# Patient Record
Sex: Female | Born: 1965 | Race: White | Hispanic: No | Marital: Single | Smoking: Never smoker
Health system: Southern US, Community
[De-identification: ages and names within clinical notes are randomized; demographics above are authoritative.]

## PROBLEM LIST (undated history)

## (undated) DIAGNOSIS — G809 Cerebral palsy, unspecified: Secondary | ICD-10-CM

## (undated) DIAGNOSIS — G4730 Sleep apnea, unspecified: Secondary | ICD-10-CM

## (undated) DIAGNOSIS — F419 Anxiety disorder, unspecified: Secondary | ICD-10-CM

## (undated) DIAGNOSIS — F79 Unspecified intellectual disabilities: Secondary | ICD-10-CM

## (undated) DIAGNOSIS — N189 Chronic kidney disease, unspecified: Secondary | ICD-10-CM

## (undated) DIAGNOSIS — R32 Unspecified urinary incontinence: Secondary | ICD-10-CM

## (undated) DIAGNOSIS — K635 Polyp of colon: Secondary | ICD-10-CM

## (undated) DIAGNOSIS — K219 Gastro-esophageal reflux disease without esophagitis: Secondary | ICD-10-CM

## (undated) HISTORY — DX: Unspecified urinary incontinence: R32

## (undated) HISTORY — DX: Anxiety disorder, unspecified: F41.9

## (undated) HISTORY — DX: Chronic kidney disease, unspecified: N18.9

## (undated) HISTORY — DX: Gastro-esophageal reflux disease without esophagitis: K21.9

## (undated) HISTORY — PX: SIGMOIDOSCOPY: SUR1295

## (undated) HISTORY — DX: Polyp of colon: K63.5

---

## 1990-10-28 HISTORY — PX: REPAIR QUADRICEPS / HAMSTRING MUSCLE: SUR1204

## 1991-03-29 HISTORY — PX: BACK SURGERY: SHX140

## 2002-10-28 HISTORY — PX: HIP ADDUCTOR TENOTOMY: SHX1747

## 2003-10-15 ENCOUNTER — Other Ambulatory Visit: Payer: Self-pay

## 2003-10-16 ENCOUNTER — Other Ambulatory Visit: Payer: Self-pay

## 2004-10-28 HISTORY — PX: ABDOMINAL HYSTERECTOMY: SHX81

## 2005-03-15 ENCOUNTER — Emergency Department: Payer: Self-pay | Admitting: General Practice

## 2005-05-17 ENCOUNTER — Emergency Department: Payer: Self-pay | Admitting: Emergency Medicine

## 2005-05-24 ENCOUNTER — Emergency Department: Payer: Self-pay | Admitting: Emergency Medicine

## 2005-06-04 ENCOUNTER — Emergency Department: Payer: Self-pay | Admitting: Emergency Medicine

## 2005-06-12 ENCOUNTER — Ambulatory Visit: Payer: Self-pay

## 2005-07-17 ENCOUNTER — Inpatient Hospital Stay: Payer: Self-pay | Admitting: Unknown Physician Specialty

## 2005-09-08 ENCOUNTER — Emergency Department: Payer: Self-pay | Admitting: Emergency Medicine

## 2005-09-26 ENCOUNTER — Emergency Department: Payer: Self-pay | Admitting: Unknown Physician Specialty

## 2005-09-26 IMAGING — CT CT ABD-PELV W/O CM
1 of 2 series · 15 of 32 positions shown, 19 images · non-contrast
Comparison: none

REASON FOR EXAM: (1) PAIN; (2) PAIN
COMMENTS:

[Series 2: stone · axial · 0.55mm/px · z∈[-589,-262]mm · 15 of 123 slices shown, 19 images]
[im 9/123  soft-tissue]
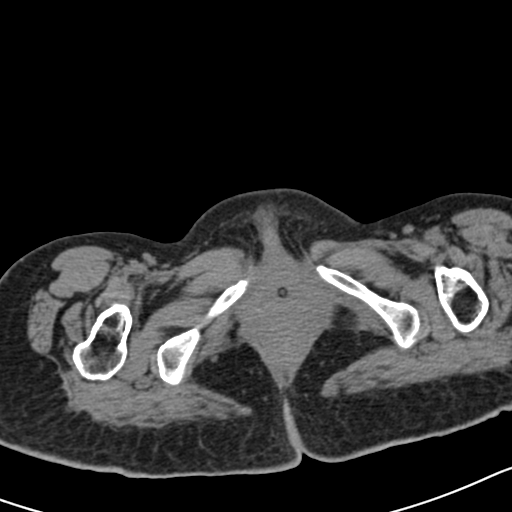
[im 9/123  bone]
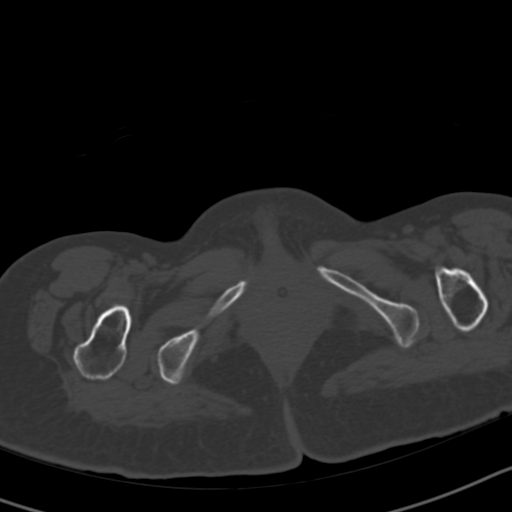
[im 17/123  soft-tissue]
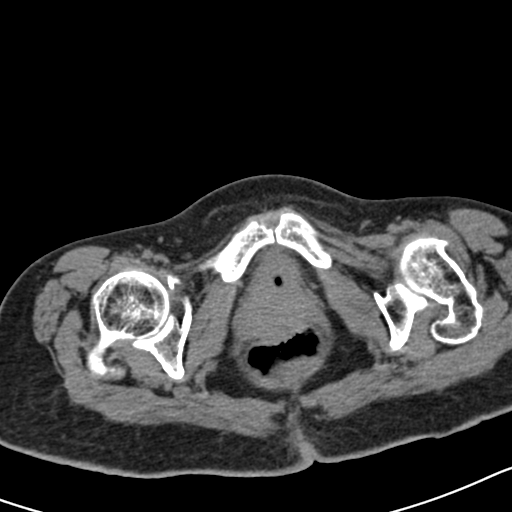
[im 26/123  soft-tissue]
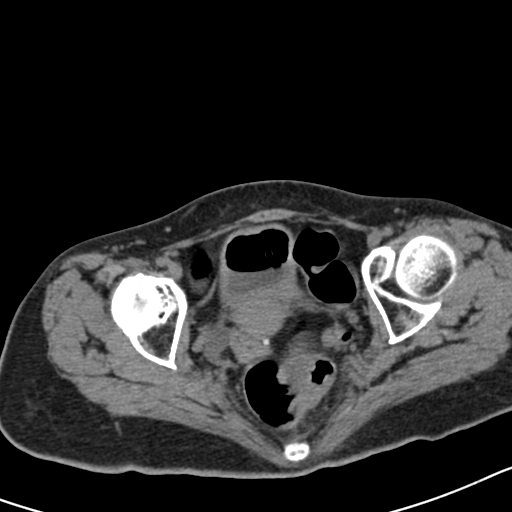
[im 34/123  soft-tissue]
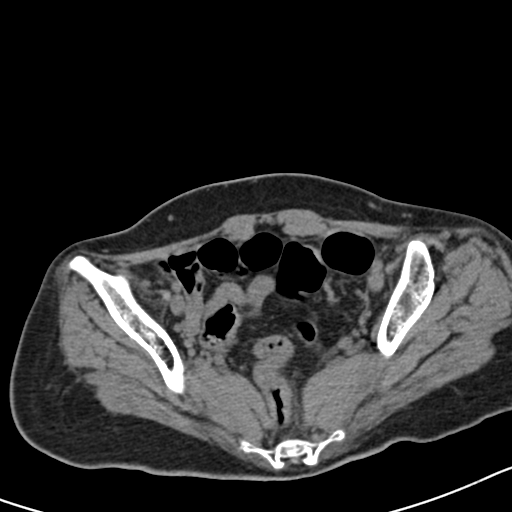
[im 43/123  soft-tissue]
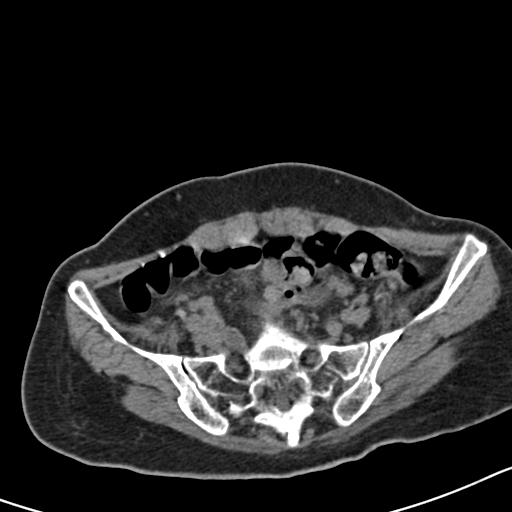
[im 51/123  soft-tissue]
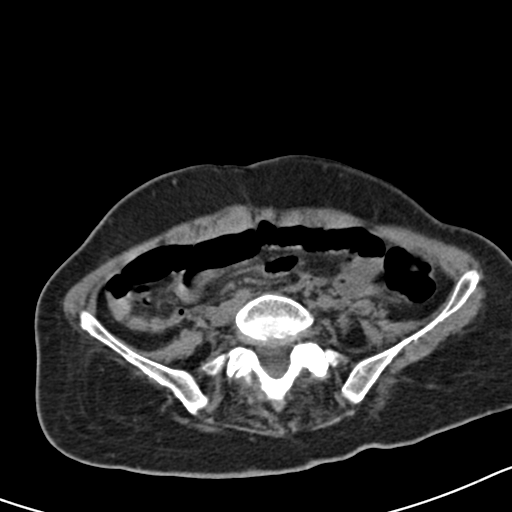
[im 64/123  soft-tissue]
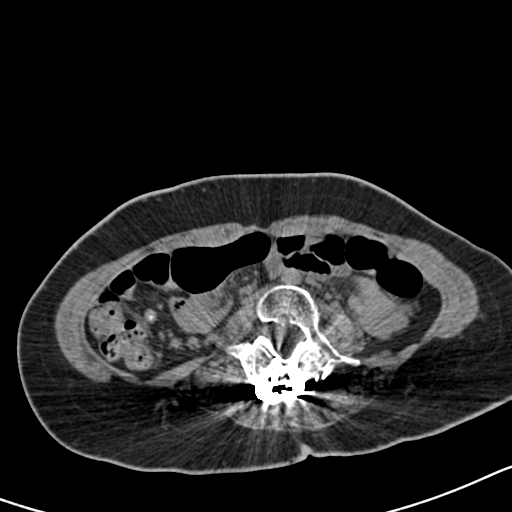
[im 72/123  soft-tissue]
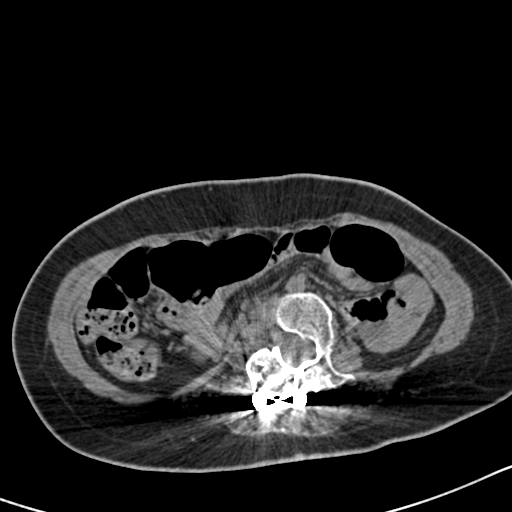
[im 80/123  soft-tissue]
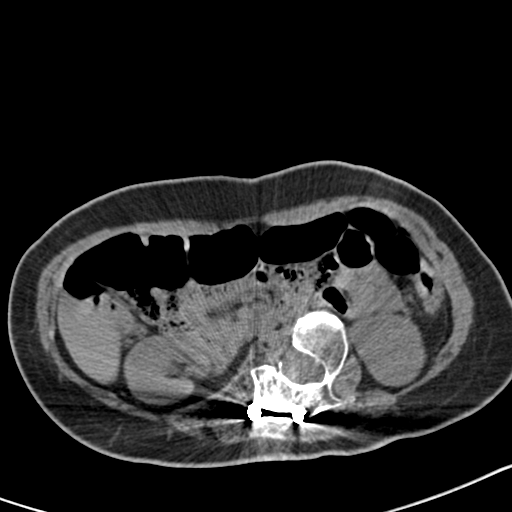
[im 80/123  bone]
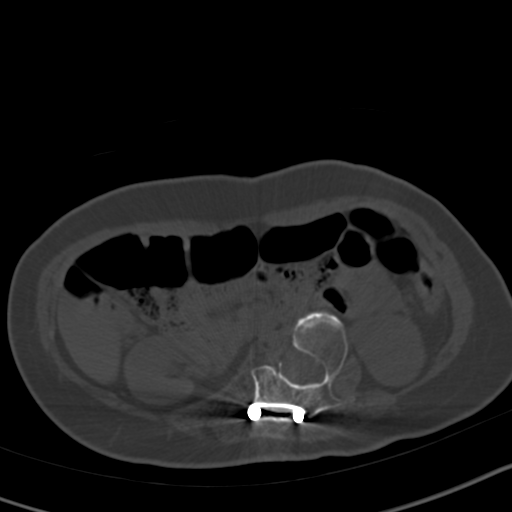
[im 89/123  soft-tissue]
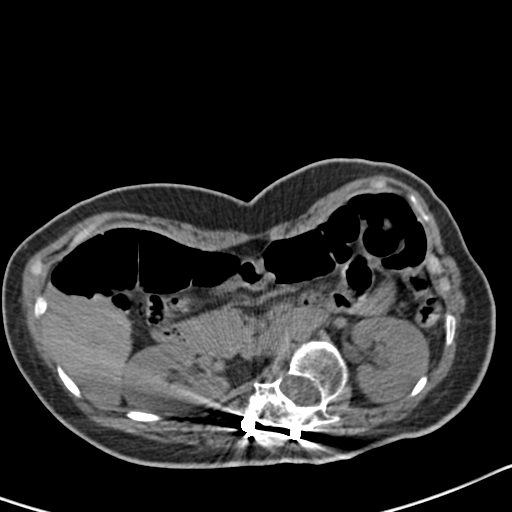
[im 97/123  soft-tissue]
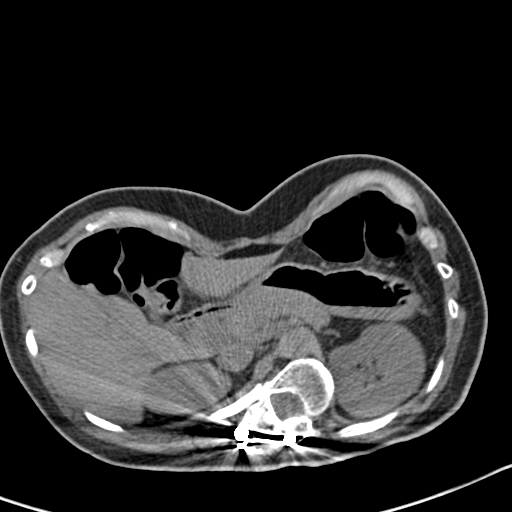
[im 106/123  soft-tissue]
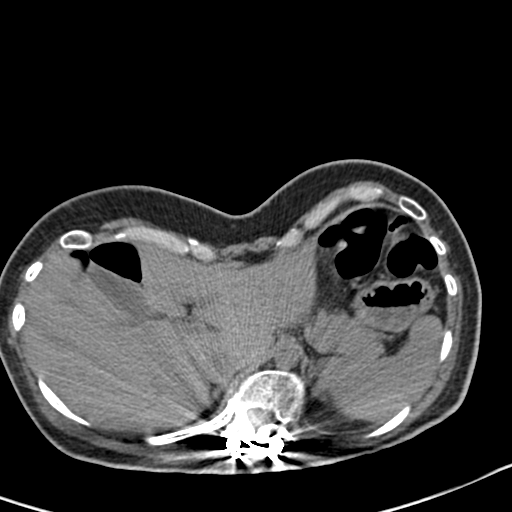
[im 106/123  lung]
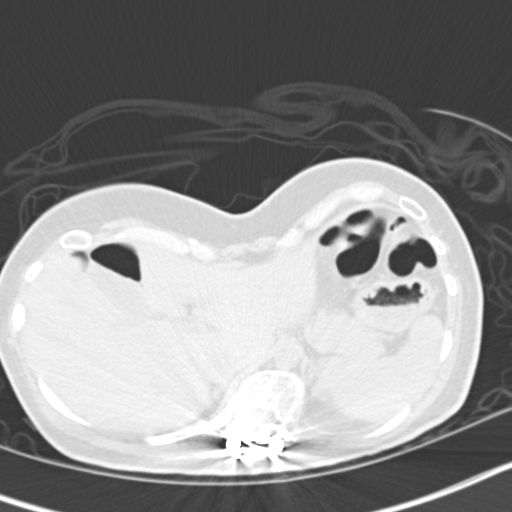
[im 110/123  lung]
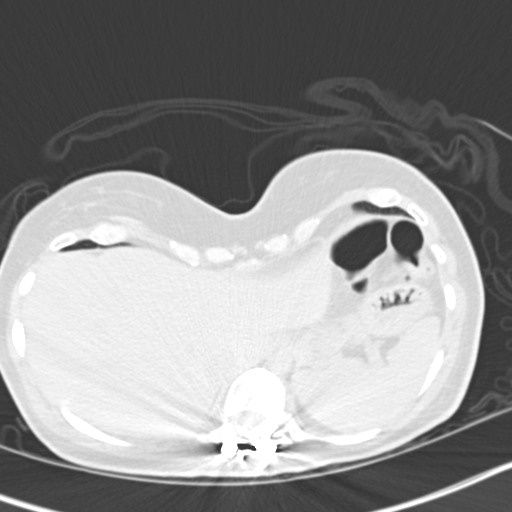
[im 114/123  soft-tissue]
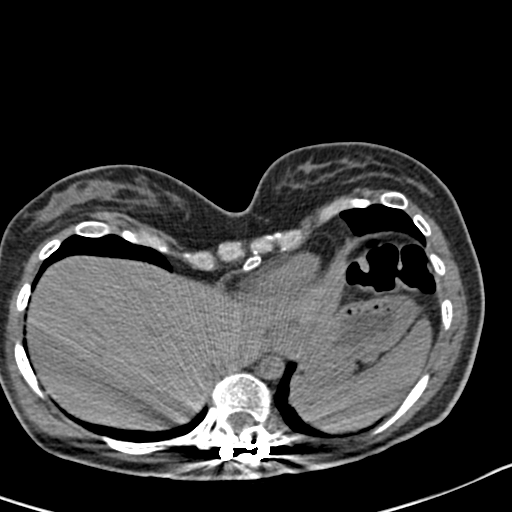
[im 114/123  lung]
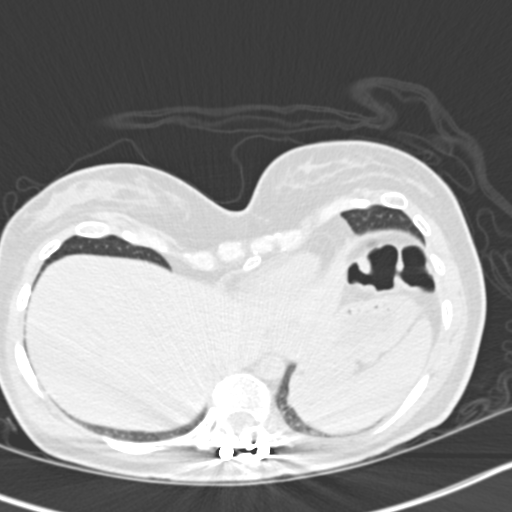
[im 118/123  lung]
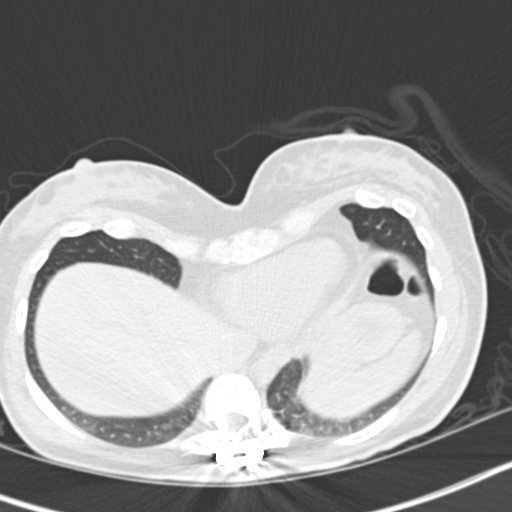

[15 of 32 positions shown; findings below may reference images not displayed]

PROCEDURE:     CT  - CT ABDOMEN AND PELVIS W[DATE]  [DATE]

RESULT:        Standard CT of the abdomen and pelvis was obtained to
evaluate for abdominal pain.  Pectus excavatum deformity is noted of the
sternum.  The liver and spleen are normal.  The pancreas is normal.
Adrenals and kidneys are normal.   Appendicolith is noted in what appears to
be a nonswollen appendix.  No periappendiceal fluid collections are noted.
The abdominal aorta is unremarkable.  Air is noted in the bladder.  This may
be from instrumentation.  The patient has had lumbar spine fusion.
IMPRESSION: 1.     Appendicoliths noted in the appendix.  The appendix is nonswollen.
No periappendiceal fluid collections are noted.
2.     The patient has had lumbar fusion.  No evidence of ureteral stones.
No other focal abnormalities identified.  There is no evidence of bowel
obstruction.

This report was phoned to the Emergency Room physician at the time of the
study.

## 2005-10-06 ENCOUNTER — Emergency Department: Payer: Self-pay | Admitting: Emergency Medicine

## 2006-07-11 ENCOUNTER — Ambulatory Visit: Payer: Self-pay | Admitting: Unknown Physician Specialty

## 2006-08-06 ENCOUNTER — Emergency Department: Payer: Self-pay | Admitting: Emergency Medicine

## 2006-08-06 ENCOUNTER — Other Ambulatory Visit: Payer: Self-pay

## 2006-08-10 ENCOUNTER — Emergency Department: Payer: Self-pay | Admitting: Emergency Medicine

## 2006-08-15 ENCOUNTER — Inpatient Hospital Stay: Payer: Self-pay | Admitting: Internal Medicine

## 2006-08-15 ENCOUNTER — Other Ambulatory Visit: Payer: Self-pay

## 2006-08-23 ENCOUNTER — Inpatient Hospital Stay: Payer: Self-pay | Admitting: Internal Medicine

## 2006-09-21 ENCOUNTER — Emergency Department: Payer: Self-pay | Admitting: Internal Medicine

## 2006-10-10 ENCOUNTER — Ambulatory Visit: Payer: Self-pay | Admitting: Specialist

## 2006-10-22 ENCOUNTER — Emergency Department: Payer: Self-pay | Admitting: General Practice

## 2006-12-28 ENCOUNTER — Emergency Department (HOSPITAL_COMMUNITY): Admission: EM | Admit: 2006-12-28 | Discharge: 2006-12-28 | Payer: Self-pay | Admitting: Emergency Medicine

## 2007-06-30 ENCOUNTER — Emergency Department: Payer: Self-pay | Admitting: Internal Medicine

## 2008-05-20 ENCOUNTER — Other Ambulatory Visit: Payer: Self-pay

## 2008-05-20 ENCOUNTER — Inpatient Hospital Stay: Payer: Self-pay | Admitting: Internal Medicine

## 2008-06-09 ENCOUNTER — Emergency Department: Payer: Self-pay | Admitting: Emergency Medicine

## 2008-07-06 ENCOUNTER — Other Ambulatory Visit: Payer: Self-pay

## 2008-07-06 ENCOUNTER — Emergency Department: Payer: Self-pay | Admitting: Emergency Medicine

## 2008-12-18 ENCOUNTER — Emergency Department (HOSPITAL_COMMUNITY): Admission: EM | Admit: 2008-12-18 | Discharge: 2008-12-18 | Payer: Self-pay | Admitting: Emergency Medicine

## 2009-08-22 ENCOUNTER — Emergency Department: Payer: Self-pay | Admitting: Emergency Medicine

## 2010-01-04 ENCOUNTER — Emergency Department: Payer: Self-pay | Admitting: Unknown Physician Specialty

## 2010-01-11 ENCOUNTER — Emergency Department: Payer: Self-pay | Admitting: Emergency Medicine

## 2010-01-19 ENCOUNTER — Emergency Department: Payer: Self-pay | Admitting: Emergency Medicine

## 2010-02-10 ENCOUNTER — Emergency Department: Payer: Self-pay | Admitting: Emergency Medicine

## 2010-03-09 ENCOUNTER — Emergency Department: Payer: Self-pay | Admitting: Internal Medicine

## 2010-04-15 ENCOUNTER — Emergency Department: Payer: Self-pay

## 2011-01-07 ENCOUNTER — Emergency Department: Payer: Self-pay | Admitting: Emergency Medicine

## 2011-02-12 LAB — URINALYSIS, ROUTINE W REFLEX MICROSCOPIC
Bilirubin Urine: NEGATIVE
Glucose, UA: NEGATIVE mg/dL
Hgb urine dipstick: NEGATIVE
Protein, ur: NEGATIVE mg/dL
Specific Gravity, Urine: 1.015 (ref 1.005–1.030)
Urobilinogen, UA: 0.2 mg/dL (ref 0.0–1.0)

## 2011-02-12 LAB — DIFFERENTIAL
Basophils Absolute: 0.1 10*3/uL (ref 0.0–0.1)
Basophils Relative: 1 % (ref 0–1)
Eosinophils Absolute: 0.1 10*3/uL (ref 0.0–0.7)
Lymphs Abs: 3.5 10*3/uL (ref 0.7–4.0)
Neutro Abs: 2.1 10*3/uL (ref 1.7–7.7)

## 2011-02-12 LAB — CBC
Platelets: 223 10*3/uL (ref 150–400)
WBC: 6.1 10*3/uL (ref 4.0–10.5)

## 2011-02-12 LAB — COMPREHENSIVE METABOLIC PANEL
AST: 48 U/L — ABNORMAL HIGH (ref 0–37)
Albumin: 4.3 g/dL (ref 3.5–5.2)
Alkaline Phosphatase: 71 U/L (ref 39–117)
Chloride: 101 mEq/L (ref 96–112)
GFR calc Af Amer: >60 mL/min (ref >60)
Potassium: 3.9 mEq/L (ref 3.5–5.1)
Sodium: 138 mEq/L (ref 135–145)
Total Bilirubin: 0.9 mg/dL (ref 0.3–1.2)

## 2011-03-01 ENCOUNTER — Ambulatory Visit: Payer: Self-pay | Admitting: Urology

## 2011-03-22 ENCOUNTER — Emergency Department: Payer: Self-pay | Admitting: Emergency Medicine

## 2011-07-17 ENCOUNTER — Ambulatory Visit: Payer: Self-pay | Admitting: Internal Medicine

## 2011-07-17 ENCOUNTER — Other Ambulatory Visit: Payer: Self-pay | Admitting: Internal Medicine

## 2011-09-12 ENCOUNTER — Ambulatory Visit: Payer: Self-pay | Admitting: Gastroenterology

## 2011-12-06 ENCOUNTER — Ambulatory Visit: Payer: Self-pay | Admitting: Family Medicine

## 2012-02-26 ENCOUNTER — Ambulatory Visit: Payer: Self-pay | Admitting: Urology

## 2012-07-24 ENCOUNTER — Emergency Department: Payer: Self-pay | Admitting: Emergency Medicine

## 2012-07-24 LAB — URINALYSIS, COMPLETE
Bilirubin,UR: NEGATIVE
Ketone: NEGATIVE
Nitrite: NEGATIVE
RBC,UR: NONE SEEN /HPF (ref 0–5)
Specific Gravity: 1.012 (ref 1.003–1.030)
Squamous Epithelial: NONE SEEN

## 2012-07-24 LAB — CBC WITH DIFFERENTIAL/PLATELET
Basophil #: 0 10*3/uL (ref 0.0–0.1)
Eosinophil #: 0.1 10*3/uL (ref 0.0–0.7)
HGB: 12.7 g/dL (ref 12.0–16.0)
Lymphocyte %: 58.9 %
MCHC: 33.3 g/dL (ref 32.0–36.0)
Neutrophil %: 32.8 %
RDW: 17 % — ABNORMAL HIGH (ref 11.5–14.5)
WBC: 6.1 10*3/uL (ref 3.6–11.0)

## 2013-02-24 ENCOUNTER — Ambulatory Visit: Payer: Self-pay | Admitting: Urology

## 2013-06-29 ENCOUNTER — Ambulatory Visit: Payer: Self-pay | Admitting: Urology

## 2013-07-15 LAB — HM COLONOSCOPY

## 2014-02-27 ENCOUNTER — Inpatient Hospital Stay: Payer: Self-pay | Admitting: Internal Medicine

## 2014-02-27 LAB — CBC WITH DIFFERENTIAL/PLATELET
Basophil #: 0 10*3/uL (ref 0.0–0.1)
Basophil %: 0.5 %
EOS ABS: 0.1 10*3/uL (ref 0.0–0.7)
EOS PCT: 1.5 %
HCT: 36 % (ref 35.0–47.0)
HGB: 11.9 g/dL — AB (ref 12.0–16.0)
LYMPHS PCT: 47.6 %
Lymphocyte #: 2.9 10*3/uL (ref 1.0–3.6)
MCH: 31.1 pg (ref 26.0–34.0)
MCHC: 33.1 g/dL (ref 32.0–36.0)
MCV: 94 fL (ref 80–100)
MONOS PCT: 7.3 %
Monocyte #: 0.4 x10 3/mm (ref 0.2–0.9)
NEUTROS ABS: 2.6 10*3/uL (ref 1.4–6.5)
Neutrophil %: 43.1 %
Platelet: 217 10*3/uL (ref 150–440)
RBC: 3.82 10*6/uL (ref 3.80–5.20)
RDW: 13.7 % (ref 11.5–14.5)
WBC: 6.1 10*3/uL (ref 3.6–11.0)

## 2014-02-27 LAB — COMPREHENSIVE METABOLIC PANEL
ALK PHOS: 64 U/L
ALT: 17 U/L (ref 12–78)
Albumin: 3.7 g/dL (ref 3.4–5.0)
Anion Gap: 7 (ref 7–16)
BUN: 11 mg/dL (ref 7–18)
Bilirubin,Total: 0.3 mg/dL (ref 0.2–1.0)
CALCIUM: 8.7 mg/dL (ref 8.5–10.1)
CREATININE: 0.56 mg/dL — AB (ref 0.60–1.30)
Chloride: 109 mmol/L — ABNORMAL HIGH (ref 98–107)
Co2: 26 mmol/L (ref 21–32)
EGFR (African American): >60
Glucose: 89 mg/dL (ref 65–99)
OSMOLALITY: 282 (ref 275–301)
POTASSIUM: 4.1 mmol/L (ref 3.5–5.1)
SGOT(AST): 27 U/L (ref 15–37)
Sodium: 142 mmol/L (ref 136–145)
Total Protein: 6.7 g/dL (ref 6.4–8.2)

## 2014-02-27 LAB — URINALYSIS, COMPLETE
BILIRUBIN, UR: NEGATIVE
BLOOD: NEGATIVE
Bacteria: NONE SEEN
Glucose,UR: NEGATIVE mg/dL (ref 0–75)
KETONE: NEGATIVE
Leukocyte Esterase: NEGATIVE
Nitrite: NEGATIVE
PH: 5 (ref 4.5–8.0)
Protein: NEGATIVE
RBC,UR: <1 /HPF (ref 0–5)
Specific Gravity: 1.026 (ref 1.003–1.030)
Squamous Epithelial: NONE SEEN
WBC UR: <1 /HPF (ref 0–5)

## 2014-02-27 LAB — LIPASE, BLOOD: Lipase: 1276 U/L — ABNORMAL HIGH (ref 73–393)

## 2014-02-28 LAB — CBC WITH DIFFERENTIAL/PLATELET
Basophil #: 0 10*3/uL (ref 0.0–0.1)
Basophil %: 0.4 %
EOS PCT: 1.3 %
Eosinophil #: 0.1 10*3/uL (ref 0.0–0.7)
HCT: 33.6 % — ABNORMAL LOW (ref 35.0–47.0)
HGB: 11.1 g/dL — ABNORMAL LOW (ref 12.0–16.0)
LYMPHS ABS: 3.2 10*3/uL (ref 1.0–3.6)
Lymphocyte %: 49.8 %
MCH: 31.4 pg (ref 26.0–34.0)
MCHC: 32.9 g/dL (ref 32.0–36.0)
MCV: 95 fL (ref 80–100)
MONOS PCT: 5.8 %
Monocyte #: 0.4 x10 3/mm (ref 0.2–0.9)
NEUTROS PCT: 42.7 %
Neutrophil #: 2.7 10*3/uL (ref 1.4–6.5)
Platelet: 193 10*3/uL (ref 150–440)
RBC: 3.52 10*6/uL — ABNORMAL LOW (ref 3.80–5.20)
RDW: 13.7 % (ref 11.5–14.5)
WBC: 6.3 10*3/uL (ref 3.6–11.0)

## 2014-02-28 LAB — COMPREHENSIVE METABOLIC PANEL
ALK PHOS: 57 U/L
ALT: 18 U/L (ref 12–78)
ANION GAP: 7 (ref 7–16)
Albumin: 3.3 g/dL — ABNORMAL LOW (ref 3.4–5.0)
BILIRUBIN TOTAL: 0.4 mg/dL (ref 0.2–1.0)
BUN: 9 mg/dL (ref 7–18)
CHLORIDE: 107 mmol/L (ref 98–107)
CO2: 23 mmol/L (ref 21–32)
Calcium, Total: 8.8 mg/dL (ref 8.5–10.1)
Creatinine: 0.33 mg/dL — ABNORMAL LOW (ref 0.60–1.30)
EGFR (African American): >60
Glucose: 65 mg/dL (ref 65–99)
OSMOLALITY: 271 (ref 275–301)
POTASSIUM: 4 mmol/L (ref 3.5–5.1)
SGOT(AST): 23 U/L (ref 15–37)
Sodium: 137 mmol/L (ref 136–145)
TOTAL PROTEIN: 6 g/dL — AB (ref 6.4–8.2)

## 2014-02-28 LAB — HEMOGLOBIN A1C: Hemoglobin A1C: 4.9 % (ref 4.2–6.3)

## 2014-02-28 LAB — LIPID PANEL
Cholesterol: 102 mg/dL (ref 0–200)
HDL Cholesterol: 41 mg/dL (ref 40–60)
LDL CHOLESTEROL, CALC: 48 mg/dL (ref 0–100)
Triglycerides: 64 mg/dL (ref 0–200)
VLDL Cholesterol, Calc: 13 mg/dL (ref 5–40)

## 2014-02-28 LAB — TRIGLYCERIDES: TRIGLYCERIDES: 64 mg/dL (ref 0–200)

## 2014-02-28 LAB — LIPASE, BLOOD: LIPASE: 295 U/L (ref 73–393)

## 2014-08-03 ENCOUNTER — Emergency Department: Payer: Self-pay | Admitting: Emergency Medicine

## 2014-08-03 LAB — CBC
HCT: 35.6 % (ref 35.0–47.0)
HGB: 11.1 g/dL — ABNORMAL LOW (ref 12.0–16.0)
MCH: 29 pg (ref 26.0–34.0)
MCHC: 31.3 g/dL — ABNORMAL LOW (ref 32.0–36.0)
MCV: 93 fL (ref 80–100)
PLATELETS: 264 10*3/uL (ref 150–440)
RBC: 3.83 10*6/uL (ref 3.80–5.20)
RDW: 14.7 % — AB (ref 11.5–14.5)
WBC: 6.4 10*3/uL (ref 3.6–11.0)

## 2014-08-03 LAB — COMPREHENSIVE METABOLIC PANEL
ALK PHOS: 64 U/L
ANION GAP: 6 — AB (ref 7–16)
AST: 19 U/L (ref 15–37)
Albumin: 3.1 g/dL — ABNORMAL LOW (ref 3.4–5.0)
BUN: 12 mg/dL (ref 7–18)
Bilirubin,Total: <0.1 mg/dL — ABNORMAL LOW (ref 0.2–1.0)
Calcium, Total: 8.7 mg/dL (ref 8.5–10.1)
Chloride: 105 mmol/L (ref 98–107)
Co2: 27 mmol/L (ref 21–32)
Creatinine: 0.46 mg/dL — ABNORMAL LOW (ref 0.60–1.30)
EGFR (African American): >60
EGFR (Non-African Amer.): >60
GLUCOSE: 85 mg/dL (ref 65–99)
Osmolality: 275 (ref 275–301)
POTASSIUM: 4 mmol/L (ref 3.5–5.1)
SGPT (ALT): 19 U/L
Sodium: 138 mmol/L (ref 136–145)
TOTAL PROTEIN: 6.6 g/dL (ref 6.4–8.2)

## 2014-08-03 LAB — LIPASE, BLOOD: Lipase: 130 U/L (ref 73–393)

## 2014-08-03 LAB — URINALYSIS, COMPLETE
BLOOD: NEGATIVE
Bilirubin,UR: NEGATIVE
Glucose,UR: NEGATIVE mg/dL (ref 0–75)
LEUKOCYTE ESTERASE: NEGATIVE
NITRITE: NEGATIVE
Ph: 7 (ref 4.5–8.0)
Protein: NEGATIVE
SPECIFIC GRAVITY: 1.028 (ref 1.003–1.030)
Squamous Epithelial: 6

## 2014-08-03 LAB — TROPONIN I

## 2014-11-23 ENCOUNTER — Emergency Department: Payer: Self-pay | Admitting: Emergency Medicine

## 2014-11-23 LAB — CBC WITH DIFFERENTIAL/PLATELET
BASOS PCT: 0.4 %
Basophil #: 0 10*3/uL (ref 0.0–0.1)
EOS PCT: 0 %
Eosinophil #: 0 10*3/uL (ref 0.0–0.7)
HCT: 38.2 % (ref 35.0–47.0)
HGB: 12.1 g/dL (ref 12.0–16.0)
LYMPHS ABS: 1.8 10*3/uL (ref 1.0–3.6)
Lymphocyte %: 26.5 %
MCH: 28.7 pg (ref 26.0–34.0)
MCHC: 31.6 g/dL — AB (ref 32.0–36.0)
MCV: 91 fL (ref 80–100)
MONO ABS: 0.7 x10 3/mm (ref 0.2–0.9)
Monocyte %: 11 %
Neutrophil #: 4.2 10*3/uL (ref 1.4–6.5)
Neutrophil %: 62.1 %
Platelet: 264 10*3/uL (ref 150–440)
RBC: 4.21 10*6/uL (ref 3.80–5.20)
RDW: 14.6 % — ABNORMAL HIGH (ref 11.5–14.5)
WBC: 6.8 10*3/uL (ref 3.6–11.0)

## 2014-11-23 LAB — BASIC METABOLIC PANEL
Anion Gap: 10 (ref 7–16)
BUN: 39 mg/dL — ABNORMAL HIGH (ref 7–18)
CREATININE: 0.54 mg/dL — AB (ref 0.60–1.30)
Calcium, Total: 9.2 mg/dL (ref 8.5–10.1)
Chloride: 105 mmol/L (ref 98–107)
Co2: 25 mmol/L (ref 21–32)
GLUCOSE: 58 mg/dL — AB (ref 65–99)
Osmolality: 287 (ref 275–301)
Potassium: 4.3 mmol/L (ref 3.5–5.1)
SODIUM: 140 mmol/L (ref 136–145)

## 2014-11-23 LAB — URINALYSIS, COMPLETE
Bilirubin,UR: NEGATIVE
Blood: NEGATIVE
Glucose,UR: NEGATIVE mg/dL (ref 0–75)
Hyaline Cast: 3
LEUKOCYTE ESTERASE: NEGATIVE
Nitrite: NEGATIVE
Ph: 5 (ref 4.5–8.0)
Specific Gravity: 1.028 (ref 1.003–1.030)
Squamous Epithelial: <1

## 2014-11-25 LAB — URINE CULTURE

## 2014-12-09 ENCOUNTER — Emergency Department: Payer: Self-pay | Admitting: Emergency Medicine

## 2015-02-18 NOTE — Discharge Summary (Signed)
PATIENT NAME:  Loretta Savage, Loretta Savage MR#:  [id number] DATE OF BIRTH:  Apr 25, 1966  DATE OF ADMISSION:  02/27/2014 DATE OF DISCHARGE:  05/04/20105  ADMISSION DIAGNOSIS: Abdominal pain with acute pancreatitis.  DISCHARGE DIAGNOSES:  1. Acute pancreatitis.  2. Cerebral palsy.  3. Urinary retention.   CONSULTATIONS: GI.   DISCHARGE LABORATORY DATA: Lipase 295. White blood cells 6.3, hemoglobin 11, hematocrit 34, platelets are 193. Sodium 137, potassium 4.0, chloride 107, bicarbonate 23, BUN 9,  creatinine 0.03, glucose 65.   LFTs were within normal limits.   Triglyceride level was within normal limits at 64.  A1c is  4.9.   Abdominal ultrasound showed no evidence of cholecystitis or gallbladder disease.  HOSPITAL COURSE:  A 49 year old female was nonverbal with cerebral palsy presented with abdominal pain. For further details, please refer to H and P.  1. Abdominal pain with acute pancreatitis. The patient was admitted for acute pancreatitis. Her lipase level was 1200 on admission. She was thought to have pancreatitis. She was treated with supportive care for pancreatitis. Her lipase is normal. GI was consulted. Her abdominal ultrasound was normal. Her triglyceride level is normal. Unclear etiology; however, the patient's abdominal pain has improved. She is nonverbal; however, when she grimaces that means she is in pain and she is having no grimacing at this time. She will continue her outpatient medications and her outpatient diet.  2. Urinary retention. The patient will continue outpatient medications.  3. Cerebral palsy. The patient will continue her outpatient medications.   DISCHARGE MEDICATIONS: 1. Xanax 0.5 mg q.8 hours p.r.n.   2. Sucralfate 1 gram before meals and at bedtime.  3. Docusate sodium 50 mg b.i.d.  4. Montelukast 10 mg at bedtime.  5. Tamsulosin 0.4 mg daily.  6. Elmiron 100 mg t.i.d.  7. Macrobid 100 mg b.i.d.  8. Levocetirizine 5 mg at bedtime.  9. Meloxicam 7.5 mg  daily.  10. Terazosin 2 mg at bedtime.  11. Cetirizine 10 mg daily.  12. Estradiol 0.5 mg daily.  13.   0.1 ophthalmic solution t.i.d.  14. Acetaminophen oxycodone 325/5  q. 6 hours p.r.n. pain #25.   DISCHARGE DIET: Regular mechanical soft diet.   DISCHARGE ACTIVITY: As tolerated.   DISCHARGE FOLLOW-UP: The patient followup with Dr. Brynda Greathouse in one week.   The patient is medically stable for discharge.  TIME SPENT:  35 minutes.   ____________________________ Donell Beers. Benjie Karvonen, MD spm:sg D: 02/28/2014 13:34:44 ET T: 02/28/2014 14:18:55 ET JOB#: [id number]  cc: Dorthie Santini P. Benjie Karvonen, MD, <Dictator> Donell Beers Imre Vecchione MD ELECTRONICALLY SIGNED 02/28/2014 14:45

## 2015-02-18 NOTE — H&P (Signed)
PATIENT NAME:  Loretta Savage, Loretta Savage MR#:  [id number] DATE OF BIRTH:  [birth year]  DATE OF ADMISSION:  02/27/2014  REFERRING PHYSICIAN: Dr. Corky Downs  FAMILY PHYSICIAN: Dr. Brynda Greathouse  REASON FOR ADMISSION: Abdominal pain.   HISTORY OF PRESENT ILLNESS: The patient is a 49 year old female with a history of cerebral palsy with cognitive impairment. Also has history of allergies and asthma. Presents to the Emergency Room with acute abdominal pain. In the Emergency Room, the patient was noted to be in severe distress. She was given morphine, with improvement of her pain. Labs revealed an elevated lipase consistent with pancreatitis. She is now admitted for further evaluation.   PAST MEDICAL HISTORY: 1.  Cerebral palsy.  2.  Scoliosis.  3.  Nephrolithiasis.  4.  Status post hysterectomy.  5.  Status post back surgery.  6.  Environmental allergies.  7.  Asthma/reactive airways disease.   MEDICATIONS: 1.  Carafate 1 gram p.o. before meals and at bedtime.  2.  Singulair 10 mg p.o. daily.  3.  Sertraline 50 mg p.o. daily.  4.  Zofran 8 mg p.o. q. 8 hours p.r.n.  5.  Nasonex 2 puffs in each nostril daily.  6.  Xyzal 5 mg p.o. daily.  7.  Estrace 0.5 mg p.o. daily.  8.  Canasa suppositories 1 gram per rectum at bedtime. 9.  Xanax 0.5 mg p.o. q. 8 hours p.r.n.  10.  Percocet 1 p.o. q. 6 hours p.r.n. pain.   ALLERGIES: PENICILLIN, NEOSPORIN, CORTISPORIN.   SOCIAL HISTORY: Negative for alcohol or tobacco abuse.   FAMILY HISTORY: Positive for diabetes and hypertension.   REVIEW OF SYSTEMS:  Unable to obtain from patient.   PHYSICAL EXAMINATION: GENERAL: The patient currently in no acute distress.  VITAL SIGNS: Remarkable for a blood pressure of 145/63, heart rate 106, respiratory rate 20, temperature 98.3, sat 94% on room air.  HEENT: Normocephalic, atraumatic. Pupils equally round and reactive to light and accommodation. Extraocular movements are intact. Sclerae not icteric. Conjunctivae are clear.   Oropharynx is dry, but clear.  NECK: Supple, without JVD. No adenopathy or thyromegaly is noted.  LUNGS: Reveal scattered rhonchi, without wheezes or rales. No dullness. Respiratory effort is normal.  CARDIAC EXAM: Reveals a rapid rate, with a regular rhythm. Normal S1, S2. No significant rubs or gallops. PMI is nondisplaced. Chest wall is nontender.  ABDOMEN: Soft, but tender diffusely. There is guarding, but no rebound. Normoactive bowel sounds. No organomegaly or masses were appreciated. No hernias or bruits were noted.  EXTREMITIES: Revealed stasis changes, with trace edema. Pulses were 2+ bilaterally.  SKIN: Warm and dry, without rash or lesions.  NEUROLOGIC EXAM: Revealed cranial nerves II through XII grossly intact. Deep tendon reflexes were symmetric.  PSYCHIATRIC: Exam was unable to be performed.   LABORATORY DATA: KUB revealed constipation. Urinalysis was unremarkable. Her white count was 6.1, with a hemoglobin of 11.9. Lipase was 1276, with a glucose of 89, BUN of 11, creatinine of 0.56, with a sodium of 142 and a potassium of 4.1.   ASSESSMENT: 1.  Acute pancreatitis.  2.  Abdominal pain.  3.  Tachycardia.  4.  Anemia of chronic disease.  5.  Constipation.   PLAN: The patient will be admitted to the floor and kept n.p.o. except for ice chips and medications. Begin IV fluids and empiric IV antibiotics. Will obtain an abdominal ultrasound and consult GI. Will check her lipid status, as well as an A1c. Follow up routine labs in the morning. Will use  Colace for constipation. Further treatment and evaluation will depend upon the patient's progress.   Total time spent on this patient was 45 minutes.    ____________________________ Leonie Douglas Doy Hutching, MD jds:mr D: 02/27/2014 19:30:28 ET T: 02/27/2014 20:20:29 ET JOB#: [id number]  cc: Leonie Douglas. Doy Hutching, MD, <Dictator> Mikeal Hawthorne. Brynda Greathouse, MD  Ruari Duggan Lennice Sites MD ELECTRONICALLY SIGNED 02/28/2014 12:42

## 2015-02-18 NOTE — Consult Note (Signed)
Brief Consult Note: Diagnosis: Acute pancreatitis.   Patient was seen by consultant.   Comments: Ms. Burck is a 49 y/o caucasian female with cerebral palsy & mental challenges who presents with 1st episode mild acute pancreatitis.  She has multiple new medications which could be culprit.  No hx ETOH or hypertriglyceridemia.  Lipase now normal.    Plan: 1) Agree with supportive measures 2) Gradually advance diet to liquids 3) triglycerides 4) pt should follow up with Dr Gustavo Lah if another episode Thanks for allowing [place] to participate in her care.  Please see full dictated note. #[id number].  Electronic Signatures: Andria Meuse (NP)  (Signed [phone] 20:31)  Authored: Brief Consult Note   Last Updated: 04-May-15 20:31 by Andria Meuse (NP)

## 2015-02-18 NOTE — Consult Note (Signed)
PATIENT NAME:  Loretta Savage, Loretta Savage MR#:  [id number] DATE OF BIRTH:  June 16, 1966  DATE OF CONSULTATION:  02/28/2014  REFERRING PHYSICIAN:  Dr. Benjie Karvonen CONSULTING PHYSICIAN:  Andria Meuse, NP and Lucilla Lame, MD   PRIMARY CARE PHYSICIAN:  Dr. Brynda Greathouse.  PRIMARY GASTROENTEROLOGIST:  Dr. Gustavo Lah at [facility].   REASON FOR CONSULTATION:  Acute pancreatitis.   HISTORY OF PRESENT ILLNESS:  [name] is a pleasant 49 year old Caucasian female with a history of being mentally challenged and cerebral palsy. She has a history of chronic GERD with last EGD July 2009 by Dr. Gustavo Lah that showed LA grade C esophagitis and gastropathy, as well as a tortuous esophagus. She presented to the Emergency Room with acute abdominal pain. Her mother, who is her caregiver, states that she has a history of interstitial cystitis and had been at [facility] April 27th and had Botox injections that she gets in her arms, and she had had several doses of Tylenol, as well as some hydrocodone for pain. She had had also multiple other medication changes, although her mom cannot describe exactly what medications were new. Her admission lipase was 1276, down to 295 today. Her hemoglobin was 11.9, down to 11.1 today. She has not had any nausea or vomiting today. Denies any diarrhea, fever or chills. Her LFTs are normal. Her ultrasound shows the common bile duct was not visualized, so the exam was limited by the patient's ability to cooperate with positioning and breath holding, but the gallbladder appeared normal. She had a colonoscopy at [facility] in September 2014 by Dr. Henrene Pastor and had polypectomy. Her next colonoscopy is due in 5 years. There is no history of hypertriglyceridemia and she does not drink alcohol. Her mother believes her weight has remained stable.   PAST MEDICAL AND SURGICAL HISTORY: Erosive esophagitis as described above, asthma, allergies, cerebral palsy, mentally challenged, iron deficiency anemia, IBS, renal lithiasis,  interstitial cystitis, urinary tract infections, colonic adenomatous polyps, complete hysterectomy, spinal rod, foot surgery, hip surgery, eye surgery, nose surgery.   MEDICATIONS PRIOR TO ADMISSION: Acetaminophen/oxycodone 325/5 mg q.6 hours p.r.n., alprazolam 0.5 mg q.8 hours p.r.n., cetirizine 10 mg daily, docusate sodium 50 mg b.i.d., Elmiron 100 mg t.i.d., estradiol 0.5 mg daily, levocetirizine 5 mg at bedtime, Macrobid 100 mg b.i.d., meloxicam 7.5 mg daily, montelukast 10 mg at bedtime, olopatadine 0.1 ophthalmic solution t.i.d., sucralfate 1 gram at bedtime, tamsulosin 0.4 mg daily, terazosin 2 mg at bedtime.   ALLERGIES: CORTISPORIN, ITCHING AND RASH. LEVAQUIN, SWELLING AND RASH. NEO/POLY B/HC, ITCHING AND RASH. NEOSPORIN, [employer]. PENICILLIN CAUSES RASH.   FAMILY HISTORY:  Positive for coronary artery disease. No known family history of colorectal carcinoma, liver or chronic GI problems or pancreatic problems.   SOCIAL HISTORY:  She resides with her mom. She does not have any siblings. She goes to a day program from 7:30 to 4:00 and has a home health aide pretty much throughout the day and evening hours.   REVIEW OF SYSTEMS:  Unable to obtain from the patient.   PHYSICAL EXAMINATION: VITAL SIGNS:  Temp 97.6, pulse 92, respirations 18, blood pressure 118/88, O2 sat 95% on room air.  GENERAL:  She is a thin, mentally challenged Caucasian female, who is alert, cooperative, but unable to answer my questions.  HEENT:  Sclerae clear, anicteric. Conjunctivae pink. Oropharynx pink and moist.  NECK:  Supple without any mass or thyromegaly.  CHEST:  Heart regular rate and rhythm. Normal S1, S2 without murmurs, clicks, rubs or gallops.  LUNGS:  Clear to auscultation bilaterally.  ABDOMEN:  Positive bowel sounds x 4. No bruits auscultated. Abdomen is soft, nontender, nondistended, without palpable mass or hepatosplenomegaly. No rebound tenderness or guarding.  EXTREMITIES:  She has  braces on both upper extremities. Lower extremities with feet with external rotation.   LABORATORY STUDIES: Creatinine 0.33, otherwise normal basic metabolic panel. Triglycerides 64. Hemoglobin A1c 4.9. Albumin 3.3, total protein 6, otherwise normal LFTs. White blood cell count 6.3, platelets 193.   IMPRESSION:  Ms. Hieronymus is a 49 year old Caucasian female with cerebral palsy and mental challenges who presents with first episode of mild acute pancreatitis. She has multiple new medications, which could be a culprit. No history of alcohol use or hypertriglyceridemia. Lipase is now normal.  PLAN: 1.  Agree with supportive measures.  2.  Gradually advance diet to liquids.  3.  Check triglycerides.  4.  The patient should follow up with Dr. Gustavo Lah if another episode occurs and consider medications.   Thank you for allowing [place] to participate in the care.    ____________________________ Andria Meuse, NP klj:dmm D: 02/28/2014 20:30:57 ET T: 02/28/2014 21:09:19 ET JOB#: [id number]  cc: Andria Meuse, NP, <Dictator> Lollie Sails, MD Andria Meuse FNP ELECTRONICALLY SIGNED 03/08/2014 11:08

## 2016-12-03 DIAGNOSIS — Z23 Encounter for immunization: Secondary | ICD-10-CM | POA: Diagnosis not present

## 2016-12-13 DIAGNOSIS — R6889 Other general symptoms and signs: Secondary | ICD-10-CM | POA: Diagnosis not present

## 2016-12-26 DIAGNOSIS — G804 Ataxic cerebral palsy: Secondary | ICD-10-CM | POA: Diagnosis not present

## 2017-03-31 DIAGNOSIS — J209 Acute bronchitis, unspecified: Secondary | ICD-10-CM | POA: Diagnosis not present

## 2017-04-05 DIAGNOSIS — J019 Acute sinusitis, unspecified: Secondary | ICD-10-CM | POA: Diagnosis not present

## 2017-04-05 DIAGNOSIS — J209 Acute bronchitis, unspecified: Secondary | ICD-10-CM | POA: Diagnosis not present

## 2017-04-05 DIAGNOSIS — B9689 Other specified bacterial agents as the cause of diseases classified elsewhere: Secondary | ICD-10-CM | POA: Diagnosis not present

## 2017-05-05 DIAGNOSIS — R300 Dysuria: Secondary | ICD-10-CM | POA: Diagnosis not present

## 2017-05-05 DIAGNOSIS — G809 Cerebral palsy, unspecified: Secondary | ICD-10-CM | POA: Diagnosis not present

## 2017-05-05 DIAGNOSIS — G8250 Quadriplegia, unspecified: Secondary | ICD-10-CM | POA: Diagnosis not present

## 2017-08-12 DIAGNOSIS — G804 Ataxic cerebral palsy: Secondary | ICD-10-CM | POA: Diagnosis not present

## 2017-08-25 DIAGNOSIS — G8250 Quadriplegia, unspecified: Secondary | ICD-10-CM | POA: Diagnosis not present

## 2017-08-25 DIAGNOSIS — Z6828 Body mass index (BMI) 28.0-28.9, adult: Secondary | ICD-10-CM | POA: Diagnosis not present

## 2017-08-25 DIAGNOSIS — G809 Cerebral palsy, unspecified: Secondary | ICD-10-CM | POA: Diagnosis not present

## 2017-08-25 DIAGNOSIS — M1990 Unspecified osteoarthritis, unspecified site: Secondary | ICD-10-CM | POA: Diagnosis not present

## 2017-11-24 DIAGNOSIS — G808 Other cerebral palsy: Secondary | ICD-10-CM | POA: Diagnosis not present

## 2017-11-24 DIAGNOSIS — G8250 Quadriplegia, unspecified: Secondary | ICD-10-CM | POA: Diagnosis not present

## 2018-02-04 ENCOUNTER — Ambulatory Visit (INDEPENDENT_AMBULATORY_CARE_PROVIDER_SITE_OTHER): Payer: Medicare Other | Admitting: Family Medicine

## 2018-02-04 ENCOUNTER — Encounter: Payer: Self-pay | Admitting: Family Medicine

## 2018-02-04 ENCOUNTER — Other Ambulatory Visit: Payer: Self-pay | Admitting: Family Medicine

## 2018-02-04 VITALS — BP 118/70 | HR 70 | Wt 114.0 lb

## 2018-02-04 DIAGNOSIS — G800 Spastic quadriplegic cerebral palsy: Secondary | ICD-10-CM

## 2018-02-04 DIAGNOSIS — F99 Mental disorder, not otherwise specified: Secondary | ICD-10-CM | POA: Diagnosis not present

## 2018-02-04 DIAGNOSIS — F419 Anxiety disorder, unspecified: Secondary | ICD-10-CM

## 2018-02-04 DIAGNOSIS — K219 Gastro-esophageal reflux disease without esophagitis: Secondary | ICD-10-CM

## 2018-02-04 DIAGNOSIS — N3010 Interstitial cystitis (chronic) without hematuria: Secondary | ICD-10-CM | POA: Insufficient documentation

## 2018-02-04 DIAGNOSIS — M150 Primary generalized (osteo)arthritis: Secondary | ICD-10-CM

## 2018-02-04 DIAGNOSIS — F5105 Insomnia due to other mental disorder: Secondary | ICD-10-CM

## 2018-02-04 DIAGNOSIS — F79 Unspecified intellectual disabilities: Secondary | ICD-10-CM

## 2018-02-04 DIAGNOSIS — Z79899 Other long term (current) drug therapy: Secondary | ICD-10-CM

## 2018-02-04 DIAGNOSIS — M159 Polyosteoarthritis, unspecified: Secondary | ICD-10-CM | POA: Insufficient documentation

## 2018-02-04 DIAGNOSIS — Z7689 Persons encountering health services in other specified circumstances: Secondary | ICD-10-CM | POA: Diagnosis not present

## 2018-02-04 DIAGNOSIS — R799 Abnormal finding of blood chemistry, unspecified: Secondary | ICD-10-CM

## 2018-02-04 DIAGNOSIS — F72 Severe intellectual disabilities: Secondary | ICD-10-CM | POA: Insufficient documentation

## 2018-02-04 DIAGNOSIS — J302 Other seasonal allergic rhinitis: Secondary | ICD-10-CM | POA: Diagnosis not present

## 2018-02-04 DIAGNOSIS — G4700 Insomnia, unspecified: Secondary | ICD-10-CM | POA: Insufficient documentation

## 2018-02-04 DIAGNOSIS — D509 Iron deficiency anemia, unspecified: Secondary | ICD-10-CM

## 2018-02-04 MED ORDER — ALPRAZOLAM 0.5 MG PO TABS: 0.5000 mg | ORAL_TABLET | Freq: Two times a day (BID) | ORAL | 5 refills | Status: DC

## 2018-02-04 MED ORDER — MONTELUKAST SODIUM 10 MG PO TABS: 10.0000 mg | ORAL_TABLET | Freq: Every day | ORAL | 11 refills | Status: DC

## 2018-02-04 MED ORDER — DOXAZOSIN MESYLATE 2 MG PO TABS: 2.0000 mg | ORAL_TABLET | Freq: Every day | ORAL | 11 refills | Status: DC

## 2018-02-04 MED ORDER — MELOXICAM 15 MG PO TABS: ORAL_TABLET | ORAL | 11 refills | Status: DC

## 2018-02-04 MED ORDER — ELMIRON 100 MG PO CAPS: ORAL_CAPSULE | ORAL | 5 refills | Status: DC

## 2018-02-04 MED ORDER — TRAZODONE HCL 50 MG PO TABS: 50.0000 mg | ORAL_TABLET | Freq: Every day | ORAL | 11 refills | Status: DC

## 2018-02-04 NOTE — Assessment & Plan Note (Signed)
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

## 2018-02-04 NOTE — Assessment & Plan Note (Signed)
Refill meds, singulair flonase

## 2018-02-04 NOTE — Assessment & Plan Note (Signed)
Chronic problem, in setting of congenital cerebral palsy Followed by [facility] for botox injections q 4 months Non ambulatory, Has brace supports for legs, s/p prior surgeries for muscle tenotomy Cognitive and intellectual developmental delay as well in setting of CP Continues multi symptom management on meds 

## 2018-02-04 NOTE — Assessment & Plan Note (Addendum)
Stable chronic problem Followed by [facility] Controlled on Carafate and Dexilant

## 2018-02-04 NOTE — Assessment & Plan Note (Signed)
Chronic problem, multiple joints including shoulders Secondary to underlying developmental delay / disorder and CP Refilled Meloxicam 15mg  daily Followed by [facility]

## 2018-02-04 NOTE — Assessment & Plan Note (Signed)
Relatively stable, controlled on BDZ daily Secondary to developmental / intellectual delay with CP Refilled Alprazolam today 0.5mg  BID, with refills Follow-up

## 2018-02-04 NOTE — Assessment & Plan Note (Signed)
Relatively stable, controlled on BDZ daily Secondary to developmental / intellectual delay with CP Refilled Trazodone today 50mg  at night, with refills Follow-up

## 2018-02-04 NOTE — Assessment & Plan Note (Addendum)
Currently controlled on Elmiron agent QID Prior work-up from [facility], has had prior dx UTI and nephrolithiasis, ultimately seems to be symptoms secondary to cystitis - Refilled Elmiron 100mg  QID today, rx DAW brand, cost is high but med is effective, has been approved in past - Also on Doxazosin for improve urinary flow

## 2018-02-04 NOTE — Patient Instructions (Addendum)
Thank you for coming to the office today.  All medicines from Dr Brynda Greathouse have been refilled - please make sure pharmacy cancels out his prior orders, to remove excess refills and that they use MY NEW orders - given up to 30 day supply with up to 1 year on most, some including Alprazolam and Elmiron are 30 day supply with 5 refills  Call if need med adjustment or changes or need new rx sent  Keep up with specialists and day program  No changes today  DUE for FASTING BLOOD WORK (no food or drink after midnight before the lab appointment, only water or coffee without cream/sugar on the morning of)  SCHEDULE "Lab Only" visit in the morning at the clinic for lab draw in 6 MONTHS  - Make sure Lab Only appointment is at about 1 week before your next appointment, so that results will be available  For Lab Results, once available within 2-3 days of blood draw, you can can log in to MyChart online to view your results and a brief explanation. Also, we can discuss results at next follow-up visit.   Please schedule a Follow-up Appointment to: Return in about 5 months (around 07/07/2018) for Annual Physical.  If you have any other questions or concerns, please feel free to call the office or send a message through [place]. You may also schedule an earlier appointment if necessary.  Additionally, you may be receiving a survey about your experience at our office within a few days to 1 week by e-mail or mail. We value your feedback.  Nobie Putnam, DO [place]

## 2018-02-04 NOTE — Progress Notes (Signed)
Subjective:    Patient ID: Loretta Savage, female    DOB: 1966/01/23, 52 y.o.   MRN: [id number]  Loretta Savage is a 52 y.o. female presenting on 02/04/2018 for Establish Care and Cerebral Palsy  Previously followed by Dr Nicky Pugh for PCP locally, who has retired. Now she is here to establish care with new provider. Patient is non verbal. She is unable to provide history. Majority of history is provided by her mother, and legal guardian, Indianna Boran.  HPI   Cerebral Palsy, Spastic quadriplegia / Intellectual Disability - Background history provided by patient's mother today. Reports chronic developmental disorder since birth with cerebral palsy and intellectual disability. She has always had limited verbal speech, but is able to occasionally say select words, such as "mama". She does have intact vision and hearing, but limited ability to follow / comprehend commands. She has short stature congenitally with limited arms, and she has had complications with spastic muscle tone in lower extremity with contractures in past, requiring surgeries, see history. - Currently she lives at home with her mother, who is primary caregiver. 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 She has been attending day program since age 35-4, currently goes to [facility] weekdays in [place], day program with variety of opportunities for her  INSOMNIA / Anxiety - reports chronic issues that seem secondary to her developmental disorder. She will occasionally get anxious and has had problems with this in past, her prior PCP had her on a stable regimen with Alprazolam 0.5mg  BID, not taking PRN, seems to  take most days. Also with Trazodone 50mg  nightly, in past was on higher dose up to 1.5 for 75mg  at times, limited change so went back to 50mg  - Needs refills today on Alprazolam and Trazodone for future  Constipation Stool softener OTC Docusate in AM and nightly with Laxative Bisacodyl  Seasonal Environmental Allergies - Required shots before, now improved on singulair and flonase. Needs refill  Osteoarthritis multiple joints Reports that this may be due to her developmental delay and short stature, has had arthritis changes in L shoulder and other joints. Has been stable on NSAID, meloxicam - Prior followed by [facility] - History of Scoliosis (C curve), see surgery for other muscle surgeries  History of PUD with bleeding ulcer / GERD Followed by [facility]. Has had colonoscopy x 2, last done in 2014 with polyp  History of Chronic Interstitial Bladder Cystitis - History of recurrent issues due to urinary tract, had seen Urologist at [facility] for while, ruled out infection and other causes, ultimately prior PCP tried her on Elmiron with good results, and has continued this longer term.  History of a DVT behind knee - determined she had a sore knee and pain, she had testing identified DVT, was on lovenox then warfarin for 1 year, has since resolved, thought to be provoked due to sedentary.  Other specialists - Dr Tami Ribas - ENT - Dr Nehemiah Massed [facility] (Dermatology) for general mole check  Health Maintenance:  Mammogram would have to go to [place], to lay down to do it, last one 10-15 years ago. They are not ready  to pursue repeat mammogram.  Last colonoscopy entered into [facility], done at [facility]  Depression screen [facility] 2/9 02/04/2018  Decreased Interest 0  Down, Depressed, Hopeless 0  PHQ - 2 Score 0    Past Medical History:  Diagnosis Date  . Anxiety   . Chronic kidney disease    stone  . Colon polyp   . GERD (gastroesophageal reflux disease)   .  Urinary incontinence    Past Surgical History:  Procedure Laterality Date  . ABDOMINAL HYSTERECTOMY  2006  . BACK SURGERY  03/1991  . HIP ADDUCTOR TENOTOMY  2004  . REPAIR QUADRICEPS / HAMSTRING MUSCLE  1992   Social History   Socioeconomic History  . Marital status: Single    Spouse name: Not on file  . Number of children: Not on file  . Years of education: Vocational Program  . Highest education level: Associate degree: occupational, Hotel manager, or vocational program  Occupational History  . Not on file  Social Needs  . Financial resource strain: Not on file  . Food insecurity:    Worry: Not on file    Inability: Not on file  . Transportation needs:    Medical: Not on file    Non-medical: Not on file  Tobacco Use  . Smoking status: Never Smoker  . Smokeless tobacco: Never Used  Substance and Sexual Activity  . Alcohol use: Never    Frequency: Never  . Drug use: Never  . Sexual activity: Never  Lifestyle  . Physical activity:    Days per week: Not on file    Minutes per session: Not on file  . Stress: Not on file  Relationships  . Social connections:    Talks on phone: Not on file    Gets together: Not on file    Attends religious service: Not on file    Active member of club or organization: Not on file    Attends meetings of clubs or organizations: Not on file    Relationship status: Not on file  . Intimate partner violence:    Fear of current or ex partner: Not on file    Emotionally abused: Not on file    Physically abused: Not on file    Forced sexual activity: Not on file  Other Topics Concern  . Not on file  Social History Narrative  . Not on file   Family History  Problem Relation Age of Onset  . Colon polyps Mother   . Heart disease Father   . Heart attack Father   . Thyroid disease Maternal Grandmother   . Heart disease Maternal Grandfather   . Heart disease Paternal Grandmother   . Heart disease Paternal Grandfather    Current Outpatient  Medications on File Prior to Visit  Medication Sig  . acetaminophen (TYLENOL) 500 MG tablet Take by mouth.  Marland Kitchen aspirin 81 MG EC tablet Take by mouth.  . bisacodyl (MAGIC BULLETS) 10 MG suppository Place rectally.  . Calcium-Vitamin D 600-200 MG-UNIT tablet Take by mouth.  . Dexlansoprazole (DEXILANT) 30 MG capsule 30 mg daily.  Marland Kitchen docusate sodium (COLACE CLEAR) 50 MG capsule Take by mouth.  . fluticasone (FLONASE) 50 MCG/ACT nasal spray USE 1 SPRAY INTO EACH NOSTRIL TWICE A DAY  . levocetirizine (XYZAL) 5 MG tablet Take 5 mg by mouth daily.  Marland Kitchen loratadine (CLARITIN) 10 MG tablet Take by mouth.  . sucralfate (CARAFATE) 1 g tablet TAKE 1 TABLET BY MOUTH AT 10:30AM AND 1 TABLET AT  BEDTIME EVERY DAY  . nystatin (MYCOSTATIN) 100000 UNIT/ML suspension    No current facility-administered medications on file prior to visit.     Review of Systems  Constitutional: Negative for activity change, appetite change, chills, diaphoresis, fatigue and fever.  HENT: Negative for congestion and rhinorrhea.   Eyes: Negative for visual disturbance.  Respiratory: Negative for apnea, cough, choking, chest tightness, shortness of breath and wheezing.   Cardiovascular: Negative for palpitations and leg swelling.  Gastrointestinal: Positive for constipation. Negative for abdominal pain, anal bleeding, blood in stool, diarrhea, nausea and vomiting.  Genitourinary: Negative for dysuria, frequency and hematuria.  Musculoskeletal: Positive for arthralgias. Negative for neck pain.  Skin: Negative for rash.  Allergic/Immunologic: Positive for environmental allergies.  Neurological: Negative for dizziness, weakness, light-headedness, numbness and headaches.  Hematological: Negative for adenopathy.  Psychiatric/Behavioral: Positive for sleep disturbance. Negative for agitation, behavioral problems and dysphoric mood. The patient is nervous/anxious.    Per HPI unless specifically indicated above     Objective:    BP  118/70   Pulse 70   Wt 114 lb (51.7 kg) Comment: reported per mother/caregiver, most recent wt, limited by wc  LMP  (LMP Unknown)   Wt Readings from Last 3 Encounters:  02/04/18 114 lb (51.7 kg)    Physical Exam  Constitutional: She appears well-nourished. No distress.  Small stature with some dysmorphic features, comfortable, seated in wheelchair  HENT:  Head: Atraumatic.  Moist mucus mem, unable to get complete view of oropharynx due to limited participation  Eyes: Conjunctivae are normal. Right eye exhibits no discharge. Left eye exhibits no discharge.  Neck: No thyromegaly present.  Cardiovascular: Normal rate, regular rhythm, normal heart sounds and intact distal pulses.  No murmur heard. Pulmonary/Chest: Effort normal and breath sounds normal. No respiratory distress. She has no wheezes. She has no rales.  Exam is limited by participation, not following commands for deep breaths, and position change  Abdominal: Bowel sounds are normal. She exhibits no distension. There is no tenderness.  Musculoskeletal: She exhibits deformity (bilateral lower extremity in rigid braces). She exhibits no edema.  Lymphadenopathy:    She has no cervical adenopathy.  Neurological: She is alert. She exhibits abnormal muscle tone (chronic CP).  Does not follow most commands  Skin: Skin is warm and dry. No rash noted. She is not diaphoretic. No erythema.  Psychiatric:  Well groomed, poor eye contact, non verbal, does not appear agitated  Nursing note and vitals reviewed.    Results for orders placed or performed in visit on 02/04/18  [facility] COLONOSCOPY  Result Value Ref Range   [facility] Colonoscopy See Report (in chart) See Report (in chart), Patient Reported      Assessment & Plan:   Problem List Items Addressed This Visit    Anxiety    Relatively stable, controlled on BDZ daily Secondary to developmental / intellectual delay with CP Refilled Alprazolam today 0.5mg  BID, with refills Follow-up        Relevant Medications   traZODone (DESYREL) 50 MG tablet   ALPRAZolam (XANAX) 0.5 MG tablet   Chronic interstitial cystitis    Currently controlled on Elmiron agent QID Prior work-up from [facility], has had prior dx UTI and nephrolithiasis, ultimately seems to be symptoms secondary to cystitis - Refilled Elmiron 100mg  QID today, rx DAW brand, cost is high but med is effective, has been approved in past - Also on Doxazosin for improve urinary flow       Relevant Medications  ELMIRON 100 MG capsule   doxazosin (CARDURA) 2 MG tablet   GERD (gastroesophageal reflux disease)    Stable chronic problem Followed by [facility] Controlled on Carafate and Dexilant      Relevant Medications   sucralfate (CARAFATE) 1 g tablet   Dexlansoprazole (DEXILANT) 30 MG capsule   docusate sodium (COLACE CLEAR) 50 MG capsule   bisacodyl (MAGIC BULLETS) 10 MG suppository   Insomnia    Relatively stable, controlled on BDZ daily Secondary to developmental / intellectual delay with CP Refilled Trazodone today 50mg  at night, with refills Follow-up      Relevant Medications   traZODone (DESYREL) 50 MG tablet   Intellectual disability    Stable chronic problem, 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      Relevant Medications   ALPRAZolam (XANAX) 0.5 MG tablet   Primary osteoarthritis involving multiple joints    Chronic problem, multiple joints including shoulders Secondary to underlying developmental delay / disorder and CP Refilled Meloxicam 15mg  daily Followed by [facility]      Relevant Medications   aspirin 81 MG EC tablet   acetaminophen (TYLENOL) 500 MG tablet   meloxicam (MOBIC) 15 MG tablet   Seasonal allergies    Refill meds, singulair flonase      Relevant Medications   montelukast (SINGULAIR) 10 MG tablet   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 traZODone (DESYREL) 50 MG tablet    Sig: Take 1 tablet (50 mg total) by mouth at bedtime. For sleep    Dispense:  30 tablet    Refill:  11  . montelukast (SINGULAIR) 10 MG tablet    Sig: Take 1 tablet (10 mg total) by mouth at bedtime. For allergies    Dispense:  30 tablet    Refill:  11  . meloxicam (MOBIC) 15 MG tablet    Sig: TAKE 1 TABLET BY MOUTH DAILY FOR ARTHRITIS    Dispense:  30 tablet    Refill:  11  . ELMIRON 100 MG capsule    Sig: TAKE ONE CAPSULE BY MOUTH 4 TIMES A DAY FOR BLADDER    Dispense:  120 capsule    Refill:  5  . doxazosin (CARDURA) 2 MG tablet    Sig: Take 1 tablet (2 mg total) by mouth at bedtime. For bladder.    Dispense:  30 tablet    Refill:  11  . ALPRAZolam (XANAX) 0.5 MG tablet    Sig: Take 1 tablet (0.5 mg total) by mouth 2 (two) times daily. For anxiety    Dispense:  60 tablet    Refill:  5    Follow up plan: Return in about 5 months (around 07/07/2018) for Annual Physical.  Future labs ordered for 07/09/18  Nobie Putnam, DO [facility] 02/04/2018, 11:59 PM

## 2018-02-19 ENCOUNTER — Other Ambulatory Visit: Payer: Self-pay | Admitting: Family Medicine

## 2018-02-19 DIAGNOSIS — J302 Other seasonal allergic rhinitis: Secondary | ICD-10-CM

## 2018-04-08 DIAGNOSIS — D229 Melanocytic nevi, unspecified: Secondary | ICD-10-CM | POA: Diagnosis not present

## 2018-04-08 DIAGNOSIS — D485 Neoplasm of uncertain behavior of skin: Secondary | ICD-10-CM | POA: Diagnosis not present

## 2018-04-08 DIAGNOSIS — L720 Epidermal cyst: Secondary | ICD-10-CM | POA: Diagnosis not present

## 2018-04-08 DIAGNOSIS — D1800 Hemangioma unspecified site: Secondary | ICD-10-CM | POA: Diagnosis not present

## 2018-04-08 DIAGNOSIS — L578 Other skin changes due to chronic exposure to nonionizing radiation: Secondary | ICD-10-CM | POA: Diagnosis not present

## 2018-04-08 DIAGNOSIS — D225 Melanocytic nevi of trunk: Secondary | ICD-10-CM | POA: Diagnosis not present

## 2018-04-08 DIAGNOSIS — R21 Rash and other nonspecific skin eruption: Secondary | ICD-10-CM | POA: Diagnosis not present

## 2018-04-08 DIAGNOSIS — Z1283 Encounter for screening for malignant neoplasm of skin: Secondary | ICD-10-CM | POA: Diagnosis not present

## 2018-04-23 DIAGNOSIS — K219 Gastro-esophageal reflux disease without esophagitis: Secondary | ICD-10-CM | POA: Diagnosis not present

## 2018-04-23 DIAGNOSIS — H6120 Impacted cerumen, unspecified ear: Secondary | ICD-10-CM | POA: Diagnosis not present

## 2018-05-04 DIAGNOSIS — M1990 Unspecified osteoarthritis, unspecified site: Secondary | ICD-10-CM | POA: Diagnosis not present

## 2018-05-04 DIAGNOSIS — G809 Cerebral palsy, unspecified: Secondary | ICD-10-CM | POA: Diagnosis not present

## 2018-05-04 DIAGNOSIS — Z6828 Body mass index (BMI) 28.0-28.9, adult: Secondary | ICD-10-CM | POA: Diagnosis not present

## 2018-05-25 DIAGNOSIS — G8250 Quadriplegia, unspecified: Secondary | ICD-10-CM | POA: Diagnosis not present

## 2018-05-25 DIAGNOSIS — G809 Cerebral palsy, unspecified: Secondary | ICD-10-CM | POA: Diagnosis not present

## 2018-05-25 DIAGNOSIS — M1990 Unspecified osteoarthritis, unspecified site: Secondary | ICD-10-CM | POA: Diagnosis not present

## 2018-05-26 DIAGNOSIS — K219 Gastro-esophageal reflux disease without esophagitis: Secondary | ICD-10-CM | POA: Diagnosis not present

## 2018-05-26 DIAGNOSIS — Z86010 Personal history of colonic polyps: Secondary | ICD-10-CM | POA: Diagnosis not present

## 2018-05-26 DIAGNOSIS — K5909 Other constipation: Secondary | ICD-10-CM | POA: Diagnosis not present

## 2018-05-27 DIAGNOSIS — G809 Cerebral palsy, unspecified: Secondary | ICD-10-CM | POA: Diagnosis not present

## 2018-05-27 DIAGNOSIS — M1990 Unspecified osteoarthritis, unspecified site: Secondary | ICD-10-CM | POA: Diagnosis not present

## 2018-05-27 DIAGNOSIS — G8250 Quadriplegia, unspecified: Secondary | ICD-10-CM | POA: Diagnosis not present

## 2018-05-29 DIAGNOSIS — M1990 Unspecified osteoarthritis, unspecified site: Secondary | ICD-10-CM | POA: Diagnosis not present

## 2018-05-29 DIAGNOSIS — G809 Cerebral palsy, unspecified: Secondary | ICD-10-CM | POA: Diagnosis not present

## 2018-05-29 DIAGNOSIS — G8250 Quadriplegia, unspecified: Secondary | ICD-10-CM | POA: Diagnosis not present

## 2018-06-01 DIAGNOSIS — G809 Cerebral palsy, unspecified: Secondary | ICD-10-CM | POA: Diagnosis not present

## 2018-06-01 DIAGNOSIS — G8250 Quadriplegia, unspecified: Secondary | ICD-10-CM | POA: Diagnosis not present

## 2018-06-01 DIAGNOSIS — M1990 Unspecified osteoarthritis, unspecified site: Secondary | ICD-10-CM | POA: Diagnosis not present

## 2018-06-03 DIAGNOSIS — M1990 Unspecified osteoarthritis, unspecified site: Secondary | ICD-10-CM | POA: Diagnosis not present

## 2018-06-03 DIAGNOSIS — G8250 Quadriplegia, unspecified: Secondary | ICD-10-CM | POA: Diagnosis not present

## 2018-06-03 DIAGNOSIS — G809 Cerebral palsy, unspecified: Secondary | ICD-10-CM | POA: Diagnosis not present

## 2018-06-05 DIAGNOSIS — G8250 Quadriplegia, unspecified: Secondary | ICD-10-CM | POA: Diagnosis not present

## 2018-06-05 DIAGNOSIS — G809 Cerebral palsy, unspecified: Secondary | ICD-10-CM | POA: Diagnosis not present

## 2018-06-05 DIAGNOSIS — M1990 Unspecified osteoarthritis, unspecified site: Secondary | ICD-10-CM | POA: Diagnosis not present

## 2018-06-08 DIAGNOSIS — G8250 Quadriplegia, unspecified: Secondary | ICD-10-CM | POA: Diagnosis not present

## 2018-06-08 DIAGNOSIS — M1990 Unspecified osteoarthritis, unspecified site: Secondary | ICD-10-CM | POA: Diagnosis not present

## 2018-06-08 DIAGNOSIS — G809 Cerebral palsy, unspecified: Secondary | ICD-10-CM | POA: Diagnosis not present

## 2018-06-10 DIAGNOSIS — G809 Cerebral palsy, unspecified: Secondary | ICD-10-CM | POA: Diagnosis not present

## 2018-06-10 DIAGNOSIS — G8250 Quadriplegia, unspecified: Secondary | ICD-10-CM | POA: Diagnosis not present

## 2018-06-10 DIAGNOSIS — M1990 Unspecified osteoarthritis, unspecified site: Secondary | ICD-10-CM | POA: Diagnosis not present

## 2018-06-12 DIAGNOSIS — G8250 Quadriplegia, unspecified: Secondary | ICD-10-CM | POA: Diagnosis not present

## 2018-06-12 DIAGNOSIS — M1990 Unspecified osteoarthritis, unspecified site: Secondary | ICD-10-CM | POA: Diagnosis not present

## 2018-06-12 DIAGNOSIS — G809 Cerebral palsy, unspecified: Secondary | ICD-10-CM | POA: Diagnosis not present

## 2018-06-15 DIAGNOSIS — G809 Cerebral palsy, unspecified: Secondary | ICD-10-CM | POA: Diagnosis not present

## 2018-06-15 DIAGNOSIS — M1990 Unspecified osteoarthritis, unspecified site: Secondary | ICD-10-CM | POA: Diagnosis not present

## 2018-06-15 DIAGNOSIS — G8250 Quadriplegia, unspecified: Secondary | ICD-10-CM | POA: Diagnosis not present

## 2018-06-18 DIAGNOSIS — G8250 Quadriplegia, unspecified: Secondary | ICD-10-CM | POA: Diagnosis not present

## 2018-06-18 DIAGNOSIS — M1990 Unspecified osteoarthritis, unspecified site: Secondary | ICD-10-CM | POA: Diagnosis not present

## 2018-06-18 DIAGNOSIS — G809 Cerebral palsy, unspecified: Secondary | ICD-10-CM | POA: Diagnosis not present

## 2018-06-19 DIAGNOSIS — G809 Cerebral palsy, unspecified: Secondary | ICD-10-CM | POA: Diagnosis not present

## 2018-06-19 DIAGNOSIS — G8250 Quadriplegia, unspecified: Secondary | ICD-10-CM | POA: Diagnosis not present

## 2018-06-19 DIAGNOSIS — M1990 Unspecified osteoarthritis, unspecified site: Secondary | ICD-10-CM | POA: Diagnosis not present

## 2018-06-22 DIAGNOSIS — G8250 Quadriplegia, unspecified: Secondary | ICD-10-CM | POA: Diagnosis not present

## 2018-06-22 DIAGNOSIS — M1990 Unspecified osteoarthritis, unspecified site: Secondary | ICD-10-CM | POA: Diagnosis not present

## 2018-06-22 DIAGNOSIS — G809 Cerebral palsy, unspecified: Secondary | ICD-10-CM | POA: Diagnosis not present

## 2018-06-24 DIAGNOSIS — G809 Cerebral palsy, unspecified: Secondary | ICD-10-CM | POA: Diagnosis not present

## 2018-06-24 DIAGNOSIS — M1990 Unspecified osteoarthritis, unspecified site: Secondary | ICD-10-CM | POA: Diagnosis not present

## 2018-06-24 DIAGNOSIS — G8250 Quadriplegia, unspecified: Secondary | ICD-10-CM | POA: Diagnosis not present

## 2018-06-26 DIAGNOSIS — G809 Cerebral palsy, unspecified: Secondary | ICD-10-CM | POA: Diagnosis not present

## 2018-06-26 DIAGNOSIS — M1990 Unspecified osteoarthritis, unspecified site: Secondary | ICD-10-CM | POA: Diagnosis not present

## 2018-06-26 DIAGNOSIS — G8250 Quadriplegia, unspecified: Secondary | ICD-10-CM | POA: Diagnosis not present

## 2018-06-29 DIAGNOSIS — G8250 Quadriplegia, unspecified: Secondary | ICD-10-CM | POA: Diagnosis not present

## 2018-06-29 DIAGNOSIS — M1990 Unspecified osteoarthritis, unspecified site: Secondary | ICD-10-CM | POA: Diagnosis not present

## 2018-06-29 DIAGNOSIS — G809 Cerebral palsy, unspecified: Secondary | ICD-10-CM | POA: Diagnosis not present

## 2018-07-01 DIAGNOSIS — G8250 Quadriplegia, unspecified: Secondary | ICD-10-CM | POA: Diagnosis not present

## 2018-07-01 DIAGNOSIS — G809 Cerebral palsy, unspecified: Secondary | ICD-10-CM | POA: Diagnosis not present

## 2018-07-01 DIAGNOSIS — M1990 Unspecified osteoarthritis, unspecified site: Secondary | ICD-10-CM | POA: Diagnosis not present

## 2018-07-06 DIAGNOSIS — G809 Cerebral palsy, unspecified: Secondary | ICD-10-CM | POA: Diagnosis not present

## 2018-07-06 DIAGNOSIS — M1990 Unspecified osteoarthritis, unspecified site: Secondary | ICD-10-CM | POA: Diagnosis not present

## 2018-07-06 DIAGNOSIS — G8250 Quadriplegia, unspecified: Secondary | ICD-10-CM | POA: Diagnosis not present

## 2018-07-09 ENCOUNTER — Other Ambulatory Visit: Payer: Medicare Other

## 2018-07-09 DIAGNOSIS — F99 Mental disorder, not otherwise specified: Secondary | ICD-10-CM | POA: Diagnosis not present

## 2018-07-09 DIAGNOSIS — F5105 Insomnia due to other mental disorder: Secondary | ICD-10-CM | POA: Diagnosis not present

## 2018-07-09 DIAGNOSIS — F419 Anxiety disorder, unspecified: Secondary | ICD-10-CM | POA: Diagnosis not present

## 2018-07-09 DIAGNOSIS — Z79899 Other long term (current) drug therapy: Secondary | ICD-10-CM

## 2018-07-09 DIAGNOSIS — G800 Spastic quadriplegic cerebral palsy: Secondary | ICD-10-CM

## 2018-07-09 DIAGNOSIS — N3010 Interstitial cystitis (chronic) without hematuria: Secondary | ICD-10-CM

## 2018-07-09 DIAGNOSIS — D509 Iron deficiency anemia, unspecified: Secondary | ICD-10-CM

## 2018-07-09 DIAGNOSIS — R799 Abnormal finding of blood chemistry, unspecified: Secondary | ICD-10-CM

## 2018-07-10 LAB — TSH: TSH: 0.79 mIU/L

## 2018-07-10 LAB — HEMOGLOBIN A1C
Hgb A1c MFr Bld: 5.1 % of total Hgb (ref <5.7)
Mean Plasma Glucose: 100 (calc)
eAG (mmol/L): 5.5 (calc)

## 2018-07-10 LAB — COMPLETE METABOLIC PANEL WITH GFR
AG RATIO: 1.6 (calc) (ref 1.0–2.5)
ALT: 13 U/L (ref 6–29)
AST: 25 U/L (ref 10–35)
Albumin: 3.8 g/dL (ref 3.6–5.1)
Alkaline phosphatase (APISO): 89 U/L (ref 33–130)
BILIRUBIN TOTAL: 0.2 mg/dL (ref 0.2–1.2)
BUN / CREAT RATIO: 54 (calc) — AB (ref 6–22)
BUN: 26 mg/dL — ABNORMAL HIGH (ref 7–25)
CALCIUM: 8.8 mg/dL (ref 8.6–10.4)
CO2: 25 mmol/L (ref 20–32)
Chloride: 102 mmol/L (ref 98–110)
Creat: 0.48 mg/dL — ABNORMAL LOW (ref 0.50–1.05)
GFR, EST AFRICAN AMERICAN: 132 mL/min/{1.73_m2} (ref >=60)
GFR, EST NON AFRICAN AMERICAN: 114 mL/min/{1.73_m2} (ref >=60)
GLOBULIN: 2.4 g/dL (ref 1.9–3.7)
Glucose, Bld: 81 mg/dL (ref 65–99)
Potassium: 4.1 mmol/L (ref 3.5–5.3)
SODIUM: 137 mmol/L (ref 135–146)
TOTAL PROTEIN: 6.2 g/dL (ref 6.1–8.1)

## 2018-07-10 LAB — CBC WITH DIFFERENTIAL/PLATELET
BASOS ABS: 8 {cells}/uL (ref 0–200)
Basophils Relative: 0.1 %
EOS ABS: 221 {cells}/uL (ref 15–500)
Eosinophils Relative: 2.8 %
HCT: 32.9 % — ABNORMAL LOW (ref 35.0–45.0)
HEMOGLOBIN: 11 g/dL — AB (ref 11.7–15.5)
Lymphs Abs: 1240 cells/uL (ref 850–3900)
MCH: 29.6 pg (ref 27.0–33.0)
MCHC: 33.4 g/dL (ref 32.0–36.0)
MCV: 88.4 fL (ref 80.0–100.0)
MONOS PCT: 4.8 %
MPV: 11.4 fL (ref 7.5–12.5)
NEUTROS PCT: 76.6 %
Neutro Abs: 6051 cells/uL (ref 1500–7800)
Platelets: 137 10*3/uL — ABNORMAL LOW (ref 140–400)
RBC: 3.72 10*6/uL — ABNORMAL LOW (ref 3.80–5.10)
RDW: 14.3 % (ref 11.0–15.0)
TOTAL LYMPHOCYTE: 15.7 %
WBC mixed population: 379 cells/uL (ref 200–950)
WBC: 7.9 10*3/uL (ref 3.8–10.8)

## 2018-07-10 LAB — LIPID PANEL
CHOLESTEROL: 104 mg/dL (ref <200)
HDL: 58 mg/dL (ref >50)
LDL Cholesterol (Calc): 33 mg/dL (calc)
Non-HDL Cholesterol (Calc): 46 mg/dL (calc) (ref <130)
TRIGLYCERIDES: 54 mg/dL (ref <150)
Total CHOL/HDL Ratio: 1.8 (calc) (ref <5.0)

## 2018-07-14 ENCOUNTER — Encounter: Payer: Medicare Other | Admitting: Family Medicine

## 2018-07-14 DIAGNOSIS — J019 Acute sinusitis, unspecified: Secondary | ICD-10-CM | POA: Diagnosis not present

## 2018-07-15 ENCOUNTER — Encounter: Payer: Medicare Other | Admitting: Family Medicine

## 2018-07-15 ENCOUNTER — Ambulatory Visit (INDEPENDENT_AMBULATORY_CARE_PROVIDER_SITE_OTHER): Payer: Medicare Other | Admitting: Family Medicine

## 2018-07-15 ENCOUNTER — Encounter: Payer: Self-pay | Admitting: Family Medicine

## 2018-07-15 VITALS — BP 106/57 | HR 98 | Temp 98.6°F | Resp 16

## 2018-07-15 DIAGNOSIS — M150 Primary generalized (osteo)arthritis: Secondary | ICD-10-CM | POA: Diagnosis not present

## 2018-07-15 DIAGNOSIS — N3010 Interstitial cystitis (chronic) without hematuria: Secondary | ICD-10-CM | POA: Diagnosis not present

## 2018-07-15 DIAGNOSIS — F79 Unspecified intellectual disabilities: Secondary | ICD-10-CM

## 2018-07-15 DIAGNOSIS — G800 Spastic quadriplegic cerebral palsy: Secondary | ICD-10-CM

## 2018-07-15 DIAGNOSIS — M159 Polyosteoarthritis, unspecified: Secondary | ICD-10-CM

## 2018-07-15 MED ORDER — ELMIRON 100 MG PO CAPS: ORAL_CAPSULE | ORAL | 5 refills | Status: DC

## 2018-07-15 NOTE — Progress Notes (Signed)
Subjective:    Patient ID: Loretta Savage, female    DOB: 1966/04/12, 52 y.o.   MRN: [id number]  Loretta Savage is a 53 y.o. female presenting on 07/15/2018 for Annual Exam  Patient is non verbal. She is unable to provide history. Majority of history is provided by her mother, and legal guardian, [name].  HPI   Lifestyle / Screening - Recent labs show A1c 5.1, and normal fasting sugar. As well as normal cholesterol lipid results. 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 Limited speech and following commands. See prior note and chart for further medical background. - Today no new concerns. 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 She has been attending day program since age 3-4, currently goes to [employer] weekdays in [place], day program with variety of opportunities for her  INSOMNIA / Anxiety - reports chronic issues that seem secondary to her developmental disorder. - Currently remains stable on rare PRN alprazolam 0.5mg  BID and Trazodone 50 mg nightly  Constipation Chronic problem, takes stool softener OTC Docusate in AM and nightly with Laxative Bisacodyl  PMH Seasonal Environmental Allergies  Osteoarthritis multiple joints Reports that this may be due to her developmental delay and short stature, has had arthritis changes in L shoulder and other joints. Has been stable on NSAID, meloxicam - Prior followed by [facility] - History of Scoliosis (C curve), see surgery  for other muscle surgeries  Chronic Interstitial Bladder Cystitis Previously followed by PCP and [facility]. She continues Elmiron with good results, needs bottle instructions changed for proper admin at daycare  Other specialists - Dr Tami Ribas - ENT - Dr Nehemiah Massed [facility] (Dermatology) for general mole check  Health Maintenance:  Due for Flu Shot, ill today with recovering from URI bronchitis, will return 2 weeks after  UTD Colonoscopy  Mammogram would have to go to [place], to lay down to do it, last one 10-15 years ago. They are not ready to pursue repeat mammogram.  Not indicated for pneumonia vaccine at this time.  Depression screen PHQ 2/9 02/04/2018  Decreased Interest 0  Down, Depressed, Hopeless 0  PHQ - 2 Score 0    Past Medical History:  Diagnosis Date  . Anxiety   . Chronic kidney disease    stone  . Colon polyp   . GERD (gastroesophageal reflux disease)   . Urinary incontinence    Past Surgical History:  Procedure Laterality Date  . ABDOMINAL HYSTERECTOMY  2006  . BACK SURGERY  03/1991  . HIP ADDUCTOR TENOTOMY  2004  . REPAIR QUADRICEPS / HAMSTRING MUSCLE  1992   Social History   Socioeconomic History  . Marital status: Single    Spouse name: Not on file  . Number of children: Not on file  . Years of education: Vocational Program  . Highest education level: Associate degree: occupational, Hotel manager, or vocational program  Occupational History  . Not on file  Social Needs  . Financial resource strain: Not on file  .  Food insecurity:    Worry: Not on file    Inability: Not on file  . Transportation needs:    Medical: Not on file    Non-medical: Not on file  Tobacco Use  . Smoking status: Never Smoker  . Smokeless tobacco: Never Used  Substance and Sexual Activity  . Alcohol use: Never    Frequency: Never  . Drug use: Never  . Sexual activity: Never  Lifestyle  . Physical activity:    Days per week: Not on  file    Minutes per session: Not on file  . Stress: Not on file  Relationships  . Social connections:    Talks on phone: Not on file    Gets together: Not on file    Attends religious service: Not on file    Active member of club or organization: Not on file    Attends meetings of clubs or organizations: Not on file    Relationship status: Not on file  . Intimate partner violence:    Fear of current or ex partner: Not on file    Emotionally abused: Not on file    Physically abused: Not on file    Forced sexual activity: Not on file  Other Topics Concern  . Not on file  Social History Narrative  . Not on file   Family History  Problem Relation Age of Onset  . Colon polyps Mother   . Heart disease Father   . Heart attack Father   . Thyroid disease Maternal Grandmother   . Heart disease Maternal Grandfather   . Heart disease Paternal Grandmother   . Heart disease Paternal Grandfather    Current Outpatient Medications on File Prior to Visit  Medication Sig  . acetaminophen (TYLENOL) 500 MG tablet Take by mouth.  . ALPRAZolam (XANAX) 0.5 MG tablet Take 1 tablet (0.5 mg total) by mouth 2 (two) times daily. For anxiety  . aspirin 81 MG EC tablet Take by mouth.  . bisacodyl (MAGIC BULLETS) 10 MG suppository Place rectally.  . Calcium-Vitamin D 600-200 MG-UNIT tablet Take by mouth.  . Dexlansoprazole (DEXILANT) 30 MG capsule 30 mg daily.  Marland Kitchen docusate sodium (COLACE CLEAR) 50 MG capsule Take by mouth.  . doxazosin (CARDURA) 2 MG tablet Take 1 tablet (2 mg total) by mouth at bedtime. For bladder.  . fluticasone (FLONASE) 50 MCG/ACT nasal spray USE 1 SPRAY INTO EACH NOSTRIL TWICE A DAY  . levocetirizine (XYZAL) 5 MG tablet Take 5 mg by mouth daily.  Marland Kitchen loratadine (CLARITIN) 10 MG tablet Take by mouth.  . meloxicam (MOBIC) 15 MG tablet TAKE 1 TABLET BY MOUTH DAILY FOR ARTHRITIS  . montelukast (SINGULAIR) 10 MG tablet Take 1 tablet (10 mg total) by mouth at bedtime. For allergies  .  nystatin (MYCOSTATIN) 100000 UNIT/ML suspension   . sucralfate (CARAFATE) 1 g tablet TAKE 1 TABLET BY MOUTH AT 10:30AM AND 1 TABLET AT BEDTIME EVERY DAY  . traZODone (DESYREL) 50 MG tablet Take 1 tablet (50 mg total) by mouth at bedtime. For sleep   No current facility-administered medications on file prior to visit.     Review of Systems Per HPI unless specifically indicated above  Unable to obtain full ROS due to limited input from patient. - See above HPI also, denies diarrhea, complaints of pain, swelling, behavior change      Objective:    BP (!) 106/57   Pulse 98   Temp 98.6 F (37 C) (Oral)   Resp 16  Wt Readings from Last 3 Encounters:  02/04/18 114 lb (51.7 kg)    Physical Exam  Constitutional: She appears well-nourished. No distress.  Small stature with some dysmorphic features, comfortable, seated in wheelchair  HENT:  Head: Atraumatic.  Moist mucus mem, unable to get complete view of oropharynx due to limited participation  Eyes: Conjunctivae are normal. Right eye exhibits no discharge. Left eye exhibits no discharge.  Neck: No thyromegaly present.  Cardiovascular: Normal rate, regular rhythm, normal heart sounds and intact distal pulses.  No murmur heard. Pulmonary/Chest: Effort normal. No respiratory distress. She has no wheezes. She has no rales.  Exam is limited by participation, not following commands for deep breaths.  Some mild lower respiratory rhonchi that improves. Occasional cough in office. No wheezing or focal crackles.  Abdominal: Bowel sounds are normal. She exhibits no distension. There is no tenderness.  Musculoskeletal: She exhibits deformity (bilateral lower extremity in rigid braces). She exhibits no edema.  Lymphadenopathy:    She has no cervical adenopathy.  Neurological: She is alert. She exhibits abnormal muscle tone (chronic CP).  Does not follow most commands  Skin: Skin is warm and dry. No rash noted. She is not diaphoretic. No  erythema.  Psychiatric:  Well groomed, poor eye contact, non verbal, does not appear agitated  Nursing note and vitals reviewed.  Results for orders placed or performed in visit on 07/09/18  TSH  Result Value Ref Range   TSH 0.79 mIU/L  Lipid panel  Result Value Ref Range   Cholesterol 104 <200 mg/dL   HDL 58 >50 mg/dL   Triglycerides 54 <150 mg/dL   LDL Cholesterol (Calc) 33 mg/dL (calc)   Total CHOL/HDL Ratio 1.8 <5.0 (calc)   Non-HDL Cholesterol (Calc) 46 <130 mg/dL (calc)  COMPLETE METABOLIC PANEL WITH GFR  Result Value Ref Range   Glucose, Bld 81 65 - 99 mg/dL   BUN 26 (H) 7 - 25 mg/dL   Creat 0.48 (L) 0.50 - 1.05 mg/dL   GFR, Est Non African American 114 > OR = 60 mL/min/1.9m2   GFR, Est African American 132 > OR = 60 mL/min/1.71m2   BUN/Creatinine Ratio 54 (H) 6 - 22 (calc)   Sodium 137 135 - 146 mmol/L   Potassium 4.1 3.5 - 5.3 mmol/L   Chloride 102 98 - 110 mmol/L   CO2 25 20 - 32 mmol/L   Calcium 8.8 8.6 - 10.4 mg/dL   Total Protein 6.2 6.1 - 8.1 g/dL   Albumin 3.8 3.6 - 5.1 g/dL   Globulin 2.4 1.9 - 3.7 g/dL (calc)   AG Ratio 1.6 1.0 - 2.5 (calc)   Total Bilirubin 0.2 0.2 - 1.2 mg/dL   Alkaline phosphatase (APISO) 89 33 - 130 U/L   AST 25 10 - 35 U/L   ALT 13 6 - 29 U/L  CBC with Differential/Platelet  Result Value Ref Range   WBC 7.9 3.8 - 10.8 Thousand/uL   RBC 3.72 (L) 3.80 - 5.10 Million/uL   Hemoglobin 11.0 (L) 11.7 - 15.5 g/dL   HCT 32.9 (L) 35.0 - 45.0 %   MCV 88.4 80.0 - 100.0 fL   MCH 29.6 27.0 - 33.0 pg   MCHC 33.4 32.0 - 36.0 g/dL   RDW 14.3 11.0 - 15.0 %   Platelets 137 (L) 140 - 400 Thousand/uL   MPV 11.4 7.5 - 12.5 fL   Neutro Abs 6,051 1,500 - 7,800 cells/uL   Lymphs Abs 1,240 850 - 3,900 cells/uL   WBC  mixed population 379 200 - 950 cells/uL   Eosinophils Absolute 221 15 - 500 cells/uL   Basophils Absolute 8 0 - 200 cells/uL   Neutrophils Relative % 76.6 %   Total Lymphocyte 15.7 %   Monocytes Relative 4.8 %   Eosinophils Relative  2.8 %   Basophils Relative 0.1 %  Hemoglobin A1c  Result Value Ref Range   Hgb A1c MFr Bld 5.1 <5.7 % of total Hgb   Mean Plasma Glucose 100 (calc)   eAG (mmol/L) 5.5 (calc)      Assessment & Plan:   Problem List Items Addressed This Visit    Chronic interstitial cystitis    Currently controlled on Elmiron agent QID Prior work-up from [facility], has had prior dx UTI and nephrolithiasis, ultimately seems to be symptoms secondary to cystitis  Plan - Refilled Elmiron 100mg  corrected dosage times per rx for proper admin, rx DAW brand, cost is high but med is effective, has been approved in past - Also on Doxazosin for improve urinary flow      Relevant Medications   ELMIRON 100 MG capsule   Intellectual disability    Stable chronic problem, 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      Primary osteoarthritis involving multiple joints    Chronic problem, multiple joints including shoulders Secondary to underlying developmental delay / disorder and CP Continue Meloxicam 15mg  daily Followed by [facility]      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 Currently on antibiotic and treatment, sick contact mother. No new changes and this problem was not fully addressed today or treated. Advised to return if not improving.  Meds ordered this encounter  Medications  . ELMIRON 100 MG capsule    Sig: Take 1 capsule by mouth at 10:00am, 1 cap at 2:00pm, 1 cap at 6:00pm, and 1 cap at 10:00pm for bladder    Dispense:  120 capsule    Refill:  5    Change written instructions  on bottle from QID to specific times for her daycare program to administer med at appropriate time based on bottle instructions    Follow up plan: Return in about 6 months (around 01/13/2019) for 6 month follow-up.  Nobie Putnam, [facility] 07/15/2018, 3:23 PM

## 2018-07-15 NOTE — Patient Instructions (Addendum)
Thank you for coming to the office today.  Please schedule and return for a NURSE ONLY VISIT for VACCINE - Approximately around 2 weeks from today after Respiratory Illness - Need Flu Vaccine - can return when feeling better   1. Chemistry - Mostly Normal results, including electrolytes, kidney and liver function. Normal fasting blood sugar   2. Hemoglobin A1c (Diabetes screening) - 5.1, normal not in range of Pre-Diabetes (>5.7 to 6.4)   3. TSH Thyroid Function Tests - Normal.  4. Cholesterol - Normal cholesterol results.   5. CBC Blood Counts - Mild anemia with slightly low Hemglobin 11. Borderline low platelets prior numbers had been better   Please schedule a Follow-up Appointment to: Return in about 6 months (around 01/13/2019) for 6 month follow-up.  If you have any other questions or concerns, please feel free to call the office or send a message through [facility]. You may also schedule an earlier appointment if necessary.  Additionally, you may be receiving a survey about your experience at our office within a few days to 1 week by e-mail or mail. We value your feedback.  Nobie Putnam, DO [place] [facility]

## 2018-07-16 NOTE — Assessment & Plan Note (Signed)
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

## 2018-07-16 NOTE — Assessment & Plan Note (Signed)
Chronic problem, in setting of congenital cerebral palsy Followed by [facility] for botox injections q 4 months Non ambulatory, Has brace supports for legs, s/p prior surgeries for muscle tenotomy Cognitive and intellectual developmental delay as well in setting of CP Continues multi symptom management on meds 

## 2018-07-16 NOTE — Assessment & Plan Note (Addendum)
Chronic problem, multiple joints including shoulders Secondary to underlying developmental delay / disorder and CP Continue Meloxicam 15mg  daily Followed by [facility]

## 2018-07-16 NOTE — Assessment & Plan Note (Signed)
Currently controlled on Elmiron agent QID Prior work-up from [facility], has had prior dx UTI and nephrolithiasis, ultimately seems to be symptoms secondary to cystitis  Plan - Refilled Elmiron 100mg  corrected dosage times per rx for proper admin, rx DAW brand, cost is high but med is effective, has been approved in past - Also on Doxazosin for improve urinary flow

## 2018-07-20 DIAGNOSIS — M1990 Unspecified osteoarthritis, unspecified site: Secondary | ICD-10-CM | POA: Diagnosis not present

## 2018-07-20 DIAGNOSIS — G809 Cerebral palsy, unspecified: Secondary | ICD-10-CM | POA: Diagnosis not present

## 2018-07-20 DIAGNOSIS — G8250 Quadriplegia, unspecified: Secondary | ICD-10-CM | POA: Diagnosis not present

## 2018-07-23 DIAGNOSIS — G8250 Quadriplegia, unspecified: Secondary | ICD-10-CM | POA: Diagnosis not present

## 2018-07-23 DIAGNOSIS — G809 Cerebral palsy, unspecified: Secondary | ICD-10-CM | POA: Diagnosis not present

## 2018-07-23 DIAGNOSIS — M1990 Unspecified osteoarthritis, unspecified site: Secondary | ICD-10-CM | POA: Diagnosis not present

## 2018-07-24 DIAGNOSIS — M1990 Unspecified osteoarthritis, unspecified site: Secondary | ICD-10-CM | POA: Diagnosis not present

## 2018-07-24 DIAGNOSIS — G8250 Quadriplegia, unspecified: Secondary | ICD-10-CM | POA: Diagnosis not present

## 2018-07-24 DIAGNOSIS — G809 Cerebral palsy, unspecified: Secondary | ICD-10-CM | POA: Diagnosis not present

## 2018-07-27 DIAGNOSIS — G809 Cerebral palsy, unspecified: Secondary | ICD-10-CM | POA: Diagnosis not present

## 2018-07-27 DIAGNOSIS — G8250 Quadriplegia, unspecified: Secondary | ICD-10-CM | POA: Diagnosis not present

## 2018-07-27 DIAGNOSIS — M1990 Unspecified osteoarthritis, unspecified site: Secondary | ICD-10-CM | POA: Diagnosis not present

## 2018-07-31 ENCOUNTER — Other Ambulatory Visit: Payer: Self-pay | Admitting: Family Medicine

## 2018-07-31 DIAGNOSIS — G809 Cerebral palsy, unspecified: Secondary | ICD-10-CM | POA: Diagnosis not present

## 2018-07-31 DIAGNOSIS — J302 Other seasonal allergic rhinitis: Secondary | ICD-10-CM

## 2018-07-31 DIAGNOSIS — M1990 Unspecified osteoarthritis, unspecified site: Secondary | ICD-10-CM | POA: Diagnosis not present

## 2018-07-31 DIAGNOSIS — G8250 Quadriplegia, unspecified: Secondary | ICD-10-CM | POA: Diagnosis not present

## 2018-08-02 ENCOUNTER — Other Ambulatory Visit: Payer: Self-pay | Admitting: Family Medicine

## 2018-08-02 DIAGNOSIS — F5105 Insomnia due to other mental disorder: Secondary | ICD-10-CM

## 2018-08-02 DIAGNOSIS — F99 Mental disorder, not otherwise specified: Principal | ICD-10-CM

## 2018-08-04 DIAGNOSIS — G8250 Quadriplegia, unspecified: Secondary | ICD-10-CM | POA: Diagnosis not present

## 2018-08-04 DIAGNOSIS — M1990 Unspecified osteoarthritis, unspecified site: Secondary | ICD-10-CM | POA: Diagnosis not present

## 2018-08-04 DIAGNOSIS — G809 Cerebral palsy, unspecified: Secondary | ICD-10-CM | POA: Diagnosis not present

## 2018-08-07 DIAGNOSIS — G8250 Quadriplegia, unspecified: Secondary | ICD-10-CM | POA: Diagnosis not present

## 2018-08-07 DIAGNOSIS — M1990 Unspecified osteoarthritis, unspecified site: Secondary | ICD-10-CM | POA: Diagnosis not present

## 2018-08-07 DIAGNOSIS — G809 Cerebral palsy, unspecified: Secondary | ICD-10-CM | POA: Diagnosis not present

## 2018-08-19 ENCOUNTER — Telehealth: Payer: Self-pay | Admitting: Family Medicine

## 2018-08-19 NOTE — Telephone Encounter (Signed)
Left message for patient to call back  

## 2018-08-19 NOTE — Telephone Encounter (Signed)
DSS .... Louie Casa called [phone] requesting you to call her

## 2018-08-20 NOTE — Telephone Encounter (Signed)
Called them again, I left another voicemail for [place]. Asking them to contact us back to share more information on how I can help.  Nobie Putnam, DO [facility] 08/20/2018, 1:42 PM
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
## 2018-08-21 NOTE — Telephone Encounter (Signed)
Try calling patient phone # provided unable to reach no VM set up.

## 2018-08-21 NOTE — Telephone Encounter (Signed)
Left detail message. 

## 2018-08-24 NOTE — Telephone Encounter (Signed)
Try calling patient unable to reach informed Thayer Headings to keep trying to schedule an appointment.

## 2018-09-06 ENCOUNTER — Other Ambulatory Visit: Payer: Self-pay | Admitting: Family Medicine

## 2018-09-06 DIAGNOSIS — F419 Anxiety disorder, unspecified: Secondary | ICD-10-CM

## 2018-09-06 DIAGNOSIS — F79 Unspecified intellectual disabilities: Secondary | ICD-10-CM

## 2018-09-07 DIAGNOSIS — G808 Other cerebral palsy: Secondary | ICD-10-CM | POA: Diagnosis not present

## 2018-11-01 ENCOUNTER — Other Ambulatory Visit: Payer: Self-pay | Admitting: Family Medicine

## 2018-11-01 DIAGNOSIS — F99 Mental disorder, not otherwise specified: Principal | ICD-10-CM

## 2018-11-01 DIAGNOSIS — F5105 Insomnia due to other mental disorder: Secondary | ICD-10-CM

## 2018-11-03 ENCOUNTER — Telehealth: Payer: Self-pay | Admitting: Family Medicine

## 2018-11-03 DIAGNOSIS — N3010 Interstitial cystitis (chronic) without hematuria: Secondary | ICD-10-CM

## 2018-11-03 MED ORDER — METHENAMINE HIPPURATE 1 G PO TABS: 1.0000 g | ORAL_TABLET | Freq: Two times a day (BID) | ORAL | 1 refills | Status: DC

## 2018-11-03 NOTE — Telephone Encounter (Signed)
Reviewed medication change. Agree with switch to Methenamine Hippurate 1000mg  BID dosing - instead of Elmiron. She will stop Elmiron due to higher tier cost and start new generic rx sent to pharmacy.  Also discontinued Montelukast if preferred.  Nobie Putnam, [facility] 11/03/2018, 5:42 PM

## 2018-11-03 NOTE — Telephone Encounter (Signed)
Pt received letter from [facility] that medication elmiron needs to be changed to methenamine hippurate tablet.  Mother asked it pt could stop montellukast or cut back because pt is very moody and cries.  Her call back number is [phone]

## 2018-11-04 NOTE — Telephone Encounter (Signed)
Left message for patient to call back  

## 2018-11-05 NOTE — Telephone Encounter (Signed)
Left message

## 2018-11-05 NOTE — Telephone Encounter (Signed)
Patient's mom advised 

## 2018-12-03 ENCOUNTER — Telehealth: Payer: Self-pay | Admitting: Family Medicine

## 2018-12-03 NOTE — Telephone Encounter (Signed)
Spoke to [place] she is concerned about med's that Rx methenamine 1g see previous message   "Reviewed medication change. Agree with switch to Methenamine Hippurate 1000 mg BID dosing - instead of Elmiron. She will stop Elmiron due to higher tier cost and start new generic Rx sent to pharmacy. Also discontinued Montelukast if preferred." from last year  explain Loretta Savage about the reason for change and she was under impression that it might be generic but not alternative. She want patient to go back on Elmiron. Not understanding even after explain her multiple time and stating that it is antibiotics with so many side effects. She wants to discuss these with provider, advised her to schedule an appointment.

## 2018-12-03 NOTE — Telephone Encounter (Signed)
We received an after hours question from patient's mother, Datha Kissinger, phone call does not show what she needed. It was in reference to [place] - methenamine 1g BID.  Can you call to find out what she needs?  Thanks  Nobie Putnam, [facility] 12/03/2018, 12:31 PM

## 2018-12-03 NOTE — Telephone Encounter (Signed)
I called patient's mother, caregiver, Loretta Savage. She expresses her concerns about the new medicine methenamine that was prescribed recently due to insurance coverage change and cost of prior Elmiron.  She states Loretta Savage has been on Elmiron for >5 years, and it was only thing that worked, she has seen urology before, has been treated for UTI and kidney stone, etc. She has not been able to come off of this medicine in past.  She understands that there is no cheaper alternative in that class of Elmiron for long term use. I advised her that the methenamine after further review is designed for chronic use, and preventing cystitis / infection. It is classified as antibiotic, but it is appropriate to use.   She will consider her options, and understand may have to try the new medicine temporary 2-4 weeks and if not effective - may need to request repeat rx Elmiron and have to submit a letter of medical necessity to keep cost affordable.  She will notify us next week with her decision.  Loretta Putnam, DO [facility] 12/03/2018, 4:03 PM

## 2018-12-08 ENCOUNTER — Ambulatory Visit: Payer: Medicare Other | Admitting: Family Medicine

## 2018-12-25 ENCOUNTER — Other Ambulatory Visit: Payer: Self-pay | Admitting: Family Medicine

## 2018-12-25 DIAGNOSIS — F79 Unspecified intellectual disabilities: Secondary | ICD-10-CM

## 2018-12-25 DIAGNOSIS — F419 Anxiety disorder, unspecified: Secondary | ICD-10-CM

## 2019-01-21 ENCOUNTER — Other Ambulatory Visit: Payer: Self-pay | Admitting: Family Medicine

## 2019-01-21 DIAGNOSIS — M150 Primary generalized (osteo)arthritis: Principal | ICD-10-CM

## 2019-01-21 DIAGNOSIS — M159 Polyosteoarthritis, unspecified: Secondary | ICD-10-CM

## 2019-02-03 ENCOUNTER — Other Ambulatory Visit: Payer: Self-pay | Admitting: Family Medicine

## 2019-02-03 ENCOUNTER — Other Ambulatory Visit: Payer: Self-pay

## 2019-02-03 ENCOUNTER — Ambulatory Visit (INDEPENDENT_AMBULATORY_CARE_PROVIDER_SITE_OTHER): Payer: Medicare Other | Admitting: Family Medicine

## 2019-02-03 ENCOUNTER — Ambulatory Visit: Payer: Medicare Other | Admitting: Family Medicine

## 2019-02-03 ENCOUNTER — Encounter: Payer: Self-pay | Admitting: Family Medicine

## 2019-02-03 DIAGNOSIS — F79 Unspecified intellectual disabilities: Secondary | ICD-10-CM | POA: Diagnosis not present

## 2019-02-03 DIAGNOSIS — F5105 Insomnia due to other mental disorder: Secondary | ICD-10-CM

## 2019-02-03 DIAGNOSIS — M150 Primary generalized (osteo)arthritis: Secondary | ICD-10-CM

## 2019-02-03 DIAGNOSIS — F99 Mental disorder, not otherwise specified: Secondary | ICD-10-CM | POA: Diagnosis not present

## 2019-02-03 DIAGNOSIS — F419 Anxiety disorder, unspecified: Secondary | ICD-10-CM | POA: Diagnosis not present

## 2019-02-03 DIAGNOSIS — K219 Gastro-esophageal reflux disease without esophagitis: Secondary | ICD-10-CM

## 2019-02-03 DIAGNOSIS — G800 Spastic quadriplegic cerebral palsy: Secondary | ICD-10-CM

## 2019-02-03 DIAGNOSIS — Z79899 Other long term (current) drug therapy: Secondary | ICD-10-CM

## 2019-02-03 DIAGNOSIS — N3010 Interstitial cystitis (chronic) without hematuria: Secondary | ICD-10-CM | POA: Diagnosis not present

## 2019-02-03 DIAGNOSIS — R7309 Other abnormal glucose: Secondary | ICD-10-CM

## 2019-02-03 DIAGNOSIS — M159 Polyosteoarthritis, unspecified: Secondary | ICD-10-CM

## 2019-02-03 MED ORDER — DOXAZOSIN MESYLATE 2 MG PO TABS: 2.0000 mg | ORAL_TABLET | Freq: Every day | ORAL | 11 refills | Status: DC

## 2019-02-03 NOTE — Progress Notes (Signed)
Virtual Visit via Telephone The purpose of this virtual visit is to provide medical care while limiting exposure to the novel coronavirus (COVID19) for both patient and office staff.  Consent was obtained for phone visit:  Yes.   Answered questions that patient had about telehealth interaction:  Yes.   I discussed the limitations, risks, security and privacy concerns of performing an evaluation and management service by telephone. I also discussed with the patient that there may be a patient responsible charge related to this service. The patient expressed understanding and agreed to proceed.  Patient Location: Home  Patient is non verbal. She is unable to provide history. Majority of history is provided by her mother, and legal guardian, Oddie Kuhlmann.  Provider Location: [facility])  ---------------------------------------------------------------------- Chief Complaint  Patient presents with  . Anxiety  . Chronic Cystitis  . Cerebral Palsy    S: Reviewed CMA note. I have called patient and gathered additional HPI as follows from patient's legal guardian/mother - Sunday Spillers.  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 Limited speech and following commands. See prior note and chart for further medical background. - Today no new concerns. 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 She has been attending day program since age 65-4 - Previously going to [facility] weekdays in [place] - now it was suspended due to [place] precautions, she has been at home, more anxious lately due to not being able to be as  active outdoors with program  INSOMNIA / Anxiety - reports chronic issues that seem secondary to her developmental disorder. - See above - more anxious less activity now due to stay at home restrictions w/ COVID19 precaution - Currently remains stable on rare PRN alprazolam 0.5mg  BID and Trazodone 50 mg nightly  Chronic InterstitialBladder Cystitis Last update in February 2020 - discussed her chronic medication for cystitis, she has been taking it since that time, agreed to continue on this low cost covered option from insurance due to elevated price of Elmiron previous med now on tier 5 - Taking Methenamine 1g BID, seems to be working fairly well - does not seem to be a problem or cause any reported episodes of crying spells due to cystitis  Needs refill for Doxazosin, Meloxicam  Patient is currently at home in isolation Denies any high risk travel to areas of current concern for [place]. Denies any known or suspected exposure to person with or possibly with COVID19.  Denies any fevers, chills, sweats, body ache, cough, shortness of breath, sinus pain or pressure, headache, abdominal pain, diarrhea  Past Medical History:  Diagnosis Date  . Anxiety   . Chronic kidney disease    stone  . Colon polyp   . GERD (gastroesophageal reflux disease)   . Urinary incontinence    Social History   Tobacco Use  . Smoking status: Never Smoker  . Smokeless tobacco: Never Used  Substance Use Topics  . Alcohol use: Never    Frequency: Never  . Drug use: Never    Current Outpatient Medications:  .  acetaminophen (TYLENOL) 500 MG tablet, Take by mouth., Disp: , Rfl:  .  ALPRAZolam (XANAX) 0.5 MG tablet, TAKE 1  TABLET (0.5 MG TOTAL) BY MOUTH 2 (TWO) TIMES DAILY. FOR ANXIETY, Disp: 60 tablet, Rfl: 2 .  aspirin 81 MG EC tablet, Take by mouth., Disp: , Rfl:  .  bisacodyl (MAGIC BULLETS) 10 MG suppository, Place rectally., Disp: , Rfl:  .  Calcium-Vitamin D 600-200 MG-UNIT tablet, Take by  mouth., Disp: , Rfl:  .  Dexlansoprazole (DEXILANT) 30 MG capsule, 30 mg daily., Disp: , Rfl:  .  doxazosin (CARDURA) 2 MG tablet, Take 1 tablet (2 mg total) by mouth at bedtime. For bladder., Disp: 30 tablet, Rfl: 11 .  fluticasone (FLONASE) 50 MCG/ACT nasal spray, USE 1 SPRAY INTO EACH NOSTRIL TWICE A DAY, Disp: 16 g, Rfl: 5 .  levocetirizine (XYZAL) 5 MG tablet, Take 5 mg by mouth daily., Disp: , Rfl: 10 .  loratadine (CLARITIN) 10 MG tablet, Take by mouth., Disp: , Rfl:  .  meloxicam (MOBIC) 15 MG tablet, TAKE 1 TABLET BY MOUTH DAILY FOR ARTHRITIS, Disp: 30 tablet, Rfl: 11 .  methenamine (HIPREX) 1 g tablet, Take 1 tablet (1 g total) by mouth 2 (two) times daily with a meal., Disp: 180 tablet, Rfl: 1 .  sucralfate (CARAFATE) 1 g tablet, TAKE 1 TABLET BY MOUTH AT 10:30AM AND 1 TABLET AT BEDTIME EVERY DAY, Disp: , Rfl: 1 .  traZODone (DESYREL) 50 MG tablet, TAKE 1 TABLET (50 MG TOTAL) BY MOUTH AT BEDTIME. FOR SLEEP, Disp: 90 tablet, Rfl: 1 .  docusate sodium (COLACE CLEAR) 50 MG capsule, Take by mouth., Disp: , Rfl:  .  lidocaine-prilocaine (EMLA) cream, , Disp: , Rfl:  .  nystatin (MYCOSTATIN) 100000 UNIT/ML suspension, , Disp: , Rfl:   Depression screen [facility] 2/9 02/03/2019 02/04/2018  Decreased Interest 3 0  Down, Depressed, Hopeless 2 0  PHQ - 2 Score 5 0  Altered sleeping 0 -  Tired, decreased energy 0 -  Change in appetite 0 -  Feeling bad or failure about yourself  0 -  Trouble concentrating 0 -  Moving slowly or fidgety/restless 0 -  PHQ-9 Score 5 -    GAD 7 : Generalized Anxiety Score 02/03/2019  Nervous, Anxious, on Edge 0  Control/stop worrying 0  Worry too much - different things 1  Trouble relaxing 0  Restless 0  Easily annoyed or irritable 0  Afraid - awful might happen 0  Total GAD 7 Score 1  Anxiety Difficulty Not difficult at all    -------------------------------------------------------------------------- O: No physical exam performed due to remote telephone  encounter.   No results found for this or any previous visit (from the past 2160 hour(s)).  -------------------------------------------------------------------------- A&P:  Problem List Items Addressed This Visit    Anxiety    Recently mildly worse due to limited participation in day program outdoors activity due to [place] precautions Mostly controlled on BDZ daily Secondary to developmental / intellectual delay with CP Refilled Alprazolam today 0.5mg  BID, with refills Follow-up      Chronic interstitial cystitis    Currently controlled on Methenamine 1g BID now, previously changed from Elmiron in 2019 Prior work-up from [facility], has had prior dx UTI and nephrolithiasis, ultimately seems to be symptoms secondary to cystitis  Plan Continue current course with Methenamine 1g BID - caregiver agrees that this seems to be working, will continue as it is covered/ lower cost option for chronic problem - Also on Doxazosin for improve urinary flow - called pharmacy to refill and confirm prescriber name - patient's caregiver was confused by name on her  bottle - did not have my full name it was cut short due to label      Relevant Medications   doxazosin (CARDURA) 2 MG tablet   Insomnia    Relatively stable, controlled on BDZ daily Secondary to developmental / intellectual delay with CP Continue Trazodone today 50mg  at night, with refills Follow-up      Intellectual disability   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 doxazosin (CARDURA) 2 MG tablet    Sig: Take 1 tablet (2 mg total) by mouth at  bedtime. For bladder.    Dispense:  30 tablet    Refill:  11     Follow-up: - Return in 5-6 months for Yearly Medicare Check-up - Future labs ordered for 06/2019 approx - anticipate SAME DAY AS APPOINTMENT   Patient verbalizes understanding with the above medical recommendations including the limitation of remote medical advice.  Specific follow-up and call-back criteria were given for patient to follow-up or seek medical care more urgently if needed.   - Time spent in direct consultation with patient on phone: 13 minutes  Nobie Putnam, [facility] 02/03/2019, 4:08 PM

## 2019-02-03 NOTE — Assessment & Plan Note (Signed)
Currently controlled on Methenamine 1g BID now, previously changed from Elmiron in 2019 Prior work-up from [facility], has had prior dx UTI and nephrolithiasis, ultimately seems to be symptoms secondary to cystitis  Plan Continue current course with Methenamine 1g BID - caregiver agrees that this seems to be working, will continue as it is covered/ lower cost option for chronic problem - Also on Doxazosin for improve urinary flow - called pharmacy to refill and confirm prescriber name - patient's caregiver was confused by name on her bottle - did not have my full name it was cut short due to label

## 2019-02-03 NOTE — Assessment & Plan Note (Signed)
Recently mildly worse due to limited participation in day program outdoors activity due to [place] precautions Mostly controlled on BDZ daily Secondary to developmental / intellectual delay with CP Refilled Alprazolam today 0.5mg  BID, with refills Follow-up

## 2019-02-03 NOTE — Patient Instructions (Signed)
AVS information given verbally - no mychart  DUE for FASTING BLOOD WORK (no food or drink after midnight before the lab appointment, only water or coffee without cream/sugar on the morning of)  SCHEDULE "Lab Only" visit in the morning at the clinic for lab draw in 5-6 MONTHS   - Make sure Lab Only appointment is at about 1 week before your next appointment, so that results will be available  For Lab Results, once available within 2-3 days of blood draw, you can can log in to MyChart online to view your results and a brief explanation. Also, we can discuss results at next follow-up visit.   Please schedule a Follow-up Appointment to: No follow-ups on file.  If you have any other questions or concerns, please feel free to call the office or send a message through [place]. You may also schedule an earlier appointment if necessary.  Additionally, you may be receiving a survey about your experience at our office within a few days to 1 week by e-mail or mail. We value your feedback.  Nobie Putnam, DO [place]

## 2019-02-03 NOTE — Assessment & Plan Note (Signed)
Chronic problem, in setting of congenital cerebral palsy Followed by [facility] for botox injections q 4 months Non ambulatory, Has brace supports for legs, s/p prior surgeries for muscle tenotomy Cognitive and intellectual developmental delay as well in setting of CP Continues multi symptom management on meds

## 2019-02-03 NOTE — Assessment & Plan Note (Signed)
Relatively stable, controlled on BDZ daily Secondary to developmental / intellectual delay with CP Continue Trazodone today 50mg  at night, with refills Follow-up

## 2019-02-14 ENCOUNTER — Other Ambulatory Visit: Payer: Self-pay | Admitting: Family Medicine

## 2019-02-14 DIAGNOSIS — J302 Other seasonal allergic rhinitis: Secondary | ICD-10-CM

## 2019-03-22 ENCOUNTER — Other Ambulatory Visit: Payer: Self-pay | Admitting: Family Medicine

## 2019-03-22 DIAGNOSIS — N3010 Interstitial cystitis (chronic) without hematuria: Secondary | ICD-10-CM

## 2019-04-07 ENCOUNTER — Other Ambulatory Visit: Payer: Self-pay | Admitting: Family Medicine

## 2019-04-07 DIAGNOSIS — F419 Anxiety disorder, unspecified: Secondary | ICD-10-CM

## 2019-04-07 DIAGNOSIS — F79 Unspecified intellectual disabilities: Secondary | ICD-10-CM

## 2019-04-30 ENCOUNTER — Other Ambulatory Visit: Payer: Self-pay | Admitting: Family Medicine

## 2019-04-30 DIAGNOSIS — F99 Mental disorder, not otherwise specified: Secondary | ICD-10-CM

## 2019-04-30 DIAGNOSIS — F5105 Insomnia due to other mental disorder: Secondary | ICD-10-CM

## 2019-07-28 ENCOUNTER — Other Ambulatory Visit: Payer: Self-pay

## 2019-07-28 DIAGNOSIS — R7309 Other abnormal glucose: Secondary | ICD-10-CM

## 2019-07-28 DIAGNOSIS — N3010 Interstitial cystitis (chronic) without hematuria: Secondary | ICD-10-CM

## 2019-07-28 DIAGNOSIS — G800 Spastic quadriplegic cerebral palsy: Secondary | ICD-10-CM

## 2019-07-28 DIAGNOSIS — Z79899 Other long term (current) drug therapy: Secondary | ICD-10-CM

## 2019-07-29 ENCOUNTER — Other Ambulatory Visit: Payer: Medicare Other

## 2019-07-29 ENCOUNTER — Other Ambulatory Visit: Payer: Self-pay

## 2019-07-29 DIAGNOSIS — N3010 Interstitial cystitis (chronic) without hematuria: Secondary | ICD-10-CM | POA: Diagnosis not present

## 2019-07-29 DIAGNOSIS — G800 Spastic quadriplegic cerebral palsy: Secondary | ICD-10-CM | POA: Diagnosis not present

## 2019-07-29 DIAGNOSIS — R7309 Other abnormal glucose: Secondary | ICD-10-CM | POA: Diagnosis not present

## 2019-07-29 DIAGNOSIS — Z79899 Other long term (current) drug therapy: Secondary | ICD-10-CM | POA: Diagnosis not present

## 2019-07-30 LAB — CBC WITH DIFFERENTIAL/PLATELET
Absolute Monocytes: 191 cells/uL — ABNORMAL LOW (ref 200–950)
Basophils Absolute: 20 cells/uL (ref 0–200)
Basophils Relative: 0.5 %
Eosinophils Absolute: 109 cells/uL (ref 15–500)
Eosinophils Relative: 2.8 %
HCT: 36.1 % (ref 35.0–45.0)
Hemoglobin: 11.6 g/dL — ABNORMAL LOW (ref 11.7–15.5)
Lymphs Abs: 2122 cells/uL (ref 850–3900)
MCH: 29.3 pg (ref 27.0–33.0)
MCHC: 32.1 g/dL (ref 32.0–36.0)
MCV: 91.2 fL (ref 80.0–100.0)
MPV: 10 fL (ref 7.5–12.5)
Monocytes Relative: 4.9 %
Neutro Abs: 1459 cells/uL — ABNORMAL LOW (ref 1500–7800)
Neutrophils Relative %: 37.4 %
Platelets: 176 10*3/uL (ref 140–400)
RBC: 3.96 10*6/uL (ref 3.80–5.10)
RDW: 13.6 % (ref 11.0–15.0)
Total Lymphocyte: 54.4 %
WBC: 3.9 10*3/uL (ref 3.8–10.8)

## 2019-07-30 LAB — COMPLETE METABOLIC PANEL WITH GFR
AG Ratio: 1.9 (calc) (ref 1.0–2.5)
ALT: 12 U/L (ref 6–29)
AST: 19 U/L (ref 10–35)
Albumin: 4.1 g/dL (ref 3.6–5.1)
Alkaline phosphatase (APISO): 67 U/L (ref 37–153)
BUN/Creatinine Ratio: 42 (calc) — ABNORMAL HIGH (ref 6–22)
BUN: 20 mg/dL (ref 7–25)
CO2: 27 mmol/L (ref 20–32)
Calcium: 9.2 mg/dL (ref 8.6–10.4)
Chloride: 106 mmol/L (ref 98–110)
Creat: 0.48 mg/dL — ABNORMAL LOW (ref 0.50–1.05)
GFR, Est African American: 131 mL/min/{1.73_m2} (ref >=60)
GFR, Est Non African American: 113 mL/min/{1.73_m2} (ref >=60)
Globulin: 2.2 g/dL (calc) (ref 1.9–3.7)
Glucose, Bld: 89 mg/dL (ref 65–99)
Potassium: 4.3 mmol/L (ref 3.5–5.3)
Sodium: 140 mmol/L (ref 135–146)
Total Bilirubin: 0.3 mg/dL (ref 0.2–1.2)
Total Protein: 6.3 g/dL (ref 6.1–8.1)

## 2019-07-30 LAB — HEMOGLOBIN A1C
Hgb A1c MFr Bld: 4.9 % of total Hgb (ref <5.7)
Mean Plasma Glucose: 94 (calc)
eAG (mmol/L): 5.2 (calc)

## 2019-07-30 LAB — LIPID PANEL
Cholesterol: 142 mg/dL (ref <200)
HDL: 50 mg/dL (ref >=50)
LDL Cholesterol (Calc): 75 mg/dL (calc)
Non-HDL Cholesterol (Calc): 92 mg/dL (calc) (ref <130)
Total CHOL/HDL Ratio: 2.8 (calc) (ref <5.0)
Triglycerides: 89 mg/dL (ref <150)

## 2019-07-30 LAB — TSH: TSH: 1.07 mIU/L

## 2019-08-03 ENCOUNTER — Other Ambulatory Visit: Payer: Self-pay

## 2019-08-03 ENCOUNTER — Encounter: Payer: Self-pay | Admitting: Family Medicine

## 2019-08-03 ENCOUNTER — Ambulatory Visit (INDEPENDENT_AMBULATORY_CARE_PROVIDER_SITE_OTHER): Payer: Medicare Other | Admitting: Family Medicine

## 2019-08-03 VITALS — BP 108/59 | HR 83

## 2019-08-03 DIAGNOSIS — F5105 Insomnia due to other mental disorder: Secondary | ICD-10-CM | POA: Diagnosis not present

## 2019-08-03 DIAGNOSIS — G800 Spastic quadriplegic cerebral palsy: Secondary | ICD-10-CM | POA: Diagnosis not present

## 2019-08-03 DIAGNOSIS — N3010 Interstitial cystitis (chronic) without hematuria: Secondary | ICD-10-CM

## 2019-08-03 DIAGNOSIS — F419 Anxiety disorder, unspecified: Secondary | ICD-10-CM

## 2019-08-03 DIAGNOSIS — M8949 Other hypertrophic osteoarthropathy, multiple sites: Secondary | ICD-10-CM

## 2019-08-03 DIAGNOSIS — F79 Unspecified intellectual disabilities: Secondary | ICD-10-CM | POA: Diagnosis not present

## 2019-08-03 DIAGNOSIS — F99 Mental disorder, not otherwise specified: Secondary | ICD-10-CM

## 2019-08-03 DIAGNOSIS — M159 Polyosteoarthritis, unspecified: Secondary | ICD-10-CM

## 2019-08-03 NOTE — Assessment & Plan Note (Signed)
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

## 2019-08-03 NOTE — Patient Instructions (Addendum)
Thank you for coming to the office today.  Please schedule a Follow-up Appointment to: Return in about 6 months (around 02/01/2020) for 6 month follow-up refills.  If you have any other questions or concerns, please feel free to call the office or send a message through [place]. You may also schedule an earlier appointment if necessary.  Additionally, you may be receiving a survey about your experience at our office within a few days to 1 week by e-mail or mail. We value your feedback.  Nobie Putnam, DO [place]

## 2019-08-03 NOTE — Assessment & Plan Note (Signed)
Currently controlled on Methenamine 1g daily up to BID now Prior work-up from [facility], has had prior dx UTI and nephrolithiasis, ultimately seems to be symptoms secondary to cystitis  Plan Continue current course with Methenamine 1g daily vs BID - caregiver agrees that this seems to be working, will continue as it is covered/ lower cost option for chronic problem - Also on Doxazosin for improve urinary flow

## 2019-08-03 NOTE — Assessment & Plan Note (Signed)
Chronic problem, in setting of congenital cerebral palsy Followed by [facility] for botox injections q 4 months Non ambulatory, Has brace supports for legs, s/p prior surgeries for muscle tenotomy Cognitive and intellectual developmental delay as well in setting of CP Continues multi symptom management on meds 

## 2019-08-03 NOTE — Assessment & Plan Note (Signed)
Recently still mildly worse due to limited participation in day program outdoors activity due to [place] precautions Mostly controlled on BDZ daily Also Trazodone nightly Secondary to developmental / intellectual delay with CP Refilled Alprazolam today 0.5mg  BID, with refills Follow-up - advised may consider SSRI in future but we agree to avoid additional medication

## 2019-08-03 NOTE — Assessment & Plan Note (Signed)
Stable, controlled on BDZ daily Secondary to developmental / intellectual delay with CP Continue Trazodone today 50mg  at night, with refills Follow-up

## 2019-08-03 NOTE — Progress Notes (Signed)
Subjective:    Patient ID: Loretta Savage, female    DOB: May 02, 1966, 53 y.o.   MRN: [id number]  Loretta Savage is a 53 y.o. female presenting on 08/03/2019 for Cerebral Palsy   HPI   History provided by patient's legal guardian/mother - Loretta Savage, as patient has Cerebral Palsy and is non verbal.  Lab results reviewed Normal A1c and Lipids. Mother asking if Ensure is too much sugar.  History of anemia, now improved Hgb 11.6  Cerebral Palsy, Spastic quadriplegia / Intellectual Disability - Background historychronic developmental disorder since birth with cerebral palsy and intellectual disability.Limited speech and following commands. See prior note and chart for further medical background. - Today no new concerns. Still lives with mother as primary caregiver - For spasticquadriplegia (due to CP), goes to [facility], 4 times yearly for botox injections - She is limited by mobility due to extremities, she has bilateral lower leg up to knee braces that provide support, she is non ambulatory. She has been attending day program since age 21-4 -now unable to go to [facility] weekday program, has made her more irritable and anxious  INSOMNIA / Anxiety - reports chronic issues that seem secondary to her developmental disorder. - See above - more anxious less activity now due to stay at home restrictions w/ COVID19 precaution - Currently remains stable on rare PRN alprazolam 0.5mg  BID and Trazodone 50 mg nightly asking about agitation during day since cannot go out to day program, but she does not want to add more medicines at this time.  Chronic InterstitialBladder Cystitis Last update in April 2020 - Taking Methenamine 1g daily in AM and PRN in PM, seems to be working fairly well - does not seem to be a problem or cause any reported episodes of crying spells due to cystitis   Additionally heat rash on bottom, uses ice pack, prolong sitting. Using barrier creams PRN. Also using corn  starch powder as needed - she has some anti fungal medicated powder if needed. She uses vaseline as well.   Health Maintenance: She cannot do mammogram due to cerebral palsy, she only had partial locally at [facility], she will need to go to [facility] and do a laying down mammogram.  Depression screen [facility] 2/9 02/03/2019 02/04/2018  Decreased Interest 3 0  Down, Depressed, Hopeless 2 0  PHQ - 2 Score 5 0  Altered sleeping 0 -  Tired, decreased energy 0 -  Change in appetite 0 -  Feeling bad or failure about yourself  0 -  Trouble concentrating 0 -  Moving slowly or fidgety/restless 0 -  PHQ-9 Score 5 -    Past Medical History:  Diagnosis Date   Anxiety    Chronic kidney disease    stone   Colon polyp    GERD (gastroesophageal reflux disease)    Urinary incontinence    Past Surgical History:  Procedure Laterality Date   ABDOMINAL HYSTERECTOMY  2006   BACK SURGERY  03/1991   HIP ADDUCTOR TENOTOMY  2004   REPAIR QUADRICEPS / HAMSTRING MUSCLE  1992   Social History   Socioeconomic History   Marital status: Single    Spouse name: Not on file   Number of children: Not on file   Years of education: Vocational Program   Highest education level: Associate degree: occupational, Hotel manager, or vocational program  Occupational History   Not on file  Social Needs   Financial resource strain: Not on file   Food insecurity  Worry: Not on file    Inability: Not on file   Transportation needs    Medical: Not on file    Non-medical: Not on file  Tobacco Use   Smoking status: Never Smoker   Smokeless tobacco: Never Used  Substance and Sexual Activity   Alcohol use: Never    Frequency: Never   Drug use: Never   Sexual activity: Never  Lifestyle   Physical activity    Days per week: Not on file    Minutes per session: Not on file   Stress: Not on file  Relationships   Social connections    Talks on phone: Not on file    Gets together: Not on file     Attends religious service: Not on file    Active member of club or organization: Not on file    Attends meetings of clubs or organizations: Not on file    Relationship status: Not on file   Intimate partner violence    Fear of current or ex partner: Not on file    Emotionally abused: Not on file    Physically abused: Not on file    Forced sexual activity: Not on file  Other Topics Concern   Not on file  Social History Narrative   Not on file   Family History  Problem Relation Age of Onset   Colon polyps Mother    Heart disease Father    Heart attack Father    Thyroid disease Maternal Grandmother    Heart disease Maternal Grandfather    Heart disease Paternal Grandmother    Heart disease Paternal Grandfather    Current Outpatient Medications on File Prior to Visit  Medication Sig   acetaminophen (TYLENOL) 500 MG tablet Take by mouth.   ALPRAZolam (XANAX) 0.5 MG tablet TAKE 1 TABLET (0.5 MG TOTAL) BY MOUTH 2 (TWO) TIMES DAILY. FOR ANXIETY   aspirin 81 MG EC tablet Take by mouth.   bisacodyl (MAGIC BULLETS) 10 MG suppository Place rectally.   Calcium-Vitamin D 600-200 MG-UNIT tablet Take by mouth.   Cholecalciferol (D2000 ULTRA STRENGTH) 50 MCG (2000 UT) CAPS Take by mouth.   Dexlansoprazole (DEXILANT) 30 MG capsule 30 mg daily.   docusate sodium (COLACE CLEAR) 50 MG capsule Take by mouth.   doxazosin (CARDURA) 2 MG tablet TAKE 1 TABLET (2 MG TOTAL) BY MOUTH AT BEDTIME. FOR BLADDER.   fluticasone (FLONASE) 50 MCG/ACT nasal spray SPRAY 1 SPRAY INTO EACH NOSTRIL TWICE A DAY   levocetirizine (XYZAL) 5 MG tablet Take 5 mg by mouth daily.   loratadine (CLARITIN) 10 MG tablet Take by mouth.   meloxicam (MOBIC) 15 MG tablet TAKE 1 TABLET BY MOUTH DAILY FOR ARTHRITIS   methenamine (HIPREX) 1 g tablet Take 1 tablet (1 g total) by mouth 2 (two) times daily with a meal.   sucralfate (CARAFATE) 1 g tablet TAKE 1 TABLET BY MOUTH AT 10:30AM AND 1 TABLET AT BEDTIME  EVERY DAY   traZODone (DESYREL) 50 MG tablet TAKE 1 TABLET (50 MG TOTAL) BY MOUTH AT BEDTIME. FOR SLEEP   lidocaine-prilocaine (EMLA) cream    No current facility-administered medications on file prior to visit.     Review of Systems Per HPI unless specifically indicated above      Objective:    BP (!) 108/59    Pulse 83   Wt Readings from Last 3 Encounters:  02/04/18 114 lb (51.7 kg)    Physical Exam Vitals signs and nursing note reviewed.  Constitutional:      General: She is not in acute distress.    Appearance: She is not diaphoretic.     Comments: Small stature with some dysmorphic features, comfortable, seated in wheelchair  HENT:     Head: Atraumatic.  Eyes:     General:        Right eye: No discharge.        Left eye: No discharge.     Conjunctiva/sclera: Conjunctivae normal.  Neck:     Thyroid: No thyromegaly.  Cardiovascular:     Rate and Rhythm: Normal rate and regular rhythm.     Heart sounds: Normal heart sounds. No murmur.  Pulmonary:     Effort: Pulmonary effort is normal. No respiratory distress.     Breath sounds: No wheezing or rales.  Abdominal:     General: Bowel sounds are normal. There is no distension.     Tenderness: There is no abdominal tenderness.  Musculoskeletal:        General: Deformity (bilateral lower extremity in rigid braces) present.  Lymphadenopathy:     Cervical: No cervical adenopathy.  Skin:    General: Skin is warm and dry.     Findings: No erythema or rash.  Neurological:     Mental Status: She is alert.     Motor: Abnormal muscle tone (chronic CP) present.     Comments: Does not follow most commands  Psychiatric:     Comments: Well groomed, poor eye contact, non verbal, does not appear agitated    Results for orders placed or performed in visit on 07/28/19  TSH  Result Value Ref Range   TSH 1.07 mIU/L  Lipid panel  Result Value Ref Range   Cholesterol 142 <200 mg/dL   HDL 50 > OR = 50 mg/dL   Triglycerides  89 <150 mg/dL   LDL Cholesterol (Calc) 75 mg/dL (calc)   Total CHOL/HDL Ratio 2.8 <5.0 (calc)   Non-HDL Cholesterol (Calc) 92 <130 mg/dL (calc)  COMPLETE METABOLIC PANEL WITH GFR  Result Value Ref Range   Glucose, Bld 89 65 - 99 mg/dL   BUN 20 7 - 25 mg/dL   Creat 0.48 (L) 0.50 - 1.05 mg/dL   GFR, Est Non African American 113 > OR = 60 mL/min/1.74m2   GFR, Est African American 131 > OR = 60 mL/min/1.15m2   BUN/Creatinine Ratio 42 (H) 6 - 22 (calc)   Sodium 140 135 - 146 mmol/L   Potassium 4.3 3.5 - 5.3 mmol/L   Chloride 106 98 - 110 mmol/L   CO2 27 20 - 32 mmol/L   Calcium 9.2 8.6 - 10.4 mg/dL   Total Protein 6.3 6.1 - 8.1 g/dL   Albumin 4.1 3.6 - 5.1 g/dL   Globulin 2.2 1.9 - 3.7 g/dL (calc)   AG Ratio 1.9 1.0 - 2.5 (calc)   Total Bilirubin 0.3 0.2 - 1.2 mg/dL   Alkaline phosphatase (APISO) 67 37 - 153 U/L   AST 19 10 - 35 U/L   ALT 12 6 - 29 U/L  CBC with Differential/Platelet  Result Value Ref Range   WBC 3.9 3.8 - 10.8 Thousand/uL   RBC 3.96 3.80 - 5.10 Million/uL   Hemoglobin 11.6 (L) 11.7 - 15.5 g/dL   HCT 36.1 35.0 - 45.0 %   MCV 91.2 80.0 - 100.0 fL   MCH 29.3 27.0 - 33.0 pg   MCHC 32.1 32.0 - 36.0 g/dL   RDW 13.6 11.0 - 15.0 %   Platelets  176 140 - 400 Thousand/uL   MPV 10.0 7.5 - 12.5 fL   Neutro Abs 1,459 (L) 1,500 - 7,800 cells/uL   Lymphs Abs 2,122 850 - 3,900 cells/uL   Absolute Monocytes 191 (L) 200 - 950 cells/uL   Eosinophils Absolute 109 15 - 500 cells/uL   Basophils Absolute 20 0 - 200 cells/uL   Neutrophils Relative % 37.4 %   Total Lymphocyte 54.4 %   Monocytes Relative 4.9 %   Eosinophils Relative 2.8 %   Basophils Relative 0.5 %  Hemoglobin A1c  Result Value Ref Range   Hgb A1c MFr Bld 4.9 <5.7 % of total Hgb   Mean Plasma Glucose 94 (calc)   eAG (mmol/L) 5.2 (calc)      Assessment & Plan:   Problem List Items Addressed This Visit    Anxiety    Recently still mildly worse due to limited participation in day program outdoors activity due  to COVID19 precautions Mostly controlled on BDZ daily Also Trazodone nightly Secondary to developmental / intellectual delay with CP Refilled Alprazolam today 0.5mg  BID, with refills Follow-up - advised may consider SSRI in future but we agree to avoid additional medication      Chronic interstitial cystitis    Currently controlled on Methenamine 1g daily up to BID now Prior work-up from [facility], has had prior dx UTI and nephrolithiasis, ultimately seems to be symptoms secondary to cystitis  Plan Continue current course with Methenamine 1g daily vs BID - caregiver agrees that this seems to be working, will continue as it is covered/ lower cost option for chronic problem - Also on Doxazosin for improve urinary flow      Insomnia    Stable, controlled on BDZ daily Secondary to developmental / intellectual delay with CP Continue Trazodone today 50mg  at night, with refills Follow-up      Intellectual disability    Stable chronic problem, 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      Primary osteoarthritis involving multiple joints    Chronic problem, multiple joints including shoulders Secondary to underlying developmental delay / disorder and CP Continue Meloxicam 15mg  daily Followed by [facility]      Spastic quadriplegic cerebral palsy ([place]) - Primary    Chronic problem, in setting of congenital cerebral palsy Followed by [facility] for botox injections q 4 months Non ambulatory, Has brace supports for legs, s/p prior surgeries for muscle tenotomy Cognitive and intellectual developmental delay as well in setting of CP Continues multi symptom management on meds         No orders of the defined types were placed in this encounter.   Follow up plan: Return in about 6 months (around 02/01/2020) for 6 month follow-up refills.  Nobie Putnam, DO [facility] 08/03/2019, 10:44 AM

## 2019-08-03 NOTE — Assessment & Plan Note (Signed)
Chronic problem, multiple joints including shoulders Secondary to underlying developmental delay / disorder and CP Continue Meloxicam 15mg  daily Followed by [facility]

## 2019-08-05 ENCOUNTER — Other Ambulatory Visit: Payer: Self-pay | Admitting: Family Medicine

## 2019-08-05 DIAGNOSIS — N3010 Interstitial cystitis (chronic) without hematuria: Secondary | ICD-10-CM

## 2019-08-10 DIAGNOSIS — Z6828 Body mass index (BMI) 28.0-28.9, adult: Secondary | ICD-10-CM | POA: Diagnosis not present

## 2019-08-10 DIAGNOSIS — G808 Other cerebral palsy: Secondary | ICD-10-CM | POA: Diagnosis not present

## 2019-08-21 ENCOUNTER — Other Ambulatory Visit: Payer: Self-pay | Admitting: Family Medicine

## 2019-08-21 DIAGNOSIS — J302 Other seasonal allergic rhinitis: Secondary | ICD-10-CM

## 2019-08-23 ENCOUNTER — Other Ambulatory Visit: Payer: Self-pay | Admitting: Family Medicine

## 2019-08-23 DIAGNOSIS — F419 Anxiety disorder, unspecified: Secondary | ICD-10-CM

## 2019-08-23 DIAGNOSIS — F79 Unspecified intellectual disabilities: Secondary | ICD-10-CM

## 2019-08-25 ENCOUNTER — Other Ambulatory Visit: Payer: Self-pay | Admitting: Family Medicine

## 2019-08-25 DIAGNOSIS — F79 Unspecified intellectual disabilities: Secondary | ICD-10-CM

## 2019-08-25 DIAGNOSIS — F419 Anxiety disorder, unspecified: Secondary | ICD-10-CM

## 2019-11-15 ENCOUNTER — Other Ambulatory Visit: Payer: Self-pay | Admitting: Family Medicine

## 2019-11-15 DIAGNOSIS — F5105 Insomnia due to other mental disorder: Secondary | ICD-10-CM

## 2019-11-23 ENCOUNTER — Encounter: Payer: Self-pay | Admitting: Family Medicine

## 2019-11-23 DIAGNOSIS — M2450 Contracture, unspecified joint: Secondary | ICD-10-CM | POA: Insufficient documentation

## 2019-11-23 DIAGNOSIS — M419 Scoliosis, unspecified: Secondary | ICD-10-CM | POA: Insufficient documentation

## 2019-11-23 DIAGNOSIS — R159 Full incontinence of feces: Secondary | ICD-10-CM | POA: Insufficient documentation

## 2019-11-30 ENCOUNTER — Ambulatory Visit (INDEPENDENT_AMBULATORY_CARE_PROVIDER_SITE_OTHER): Payer: Medicare Other | Admitting: Family Medicine

## 2019-11-30 ENCOUNTER — Other Ambulatory Visit: Payer: Self-pay

## 2019-11-30 ENCOUNTER — Encounter: Payer: Self-pay | Admitting: Family Medicine

## 2019-11-30 VITALS — Ht <= 58 in | Wt 123.0 lb

## 2019-11-30 DIAGNOSIS — G800 Spastic quadriplegic cerebral palsy: Secondary | ICD-10-CM | POA: Diagnosis not present

## 2019-11-30 DIAGNOSIS — M2450 Contracture, unspecified joint: Secondary | ICD-10-CM | POA: Diagnosis not present

## 2019-11-30 DIAGNOSIS — F72 Severe intellectual disabilities: Secondary | ICD-10-CM

## 2019-11-30 DIAGNOSIS — M8949 Other hypertrophic osteoarthropathy, multiple sites: Secondary | ICD-10-CM | POA: Diagnosis not present

## 2019-11-30 DIAGNOSIS — R159 Full incontinence of feces: Secondary | ICD-10-CM

## 2019-11-30 DIAGNOSIS — R32 Unspecified urinary incontinence: Secondary | ICD-10-CM

## 2019-11-30 DIAGNOSIS — M159 Polyosteoarthritis, unspecified: Secondary | ICD-10-CM

## 2019-11-30 NOTE — Progress Notes (Signed)
Virtual Visit via Telephone The purpose of this virtual visit is to provide medical care while limiting exposure to the novel coronavirus (COVID19) for both patient and office staff.  Consent was obtained for phone visit:  Yes.   Answered questions that patient had about telehealth interaction:  Yes.   I discussed the limitations, risks, security and privacy concerns of performing an evaluation and management service by telephone. I also discussed with the patient that there may be a patient responsible charge related to this service. The patient expressed understanding and agreed to proceed.  Patient Location: Home Provider Location: [facility])  ---------------------------------------------------------------------- Chief Complaint  Patient presents with  . Cerebral Palsy    S: Reviewed CMA documentation. I have called patient and gathered additional HPI as follows:  History provided by patient's legal guardian/mother - Loretta Savage, as patient has Cerebral Palsy and is non verbal.  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 See prior note and chart for further medical background. - No new medical problems. concerns. Still lives with mother as primary caregiver - Today the goal is to discuss her mobility with regards to needing new wheelchair for her mobility  - For spasticquadriplegia (due to CP), goes to [facility], 4 times yearly for botox injections - She is limited by mobility due to all 4 extremities having significant weakness and flexion contractures, she has bilateral lower leg up to knee braces that provide support,  she is non ambulatory. - Due to her weakness, she has been unsuccessful in attempting to use cane or [place] in the past, the weakness in all extremities and muscle contractures make it so that she is unable to use cane or [place] safely  - She is a high fall risk and has had problems of falls in past, only improved by mobility with wheelchair - She does require assistance with all ADLs - including feeding / toileting / bathing hygiene / dressing - The requested wheelchair will help her reach different areas within the house such as kitchen and bathroom, that she does have difficulty reaching due to her limited mobility  - Current wheelchair used by caregiver/mother - is a heavier wheelchair that has caused significant difficulty with day to day function and mobility. Her caregiver/mother has hard time turning and pushing and lifting wheelchair, has had several almost accidents while trying to push it up ramp or load it into vehicle. Often requires assistance due to heaviness and weight of the wheelchair itself.  - The patient, Loretta Savage can propel her own wheelchair short distances, this will help with lightweight wheelchair as requested, able to do short distance mobility   Denies any known or suspected exposure to person with or possibly with COVID19.  Denies any fevers, chills, sweats, body ache, cough, shortness of breath, sinus pain or pressure, headache, abdominal pain, diarrhea  Past Medical History:  Diagnosis Date  . Anxiety   . Chronic kidney disease    stone  . Colon polyp   . GERD (gastroesophageal reflux disease)   . Urinary incontinence    Social History   Tobacco Use  . Smoking status: Never Smoker  . Smokeless tobacco: Never Used  Substance Use Topics  . Alcohol use: Never  . Drug use: Never    Current  Outpatient Medications:  .  acetaminophen (TYLENOL) 500 MG tablet, Take by mouth., Disp: , Rfl:  .  ALPRAZolam (XANAX) 0.5 MG tablet, TAKE 1 TABLET (0.5 MG TOTAL) BY  MOUTH 2 (TWO) TIMES DAILY. FOR ANXIETY, Disp: 60 tablet, Rfl: 2 .  aspirin 81 MG EC tablet, Take by mouth., Disp: , Rfl:  .  bisacodyl (MAGIC BULLETS) 10 MG suppository, Place rectally., Disp: , Rfl:  .  Calcium-Vitamin D 600-200 MG-UNIT tablet, Take by mouth., Disp: , Rfl:  .  Cholecalciferol (D2000 ULTRA STRENGTH) 50 MCG (2000 UT) CAPS, Take by mouth., Disp: , Rfl:  .  Dexlansoprazole (DEXILANT) 30 MG capsule, 30 mg daily., Disp: , Rfl:  .  docusate sodium (COLACE CLEAR) 50 MG capsule, Take by mouth., Disp: , Rfl:  .  doxazosin (CARDURA) 2 MG tablet, TAKE 1 TABLET (2 MG TOTAL) BY MOUTH AT BEDTIME. FOR BLADDER., Disp: 90 tablet, Rfl: 3 .  fluticasone (FLONASE) 50 MCG/ACT nasal spray, USE 1 SPRAY INTO EACH NOSTRIL TWICE A DAY, Disp: 48 mL, Rfl: 1 .  levocetirizine (XYZAL) 5 MG tablet, Take 5 mg by mouth daily., Disp: , Rfl: 10 .  lidocaine-prilocaine (EMLA) cream, , Disp: , Rfl:  .  loratadine (CLARITIN) 10 MG tablet, Take by mouth., Disp: , Rfl:  .  meloxicam (MOBIC) 15 MG tablet, TAKE 1 TABLET BY MOUTH DAILY FOR ARTHRITIS, Disp: 30 tablet, Rfl: 11 .  methenamine (HIPREX) 1 g tablet, TAKE 1 TABLET (1 G TOTAL) BY MOUTH 2 (TWO) TIMES DAILY WITH A MEAL., Disp: 180 tablet, Rfl: 1 .  sucralfate (CARAFATE) 1 g tablet, TAKE 1 TABLET BY MOUTH AT 10:30AM AND 1 TABLET AT BEDTIME EVERY DAY, Disp: , Rfl: 1 .  traZODone (DESYREL) 50 MG tablet, TAKE 1 TABLET (50 MG TOTAL) BY MOUTH AT BEDTIME. FOR SLEEP, Disp: 90 tablet, Rfl: 1  Depression screen [facility] 2/9 02/03/2019 02/04/2018  Decreased Interest 3 0  Down, Depressed, Hopeless 2 0  PHQ - 2 Score 5 0  Altered sleeping 0 -  Tired, decreased energy 0 -  Change in appetite 0 -  Feeling bad or failure about yourself  0 -  Trouble concentrating 0 -  Moving slowly or fidgety/restless 0 -  PHQ-9 Score 5 -    GAD 7 : Generalized Anxiety Score 02/03/2019  Nervous, Anxious, on Edge 0  Control/stop worrying 0  Worry too much - different things 1  Trouble relaxing  0  Restless 0  Easily annoyed or irritable 0  Afraid - awful might happen 0  Total GAD 7 Score 1  Anxiety Difficulty Not difficult at all    -------------------------------------------------------------------------- O: No physical exam performed due to remote telephone encounter.  Ht 4\' 5"  (1.346 m)   Wt 123 lb (55.8 kg)   BMI 30.79 kg/m    Physical exam, from previous encounter with me 08/02/20 and previous history of these chronic complaints. Additionally reviewed physical therapy note from 11/18/19  Upper Extremity - Abnormal muscle tone with chronic cerebral palsy - Muscle Strength: weakness with fair to poor generalized strength 3 out of 5 - Range of Motion: Limited range at shoulders primarily  Upper Extremity - Abnormal muscle tone with chronic cerebral palsy - Muscle Strength: weakness with fair to poor generalized strength 2 to 3 out of 5 - Range of Motion: Limited range at hips and knees  No results found for this or any previous visit (from the past 2160 hour(s)).  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 She requires maximum assistance at baseline still due to cerebral palsy and severe intellectual disability, and would require assistance with lifting for transfers. Goal is to improve function, reduce fall risk. She has limitations as documented with strength and weakness and ability to ambulate or transfer that limit from other manual  devices such as cane, [place]  She remains in wheelchair >6 hours a day. She has poor posture and positioning of head and trunk with limited control. She is unable to adjust her own position in wheelchair, and does require intermittent repositioning in bed to prevent pressure ulcers. Has history of prior sacral ulcer.  Patient has severe intellectual disability and abnormal cognition that would limit her capacity from power wheelchair or scooter. She requires maximum assistance for daily activities. She is able to propel manual wheelchair short distances, otherwise would rely on caregiver to primarily push wheelchair, therefore lightweight wheelchair with the request specifications would allow her caregiver to improve her mobility.  Orders signed and to be faxed today 11/30/19 back to [employer] in [facility] agency for processing for DME wheelchair.  ------------------------------------------------------------------------------------------------------------- Additional medical necessity statement:  My patient has a mobility limitation that significantly impairs their ability to participate in one or more of the mobility-related activities of daily living such as toileting, feeding, dressing, grooming, and bathing in customary locations in the home; AND  The mobility deficit cannot be sufficiently resolved by the use of an appropriately fitted cane or [place]; AND  Use of a manual wheelchair will significantly improve my patient's ability to participate in MRADLs and they will use it on a regular basis in the home; AND  My patient has a caregiver who is available, willing, and able to provide assistance with the wheelchair, due to the patient's limited mental capacity.   No orders of the defined types were placed in this encounter.   Follow-up: - Return as needed  Patient verbalizes understanding with the above medical recommendations including the limitation of remote medical  advice.  Specific follow-up and call-back criteria were given for patient to follow-up or seek medical care more urgently if needed.    - Time spent in direct consultation with patient on phone: 15 minutes   Nobie Putnam, [facility] 11/30/2019, 3:22 PM

## 2020-02-04 DIAGNOSIS — U071 COVID-19: Secondary | ICD-10-CM | POA: Diagnosis not present

## 2020-02-04 DIAGNOSIS — Z03818 Encounter for observation for suspected exposure to other biological agents ruled out: Secondary | ICD-10-CM | POA: Diagnosis not present

## 2020-02-04 DIAGNOSIS — J019 Acute sinusitis, unspecified: Secondary | ICD-10-CM | POA: Diagnosis not present

## 2020-02-10 ENCOUNTER — Telehealth: Payer: Self-pay

## 2020-02-10 ENCOUNTER — Other Ambulatory Visit: Payer: Self-pay | Admitting: Family Medicine

## 2020-02-10 DIAGNOSIS — M159 Polyosteoarthritis, unspecified: Secondary | ICD-10-CM

## 2020-02-10 NOTE — Telephone Encounter (Signed)
Copied from [place] [id number]. Topic: General - Other >> Feb 09, 2020  4:12 PM [name] wrote: Reason for CRM: Patient mother Loretta Savage called to say that she have been diagnosed with covid  and both of them are having nausea and vomiting and would like to be prescribed something for these issues. Please advise Patient's apt is scheduled for tomorrow on 02/11/2020 around 11:20 am.

## 2020-02-11 ENCOUNTER — Telehealth (INDEPENDENT_AMBULATORY_CARE_PROVIDER_SITE_OTHER): Payer: Medicare Other | Admitting: Family Medicine

## 2020-02-11 ENCOUNTER — Other Ambulatory Visit: Payer: Self-pay

## 2020-02-11 ENCOUNTER — Encounter: Payer: Self-pay | Admitting: Family Medicine

## 2020-02-11 DIAGNOSIS — R112 Nausea with vomiting, unspecified: Secondary | ICD-10-CM | POA: Diagnosis not present

## 2020-02-11 DIAGNOSIS — U071 COVID-19: Secondary | ICD-10-CM | POA: Diagnosis not present

## 2020-02-11 MED ORDER — ONDANSETRON 4 MG PO TBDP: 4.0000 mg | ORAL_TABLET | Freq: Three times a day (TID) | ORAL | 0 refills | Status: DC | PRN

## 2020-02-11 NOTE — Progress Notes (Signed)
Virtual Visit via Telephone The purpose of this virtual visit is to provide medical care while limiting exposure to the novel coronavirus (COVID19) for both patient and office staff.  Consent was obtained for phone visit:  Yes.   Answered questions that patient had about telehealth interaction:  Yes.   I discussed the limitations, risks, security and privacy concerns of performing an evaluation and management service by telephone. I also discussed with the patient that there may be a patient responsible charge related to this service. The patient expressed understanding and agreed to proceed.  Patient Location: Home Provider Location: [facility])   ---------------------------------------------------------------------- Chief Complaint  Patient presents with  . Nausea    throwing up onset week some HA    S: Reviewed CMA documentation. I have called patient and gathered additional HPI as follows:  History provided by mother, caregiver - Raylin Waitkus provided.  COVID19 / Nausea Vomiting Within past 1 week, [facility] - COVID19 positive. She had cold URI symptoms and nausea vomiting. Seems slightly improved. Sore throat, nausea, poor PO intake but has maintained sufficient intake as reported. Taking liquids including ensure for nutrition  Denies any fevers, chills, sweats, body ache, shortness of breath, sinus pain or pressure, headache, abdominal pain, diarrhea  -------------------------------------------------------------------------- O: No physical exam performed due to remote telephone encounter.  -------------------------------------------------------------------------- A&P:   COVID-19 Nausea vomiting At risk mild dehydration Without history of pulmonary symptoms or high risk features Improving PO intake now with liquids, ensure for nutrition Rx Zofran ODT 4-8mg  TID PRN for nausea, improve nutrition Continue quarantine protocol Future  consider COVID19 vaccine   No orders of the defined types were placed in this encounter.   If symptoms do not resolve or significantly improve OR if WORSENING - fever / cough - or worsening shortness of breath - then should contact us and seek advice on next steps in treatment at home vs where/when to seek care at Urgent Care or Hospital ED for further intervention and possible testing if indicated.  Patient verbalizes understanding with the above medical recommendations including the limitation of remote medical advice.  Specific follow-up / call-back criteria were given for patient to follow-up or seek medical care more urgently if needed.   - Time spent in direct consultation with patient on phone: 9 minutes   Nobie Putnam, [facility] 02/11/2020, 12:00 PM

## 2020-02-13 ENCOUNTER — Emergency Department: Payer: Medicare Other

## 2020-02-13 ENCOUNTER — Emergency Department
Admission: EM | Admit: 2020-02-13 | Discharge: 2020-02-13 | Disposition: A | Discharge status: Home / Self Care | Payer: Medicare Other | Attending: Emergency Medicine | Admitting: Emergency Medicine

## 2020-02-13 ENCOUNTER — Encounter: Payer: Self-pay | Admitting: Emergency Medicine

## 2020-02-13 DIAGNOSIS — Z79899 Other long term (current) drug therapy: Secondary | ICD-10-CM | POA: Insufficient documentation

## 2020-02-13 DIAGNOSIS — F72 Severe intellectual disabilities: Secondary | ICD-10-CM | POA: Diagnosis not present

## 2020-02-13 DIAGNOSIS — R112 Nausea with vomiting, unspecified: Secondary | ICD-10-CM | POA: Insufficient documentation

## 2020-02-13 DIAGNOSIS — G809 Cerebral palsy, unspecified: Secondary | ICD-10-CM | POA: Diagnosis not present

## 2020-02-13 DIAGNOSIS — R109 Unspecified abdominal pain: Secondary | ICD-10-CM | POA: Diagnosis not present

## 2020-02-13 DIAGNOSIS — U071 COVID-19: Secondary | ICD-10-CM | POA: Insufficient documentation

## 2020-02-13 DIAGNOSIS — R1110 Vomiting, unspecified: Secondary | ICD-10-CM

## 2020-02-13 LAB — COMPREHENSIVE METABOLIC PANEL
ALT: 71 U/L — ABNORMAL HIGH (ref 0–44)
AST: 52 U/L — ABNORMAL HIGH (ref 15–41)
Albumin: 4.1 g/dL (ref 3.5–5.0)
Alkaline Phosphatase: 65 U/L (ref 38–126)
Anion gap: 9 (ref 5–15)
BUN: 23 mg/dL — ABNORMAL HIGH (ref 6–20)
CO2: 26 mmol/L (ref 22–32)
Calcium: 9.4 mg/dL (ref 8.9–10.3)
Chloride: 106 mmol/L (ref 98–111)
Creatinine, Ser: 0.45 mg/dL (ref 0.44–1.00)
GFR calc Af Amer: >60 mL/min (ref >60)
GFR calc non Af Amer: >60 mL/min (ref >60)
Glucose, Bld: 91 mg/dL (ref 70–99)
Potassium: 4 mmol/L (ref 3.5–5.1)
Sodium: 141 mmol/L (ref 135–145)
Total Bilirubin: 0.8 mg/dL (ref 0.3–1.2)
Total Protein: 7.6 g/dL (ref 6.5–8.1)

## 2020-02-13 LAB — CBC WITH DIFFERENTIAL/PLATELET
Abs Immature Granulocytes: 0.02 10*3/uL (ref 0.00–0.07)
Basophils Absolute: 0 10*3/uL (ref 0.0–0.1)
Basophils Relative: 0 %
Eosinophils Absolute: 0 10*3/uL (ref 0.0–0.5)
Eosinophils Relative: 0 %
HCT: 41.7 % (ref 36.0–46.0)
Hemoglobin: 14.1 g/dL (ref 12.0–15.0)
Immature Granulocytes: 0 %
Lymphocytes Relative: 34 %
Lymphs Abs: 2.1 10*3/uL (ref 0.7–4.0)
MCH: 30.3 pg (ref 26.0–34.0)
MCHC: 33.8 g/dL (ref 30.0–36.0)
MCV: 89.5 fL (ref 80.0–100.0)
Monocytes Absolute: 0.5 10*3/uL (ref 0.1–1.0)
Monocytes Relative: 8 %
Neutro Abs: 3.6 10*3/uL (ref 1.7–7.7)
Neutrophils Relative %: 58 %
Platelets: 267 10*3/uL (ref 150–400)
RBC: 4.66 MIL/uL (ref 3.87–5.11)
RDW: 13.7 % (ref 11.5–15.5)
WBC: 6.3 10*3/uL (ref 4.0–10.5)
nRBC: 0 % (ref 0.0–0.2)

## 2020-02-13 LAB — LIPASE, BLOOD: Lipase: 30 U/L (ref 11–51)

## 2020-02-13 MED ORDER — SODIUM CHLORIDE 0.9 % IV BOLUS
1000.0000 mL | Freq: Once | INTRAVENOUS | Status: AC
  Administered 2020-02-13: 13:00:00 1000 mL via INTRAVENOUS

## 2020-02-13 MED ORDER — ONDANSETRON HCL 4 MG/2ML IJ SOLN
4.0000 mg | Freq: Once | INTRAMUSCULAR | Status: AC
  Administered 2020-02-13: 4 mg via INTRAVENOUS
  Filled 2020-02-13: qty 2

## 2020-02-13 MED ORDER — ENSURE ENLIVE PO LIQD
237.0000 mL | Freq: Once | ORAL | Status: AC
  Administered 2020-02-13: 237 mL via ORAL

## 2020-02-13 MED ORDER — SODIUM CHLORIDE 0.9 % IV BOLUS
1000.0000 mL | Freq: Once | INTRAVENOUS | Status: AC
  Administered 2020-02-13: 1000 mL via INTRAVENOUS

## 2020-02-13 MED ORDER — ONDANSETRON 4 MG PO TBDP: 4.0000 mg | ORAL_TABLET | Freq: Three times a day (TID) | ORAL | 0 refills | Status: DC | PRN

## 2020-02-13 NOTE — ED Notes (Signed)
First Nurse Note: Pt positive for COVID. Pt mother states that pt has been vomiting and not wanting to eat.

## 2020-02-13 NOTE — ED Notes (Signed)
IVF infusing at this time. Mother asking for additional nausea medication

## 2020-02-13 NOTE — ED Notes (Signed)
Nurse Secretary has called Dietary and requested Ensure be brought up ASAP for patient.

## 2020-02-13 NOTE — ED Provider Notes (Signed)
[facility] Emergency Department Provider Note   ____________________________________________   I have reviewed the triage vital signs and the nursing notes.   HISTORY  Chief Complaint Emesis and COVID Positive   History limited by and level 5 caveat due to: chronic medical problems, history obtained from mother   HPI Loretta Savage is a 54 y.o. female who presents to the emergency department today because of concerns for vomiting as well as decreased oral intake.  The patient was diagnosed with Covid roughly 10 days ago.  The patient had some vomiting over the past couple of days.  They did discuss with primary care doctor who prescribed nausea medicine although they did not have a chance to pick it up until today.  Mother states that the patient will not take any medication from her.  Patient has also been moaning. COVID symptoms started roughly 2 weeks ago.  Records reviewed. Per medical record review patient has a history of cerebral palsy, intellectual disability.  Past Medical History:  Diagnosis Date  . Anxiety   . Chronic kidney disease    stone  . Colon polyp   . GERD (gastroesophageal reflux disease)   . Urinary incontinence     Patient Active Problem List   Diagnosis Date Noted  . Scoliosis 11/23/2019  . Bowel and bladder incontinence 11/23/2019  . 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 Anxiety 02/04/2018  . Insomnia 02/04/2018  . 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 Spastic quadriplegic cerebral palsy ([place]) 02/04/2018  . GERD (gastroesophageal reflux disease) 02/04/2018  . 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 Seasonal allergies 02/04/2018  . Chronic interstitial cystitis 02/04/2018    Past Surgical History:  Procedure Laterality Date  . ABDOMINAL HYSTERECTOMY  2006  . BACK SURGERY  03/1991  . HIP ADDUCTOR TENOTOMY  2004  . REPAIR QUADRICEPS / HAMSTRING MUSCLE  1992    Prior  to Admission medications   Medication Sig Start Date End Date Taking? Authorizing Provider  acetaminophen (TYLENOL) 500 MG tablet Take by mouth.    [provider]  ALPRAZolam (XANAX) 0.5 MG tablet TAKE 1 TABLET (0.5 MG TOTAL) BY MOUTH 2 (TWO) TIMES DAILY. FOR ANXIETY Patient not taking: Reported on 02/11/2020 08/25/19   Olin Hauser, DO  aspirin 81 MG EC tablet Take by mouth.    [provider]  bisacodyl (MAGIC BULLETS) 10 MG suppository Place rectally.    [provider]  Calcium-Vitamin D 600-200 MG-UNIT tablet Take by mouth.    [provider]  Cholecalciferol (D2000 ULTRA STRENGTH) 50 MCG (2000 UT) CAPS Take by mouth.    [provider]  Dexlansoprazole (DEXILANT) 30 MG capsule 30 mg daily. 11/27/15   [provider]  docusate sodium (COLACE CLEAR) 50 MG capsule Take by mouth.    [provider]  doxazosin (CARDURA) 2 MG tablet TAKE 1 TABLET (2 MG TOTAL) BY MOUTH AT BEDTIME. FOR BLADDER. 03/22/19   Olin Hauser, DO  fluticasone [facility]) 50 MCG/ACT nasal spray USE 1 SPRAY INTO EACH NOSTRIL TWICE A DAY 08/22/19   Karamalegos, Devonne Doughty, DO  levocetirizine (XYZAL) 5 MG tablet Take 5 mg by mouth daily. 11/08/17   [provider]  lidocaine-prilocaine (EMLA) cream  09/07/18   [provider]  loratadine (CLARITIN) 10 MG tablet Take by mouth. 06/19/12   [provider]  meloxicam (MOBIC) 15 MG tablet TAKE 1 TABLET BY MOUTH DAILY FOR ARTHRITIS 02/10/20   Nobie Putnam  J, DO  methenamine (HIPREX) 1 g tablet TAKE 1 TABLET (1 G TOTAL) BY MOUTH 2 (TWO) TIMES DAILY WITH A MEAL. 08/05/19   Karamalegos, Devonne Doughty, DO  ondansetron (ZOFRAN ODT) 4 MG disintegrating tablet Take 1-2 tablets (4-8 mg total) by mouth every 8 (eight) hours as needed for nausea or vomiting. 02/11/20   Karamalegos, Devonne Doughty, DO  sucralfate (CARAFATE) 1 g tablet TAKE 1 TABLET BY MOUTH AT 10:30AM AND 1 TABLET AT  BEDTIME EVERY DAY 01/05/18   [provider]  traZODone (DESYREL) 50 MG tablet TAKE 1 TABLET (50 MG TOTAL) BY MOUTH AT BEDTIME. FOR SLEEP 11/16/19   Olin Hauser, DO    Allergies Neomycin-bacitracin zn-polymyx, Hydrocortisone, Levofloxacin, and Penicillins  Family History  Problem Relation Age of Onset  . Colon polyps Mother   . Heart disease Father   . Heart attack Father   . Thyroid disease Maternal Grandmother   . Heart disease Maternal Grandfather   . Heart disease Paternal Grandmother   . Heart disease Paternal Grandfather     Social History Social History   Tobacco Use  . Smoking status: Never Smoker  . Smokeless tobacco: Never Used  Substance Use Topics  . Alcohol use: Never  . Drug use: Never    Review of Systems Unable to obtain secondary to chronic medical condition. ____________________________________________   PHYSICAL EXAM:  VITAL SIGNS: ED Triage Vitals  Enc Vitals Group     BP 02/13/20 1240 115/89     Pulse Rate 02/13/20 1240 (!) 133     Resp 02/13/20 1240 20     Temp 02/13/20 1240 98.4 F (36.9 C)     Temp Source 02/13/20 1240 Axillary     SpO2 02/13/20 1240 100 %     Weight 02/13/20 1242 104 lb 15 oz (47.6 kg)     Height 02/13/20 1242 4\' 5"  (1.346 m)   Constitutional: Awake and alert.  Eyes: Conjunctivae are normal.  ENT      Head: Normocephalic and atraumatic.      Nose: No congestion/rhinnorhea.      Mouth/Throat: Mucous membranes are moist.      Neck: No stridor. Hematological/Lymphatic/Immunilogical: No cervical lymphadenopathy. Cardiovascular: Tachycardic, regular rhythm.  No murmurs, rubs, or gallops.  Respiratory: Normal respiratory effort without tachypnea nor retractions. Breath sounds are clear and equal bilaterally. No wheezes/rales/rhonchi. Gastrointestinal: Soft and non tender. No rebound. No guarding.  Genitourinary: Deferred Musculoskeletal: No lower extremity edema Neurologic:  Sequale of chronic  medical condition.  Skin:  Skin is warm, dry and intact. No rash noted. ____________________________________________    LABS (pertinent positives/negatives)  Lipase 30 CBC wbc 6.3, hgb 14.1, plt 267 CMP wnl except bun 23, ast 52, alt 71  ____________________________________________   EKG  None  ____________________________________________    RADIOLOGY  Abd x-ray/chest Normal bowel gas pattern. Streaky opacities, atelectasis vs pneumonia  ____________________________________________   PROCEDURES  Procedures  ____________________________________________   INITIAL IMPRESSION / ASSESSMENT AND PLAN / ED COURSE  Pertinent labs & imaging results that were available during my care of the patient were reviewed by me and considered in my medical decision making (see chart for details).   Patient presented to the emergency department today because of concerns for nausea vomiting in setting of recent Covid positive test.  Patient does have history of cerebral palsy and severe near intellectual disability.  History is obtained from mother.  On exam patient without any active vomiting.  She is tachycardic. Abd x-ray was obtained  which did not show a concerning bowel gas patter. They did question pneumonia in lung however I think less likely bacterial given lack of fever or leukocytosis. Will plan on giving IV fluids, zofran and reassessment. If patient clinically improves I do think it would be reasonable for discharge home.  ____________________________________________   FINAL CLINICAL IMPRESSION(S) / ED DIAGNOSES  Final diagnoses:  Vomiting, intractability of vomiting not specified, presence of nausea not specified, unspecified vomiting type  COVID-19     Note: This dictation was prepared with Dragon dictation. Any transcriptional errors that result from this process are unintentional     Nance Pear, MD 02/13/20 [phone]

## 2020-02-13 NOTE — ED Triage Notes (Signed)
Pt to ED with mother who states pt diagnosed with COVID approx 1 week ago. Mother states pt began vomiting yesterday and unable to eat/drink.

## 2020-02-15 ENCOUNTER — Other Ambulatory Visit: Payer: Self-pay | Admitting: Family Medicine

## 2020-02-15 DIAGNOSIS — J302 Other seasonal allergic rhinitis: Secondary | ICD-10-CM

## 2020-02-15 NOTE — Telephone Encounter (Signed)
Requested Prescriptions  Pending Prescriptions Disp Refills  . fluticasone (FLONASE) 50 MCG/ACT nasal spray [Pharmacy Med Name: FLUTICASONE PROP 50 MCG SPRAY] 48 mL 1    Sig: SPRAY 1 SPRAY INTO EACH NOSTRIL TWICE A DAY     Ear, Nose, and Throat: Nasal Preparations - Corticosteroids Passed - 02/15/2020  2:08 AM      Passed - Valid encounter within last 12 months    Recent Outpatient Visits          4 days ago COVID-19 virus infection   [place], DO   2 months ago Spastic quadriplegic cerebral palsy [facility])   [facility] Olin Hauser, DO   6 months ago Spastic quadriplegic cerebral palsy [facility])   [place], DO   1 year ago Spastic quadriplegic cerebral palsy [facility])   [place], DO   1 year ago Spastic quadriplegic cerebral palsy [facility])   [facility], Devonne Doughty, DO

## 2020-02-21 ENCOUNTER — Telehealth: Payer: Self-pay | Admitting: Family Medicine

## 2020-02-21 DIAGNOSIS — R112 Nausea with vomiting, unspecified: Secondary | ICD-10-CM

## 2020-02-21 DIAGNOSIS — U071 COVID-19: Secondary | ICD-10-CM

## 2020-02-21 NOTE — Telephone Encounter (Signed)
Lattie Corns, mother of the patient Is calling to let Dr. Raliegh Ip know that on Saturday she seemed to be doing better Saturday and Sunday she vomitted again.  Would lik Pe to know can Dr. Raliegh Ip call her in something for naseua and something to calm her stomach. Please advise Cb- [phone] [facility]

## 2020-02-22 NOTE — Telephone Encounter (Signed)
I did a telemedicine visit on 02/11/20 and ordered Zofran already to her pharmacy.  Also she was seen by ED on 02/13/20. They also re ordered the Zofran.  I am not sure what else she is asking for for the nausea.  Can you clarify what the patient needs? Does the Zofran not work? Only other one would be a Phenergan suppository.  For soothing her stomach, she can do OTC Pepto-Bismul option as needed  Nobie Putnam, [facility] 02/22/2020, 5:34 PM

## 2020-02-23 MED ORDER — ONDANSETRON 4 MG PO TBDP: 4.0000 mg | ORAL_TABLET | Freq: Three times a day (TID) | ORAL | 0 refills | Status: AC | PRN

## 2020-02-23 MED ORDER — PROMETHAZINE HCL 12.5 MG RE SUPP: 12.5000 mg | Freq: Three times a day (TID) | RECTAL | 1 refills | Status: DC | PRN

## 2020-02-23 NOTE — Telephone Encounter (Signed)
Left message for patient to call back  

## 2020-02-23 NOTE — Telephone Encounter (Signed)
I am concerned if she is not taking in as much nutrition. She can continue Zofran as long as improving symptoms, re order Zofran 4mg  ODT she can take 1-2 pills 3 times a day as needed. I have also ordered a small number of Phenergan suppository rectal every 8 hour as needed, only #6 suppository due to cost of these, she can use [facility]rx coupon for [facility] if cost is too high.  If not improving should go to hospital ED for evaluation for dehydration.  Nobie Putnam, [facility] 02/23/2020, 2:38 PM

## 2020-02-23 NOTE — Telephone Encounter (Signed)
Sunday Spillers mother advised as per Dr. Raliegh Ip and aware of when to seek the urgent care.

## 2020-02-23 NOTE — Telephone Encounter (Signed)
Spoke to Loretta Savage threw up Sunday 04/25 and Monday 04/26, yesterday 04/27 she have not threw up whole but threw up at night in between 11:30 pm- 12 am and today on 04/28 she threw up in between 10 am- 11 am. She is able to hold liquid down but not solid food and she have been giving mountain dew and water. Zofran helped some but not improving her Sx's completely, she will give her Pepto-Bismol.

## 2020-02-24 ENCOUNTER — Other Ambulatory Visit: Payer: Self-pay

## 2020-02-24 DIAGNOSIS — R000 Tachycardia, unspecified: Secondary | ICD-10-CM | POA: Diagnosis not present

## 2020-02-24 DIAGNOSIS — Z79899 Other long term (current) drug therapy: Secondary | ICD-10-CM | POA: Diagnosis not present

## 2020-02-24 DIAGNOSIS — R112 Nausea with vomiting, unspecified: Secondary | ICD-10-CM | POA: Insufficient documentation

## 2020-02-24 DIAGNOSIS — R1110 Vomiting, unspecified: Secondary | ICD-10-CM | POA: Diagnosis present

## 2020-02-24 LAB — CBC
HCT: 45.6 % (ref 36.0–46.0)
Hemoglobin: 14.7 g/dL (ref 12.0–15.0)
MCH: 30 pg (ref 26.0–34.0)
MCHC: 32.2 g/dL (ref 30.0–36.0)
MCV: 93.1 fL (ref 80.0–100.0)
Platelets: 469 10*3/uL — ABNORMAL HIGH (ref 150–400)
RBC: 4.9 MIL/uL (ref 3.87–5.11)
RDW: 14.1 % (ref 11.5–15.5)
WBC: 11.7 10*3/uL — ABNORMAL HIGH (ref 4.0–10.5)
nRBC: 0 % (ref 0.0–0.2)

## 2020-02-24 LAB — COMPREHENSIVE METABOLIC PANEL
ALT: 24 U/L (ref 0–44)
AST: 41 U/L (ref 15–41)
Albumin: 4.3 g/dL (ref 3.5–5.0)
Alkaline Phosphatase: 71 U/L (ref 38–126)
Anion gap: 26 — ABNORMAL HIGH (ref 5–15)
BUN: 33 mg/dL — ABNORMAL HIGH (ref 6–20)
CO2: 16 mmol/L — ABNORMAL LOW (ref 22–32)
Calcium: 9.7 mg/dL (ref 8.9–10.3)
Chloride: 96 mmol/L — ABNORMAL LOW (ref 98–111)
Creatinine, Ser: 0.79 mg/dL (ref 0.44–1.00)
GFR calc Af Amer: >60 mL/min (ref >60)
GFR calc non Af Amer: >60 mL/min (ref >60)
Glucose, Bld: 92 mg/dL (ref 70–99)
Potassium: 4.4 mmol/L (ref 3.5–5.1)
Sodium: 138 mmol/L (ref 135–145)
Total Bilirubin: 2.3 mg/dL — ABNORMAL HIGH (ref 0.3–1.2)
Total Protein: 8.1 g/dL (ref 6.5–8.1)

## 2020-02-24 LAB — LIPASE, BLOOD: Lipase: 22 U/L (ref 11–51)

## 2020-02-24 MED ORDER — LACTATED RINGERS IV BOLUS
1000.0000 mL | Freq: Once | INTRAVENOUS | Status: AC
  Administered 2020-02-24: 22:00:00 1000 mL via INTRAVENOUS

## 2020-02-24 MED ORDER — SODIUM CHLORIDE 0.9% FLUSH
3.0000 mL | Freq: Once | INTRAVENOUS | Status: AC
  Administered 2020-02-25: 01:00:00 3 mL via INTRAVENOUS

## 2020-02-24 MED ORDER — ONDANSETRON 4 MG PO TBDP
4.0000 mg | ORAL_TABLET | Freq: Once | ORAL | Status: AC | PRN
  Administered 2020-02-25: 01:00:00 4 mg via ORAL
  Filled 2020-02-24: qty 1

## 2020-02-24 NOTE — ED Triage Notes (Signed)
PT to ED with mother who is legal guardian. States pt has been throwing up x2 days, last emesis at 8p today. PT with decreased appetite. DX with covid a little over 2 weeks ago. VSS.  PT caregiver also c/o rash on neck where pt's vomit touched her skin. Vomit is dark per caregiver, but no coffee grounds. Color appears normal.

## 2020-02-24 NOTE — ED Notes (Signed)
1 set of cultures sent to lab with purple and green top. PT given blankets and placed in subwait for IVF therapy. Legal guardian with pt.

## 2020-02-25 ENCOUNTER — Emergency Department
Admission: EM | Admit: 2020-02-25 | Discharge: 2020-02-25 | Disposition: A | Discharge status: Home / Self Care | Payer: Medicare Other | Attending: Emergency Medicine | Admitting: Emergency Medicine

## 2020-02-25 ENCOUNTER — Emergency Department: Payer: Medicare Other

## 2020-02-25 DIAGNOSIS — R112 Nausea with vomiting, unspecified: Secondary | ICD-10-CM

## 2020-02-25 DIAGNOSIS — R000 Tachycardia, unspecified: Secondary | ICD-10-CM | POA: Diagnosis not present

## 2020-02-25 DIAGNOSIS — R1110 Vomiting, unspecified: Secondary | ICD-10-CM | POA: Diagnosis not present

## 2020-02-25 HISTORY — DX: Cerebral palsy, unspecified: G80.9

## 2020-02-25 LAB — URINALYSIS, COMPLETE (UACMP) WITH MICROSCOPIC
Bacteria, UA: NONE SEEN
Bilirubin Urine: NEGATIVE
Glucose, UA: NEGATIVE mg/dL
Hgb urine dipstick: NEGATIVE
Ketones, ur: 80 mg/dL — AB
Leukocytes,Ua: NEGATIVE
Nitrite: NEGATIVE
Protein, ur: NEGATIVE mg/dL
Specific Gravity, Urine: 1.019 (ref 1.005–1.030)
pH: 5 (ref 5.0–8.0)

## 2020-02-25 LAB — PREGNANCY, URINE: Preg Test, Ur: NEGATIVE

## 2020-02-25 MED ORDER — SODIUM CHLORIDE 0.9 % IV BOLUS
1000.0000 mL | Freq: Once | INTRAVENOUS | Status: AC
  Administered 2020-02-25: 06:00:00 1000 mL via INTRAVENOUS

## 2020-02-25 MED ORDER — PANTOPRAZOLE SODIUM 20 MG PO TBEC: 20.0000 mg | DELAYED_RELEASE_TABLET | Freq: Two times a day (BID) | ORAL | 0 refills | Status: DC

## 2020-02-25 MED ORDER — SODIUM CHLORIDE 0.9 % IV BOLUS
1000.0000 mL | Freq: Once | INTRAVENOUS | Status: AC
  Administered 2020-02-25: 01:00:00 1000 mL via INTRAVENOUS

## 2020-02-25 MED ORDER — ONDANSETRON 4 MG PO TBDP: 4.0000 mg | ORAL_TABLET | Freq: Three times a day (TID) | ORAL | 0 refills | Status: DC | PRN

## 2020-02-25 MED ORDER — IOHEXOL 300 MG/ML  SOLN
100.0000 mL | Freq: Once | INTRAMUSCULAR | Status: AC | PRN
  Administered 2020-02-25: 04:00:00 75 mL via INTRAVENOUS

## 2020-02-25 NOTE — ED Notes (Signed)
Pt has been able to keep down fluids. Provider notified. Mother of pt is calling for ride.

## 2020-02-25 NOTE — ED Notes (Signed)
Provider notified of elevated BP

## 2020-02-25 NOTE — ED Notes (Signed)
Provider notified of heart rate. Provider ordered IVF. IVF started. Please see MAR

## 2020-02-25 NOTE — ED Provider Notes (Signed)
[facility] Emergency Department Provider Note  ____________________________________________   First MD Initiated Contact with Patient 02/25/20 0115     (approximate)  I have reviewed the triage vital signs and the nursing notes.  History obtained from the patient's mother as the patient is nonverbal HISTORY  Chief Complaint Emesis and Rash   HPI Loretta Savage is a 54 y.o. female with below list of previous medical conditions including cerebral palsy chronic kidney disease and recent COVID-19 infection February 04, 2020 presents to the emergency department secondary to continuous vomiting since 418 2021 which patient's mother states improved but worsened again 2 days ago.  Patient's mother denies any diarrhea.    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 Authorizing Provider  acetaminophen (TYLENOL) 500 MG tablet Take by mouth.    [provider]  ALPRAZolam (XANAX) 0.5 MG tablet TAKE 1 TABLET (0.5 MG TOTAL)  BY MOUTH 2 (TWO) TIMES DAILY. FOR ANXIETY Patient not taking: Reported on 02/11/2020 08/25/19   Olin Hauser, DO  aspirin 81 MG EC tablet Take by mouth.    [provider]  bisacodyl (MAGIC BULLETS) 10 MG suppository Place rectally.    [provider]  Calcium-Vitamin D 600-200 MG-UNIT tablet Take by mouth.    [provider]  Cholecalciferol (D2000 ULTRA STRENGTH) 50 MCG (2000 UT) CAPS Take by mouth.    [provider]  Dexlansoprazole (DEXILANT) 30 MG capsule 30 mg daily. 11/27/15   [provider]  docusate sodium (COLACE CLEAR) 50 MG capsule Take by mouth.    [provider]  doxazosin (CARDURA) 2 MG tablet TAKE 1 TABLET (2 MG TOTAL) BY MOUTH AT BEDTIME. FOR BLADDER. 03/22/19   Olin Hauser, DO  fluticasone [facility]) 50 MCG/ACT nasal spray SPRAY 1 SPRAY INTO EACH NOSTRIL TWICE A DAY 02/15/20   Karamalegos, Devonne Doughty, DO  levocetirizine (XYZAL) 5 MG tablet Take 5 mg by mouth daily. 11/08/17   [provider]  lidocaine-prilocaine (EMLA) cream  09/07/18   [provider]  loratadine (CLARITIN) 10 MG tablet Take by mouth. 06/19/12   [provider]  meloxicam (MOBIC) 15 MG tablet TAKE 1 TABLET BY MOUTH DAILY FOR ARTHRITIS 02/10/20   Parks Ranger, Devonne Doughty, DO  methenamine (HIPREX) 1 g tablet TAKE 1 TABLET (1 G TOTAL) BY MOUTH 2 (TWO) TIMES DAILY WITH A MEAL. 08/05/19   Karamalegos, Devonne Doughty, DO  ondansetron (ZOFRAN ODT) 4 MG disintegrating tablet Take 1-2 tablets (4-8 mg total) by mouth  every 8 (eight) hours as needed for nausea or vomiting. 02/23/20   Parks Ranger, Devonne Doughty, DO  promethazine (PHENERGAN) 12.5 MG suppository Place 1 suppository (12.5 mg total) rectally every 8 (eight) hours as needed for nausea or vomiting. 02/23/20   Karamalegos, Devonne Doughty, DO  sucralfate (CARAFATE) 1 g tablet TAKE 1 TABLET BY MOUTH AT 10:30AM AND 1 TABLET AT BEDTIME EVERY DAY 01/05/18   [provider]   traZODone (DESYREL) 50 MG tablet TAKE 1 TABLET (50 MG TOTAL) BY MOUTH AT BEDTIME. FOR SLEEP 11/16/19   Olin Hauser, DO    Allergies Neomycin-bacitracin zn-polymyx, Hydrocortisone, Levofloxacin, and Penicillins  Family History  Problem Relation Age of Onset   Colon polyps Mother    Heart disease Father    Heart attack Father    Thyroid disease Maternal Grandmother    Heart disease Maternal Grandfather    Heart disease Paternal Grandmother    Heart disease Paternal Grandfather     Social History Social History   Tobacco Use   Smoking status: Never Smoker   Smokeless tobacco: Never Used  Substance Use Topics   Alcohol use: Never   Drug use: Never    Review of Systems Constitutional: No fever/chills Eyes: No visual changes. ENT: No sore throat. Cardiovascular: Denies chest pain. Respiratory: Denies shortness of breath. Gastrointestinal: No abdominal pain.  No nausea, positive for vomiting.  No diarrhea.  No constipation. Genitourinary: Negative for dysuria. Musculoskeletal: Negative for neck pain.  Negative for back pain. Integumentary: Negative for rash. Neurological: Negative for headaches, focal weakness or numbness.   ____________________________________________   PHYSICAL EXAM:  VITAL SIGNS: ED Triage Vitals  Enc Vitals Group     BP 02/24/20 2124 102/84     Pulse --      Resp 02/24/20 2124 18     Temp 02/24/20 2124 98.2 F (36.8 C)     Temp Source 02/24/20 2124 Oral     SpO2 02/24/20 2124 97 %     Weight 02/24/20 2125 52.2 kg (115 lb)     Height 02/24/20 2125 1.346 m (4\' 5" )     Head Circumference --      Peak Flow --      Pain Score --      Pain Loc --      Pain Edu? --      Excl. in [place]? --     Constitutional: Alert and oriented.  Eyes: Conjunctivae are normal. . Mouth/Throat: Patient is wearing a mask. Neck: No stridor.  No meningeal signs.   Cardiovascular: Normal rate, regular rhythm. Good peripheral circulation.  Grossly normal heart sounds. Respiratory: Normal respiratory effort.  No retractions. Gastrointestinal: Soft and nontender. No distention.  Musculoskeletal: No lower extremity tenderness nor edema. No gross deformities of extremities. Neurologic:  Normal speech and language. No gross focal neurologic deficits are appreciated.  Skin:  Skin is warm, dry and intact. Psychiatric: Mood and affect are normal. Speech and behavior are normal.  ____________________________________________   LABS (all labs ordered are listed, but only abnormal results are displayed)  Labs Reviewed  COMPREHENSIVE METABOLIC PANEL - Abnormal; Notable for the following components:      Result Value   Chloride 96 (*)    CO2 16 (*)    BUN 33 (*)    Total Bilirubin 2.3 (*)    Anion gap 26 (*)    All other components within normal limits  CBC - Abnormal; Notable for the following components:   WBC 11.7 (*)  Platelets 469 (*)    All other components within normal limits  URINALYSIS, COMPLETE (UACMP) WITH MICROSCOPIC - Abnormal; Notable for the following components:   Color, Urine YELLOW (*)    APPearance CLEAR (*)    Ketones, ur 80 (*)    All other components within normal limits  LIPASE, BLOOD  PREGNANCY, URINE   ____________________________________________  EKG  ED ECG REPORT I, Prosser N Gjon Letarte, the attending physician, personally viewed and interpreted this ECG.   Date: 02/24/2020  EKG Time: 9:34 PM  Rate: 137  Rhythm: Sinus tachycardia  Axis: Normal  Intervals: Normal  ST&T Change: None  ____________________________________________  RADIOLOGY I, Meridian N Chrishana Spargur, personally viewed and evaluated these images (plain radiographs) as part of my medical decision making, as well as reviewing the written report by the radiologist.  ED MD interpretation: No acute findings noted on CT abdomen pelvis per radiologist.  Official radiology report(s): CT ABDOMEN PELVIS W CONTRAST  Result Date:  02/25/2020 CLINICAL DATA:  Nausea and vomiting with acute abdominal pain EXAM: CT ABDOMEN AND PELVIS WITH CONTRAST TECHNIQUE: Multidetector CT imaging of the abdomen and pelvis was performed using the standard protocol following bolus administration of intravenous contrast. CONTRAST:  85mL OMNIPAQUE IOHEXOL 300 MG/ML  SOLN COMPARISON:  08/03/2014 FINDINGS: Lower chest:  Moderate sliding hiatal hernia Hepatobiliary: Small cystic density in the central liver.No evidence of biliary obstruction or stone. Pancreas: Unremarkable. Spleen: Unremarkable. Adrenals/Urinary Tract: Negative adrenals. No hydronephrosis or stone. Unremarkable bladder. Stomach/Bowel: No obstruction. No appendicitis. Moderate desiccated stool, but nonobstructive. Vascular/Lymphatic: No acute vascular abnormality. No mass or adenopathy. Reproductive:No pathologic findings. Other: No ascites or pneumoperitoneum. Musculoskeletal: No acute abnormalities. Scoliosis and hip dysplasia. Marked muscular atrophy. No acute osseous finding. IMPRESSION: 1. No acute finding. Negative for bowel obstruction or visible inflammation. 2. Hiatal hernia. Electronically Signed   By: Monte Fantasia M.D.   On: 02/25/2020 04:14     INITIAL IMPRESSION / MDM / ASSESSMENT AND PLAN / ED COURSE  As part of my medical decision making, I reviewed the following data within the electronic MEDICAL RECORD NUMBER  54 year old female presented with above-stated history and physical exam a differential diagnosis including but not limited to infectious gastroenteritis, appendicitis diverticulitis colitis pancreatitis.  Laboratory data revealed white blood cell count of 11.7 bilirubin 2.3.  UA revealed 80.  Patient given IV Zofran in the emergency department with no further vomiting noted.  Patient also given 2 L IV normal saline.  CT abdomen pelvis revealed no acute intra-abdominal pathology.   FINAL CLINICAL IMPRESSION(S) / ED DIAGNOSES  Final diagnoses:  Non-intractable  vomiting with nausea, unspecified vomiting type     MEDICATIONS GIVEN DURING THIS VISIT:  Medications  sodium chloride flush (NS) 0.9 % injection 3 mL (3 mLs Intravenous Given 02/25/20 0127)  ondansetron (ZOFRAN-ODT) disintegrating tablet 4 mg (4 mg Oral Given 02/25/20 0127)  lactated ringers bolus 1,000 mL (0 mLs Intravenous Stopped 02/25/20 0120)  sodium chloride 0.9 % bolus 1,000 mL (0 mLs Intravenous Stopped 02/25/20 0228)  iohexol (OMNIPAQUE) 300 MG/ML solution 100 mL (75 mLs Intravenous Contrast Given 02/25/20 0343)     ED Discharge Orders    None      *Please note:  Loretta Savage was evaluated in Emergency Department on 02/25/2020 for the symptoms described in the history of present illness. She was evaluated in the context of the global COVID-19 pandemic, which necessitated consideration that the patient might be at risk for infection with the SARS-CoV-2 virus that causes  COVID-19. Institutional protocols and algorithms that pertain to the evaluation of patients at risk for COVID-19 are in a state of rapid change based on information released by regulatory bodies including the CDC and federal and state organizations. These policies and algorithms were followed during the patient's care in the ED.  Some ED evaluations and interventions may be delayed as a result of limited staffing during the pandemic.*  Note:  This document was prepared using Dragon voice recognition software and may include unintentional dictation errors.   Gregor Hams, MD 02/25/20 [phone]

## 2020-02-25 NOTE — ED Notes (Signed)
Hand off of care and report given to [place], [place]

## 2020-02-25 NOTE — ED Notes (Signed)
Pt had wet depends. This RN and another RN cleaned pt and placed new depends.

## 2020-02-25 NOTE — ED Notes (Signed)
Pt given ensure to drink. Mother was helping. Pt started to cough with sips. Mother states this has been normal and the cough is better. Provider notified.

## 2020-02-25 NOTE — ED Notes (Signed)
Mother states pt had covid-19 on 02/04/2020. Pt started to have nausea and vomiting on 02/13/2020 and was here. Mother states pt got some relief for a few days, but then the vomiting restarted. Pt noted to have a red rash to the neck and the left hand and the left arm is in a plastic immobilizer/cast.

## 2020-02-25 NOTE — ED Notes (Signed)
Pt back from CT

## 2020-02-29 DIAGNOSIS — R1110 Vomiting, unspecified: Secondary | ICD-10-CM | POA: Diagnosis not present

## 2020-03-02 ENCOUNTER — Encounter: Payer: Self-pay | Admitting: Emergency Medicine

## 2020-03-02 ENCOUNTER — Other Ambulatory Visit: Payer: Self-pay

## 2020-03-02 ENCOUNTER — Emergency Department: Payer: Medicare Other

## 2020-03-02 ENCOUNTER — Emergency Department
Admission: EM | Admit: 2020-03-02 | Discharge: 2020-03-02 | Disposition: A | Discharge status: Home / Self Care | Payer: Medicare Other | Attending: Emergency Medicine | Admitting: Emergency Medicine

## 2020-03-02 DIAGNOSIS — Z87442 Personal history of urinary calculi: Secondary | ICD-10-CM | POA: Diagnosis not present

## 2020-03-02 DIAGNOSIS — R112 Nausea with vomiting, unspecified: Secondary | ICD-10-CM

## 2020-03-02 DIAGNOSIS — G809 Cerebral palsy, unspecified: Secondary | ICD-10-CM | POA: Diagnosis not present

## 2020-03-02 DIAGNOSIS — R140 Abdominal distension (gaseous): Secondary | ICD-10-CM | POA: Insufficient documentation

## 2020-03-02 DIAGNOSIS — R10817 Generalized abdominal tenderness: Secondary | ICD-10-CM | POA: Diagnosis not present

## 2020-03-02 DIAGNOSIS — R1110 Vomiting, unspecified: Secondary | ICD-10-CM | POA: Diagnosis not present

## 2020-03-02 DIAGNOSIS — D649 Anemia, unspecified: Secondary | ICD-10-CM | POA: Diagnosis not present

## 2020-03-02 DIAGNOSIS — K219 Gastro-esophageal reflux disease without esophagitis: Secondary | ICD-10-CM | POA: Insufficient documentation

## 2020-03-02 DIAGNOSIS — R05 Cough: Secondary | ICD-10-CM | POA: Diagnosis not present

## 2020-03-02 DIAGNOSIS — R000 Tachycardia, unspecified: Secondary | ICD-10-CM | POA: Diagnosis not present

## 2020-03-02 DIAGNOSIS — K920 Hematemesis: Secondary | ICD-10-CM | POA: Diagnosis not present

## 2020-03-02 LAB — COMPREHENSIVE METABOLIC PANEL
ALT: 27 U/L (ref 0–44)
AST: 34 U/L (ref 15–41)
Albumin: 3.4 g/dL — ABNORMAL LOW (ref 3.5–5.0)
Alkaline Phosphatase: 49 U/L (ref 38–126)
Anion gap: 11 (ref 5–15)
BUN: 10 mg/dL (ref 6–20)
CO2: 25 mmol/L (ref 22–32)
Calcium: 8.6 mg/dL — ABNORMAL LOW (ref 8.9–10.3)
Chloride: 97 mmol/L — ABNORMAL LOW (ref 98–111)
Creatinine, Ser: 0.36 mg/dL — ABNORMAL LOW (ref 0.44–1.00)
GFR calc Af Amer: >60 mL/min (ref >60)
GFR calc non Af Amer: >60 mL/min (ref >60)
Glucose, Bld: 97 mg/dL (ref 70–99)
Potassium: 3.8 mmol/L (ref 3.5–5.1)
Sodium: 133 mmol/L — ABNORMAL LOW (ref 135–145)
Total Bilirubin: 0.9 mg/dL (ref 0.3–1.2)
Total Protein: 6.1 g/dL — ABNORMAL LOW (ref 6.5–8.1)

## 2020-03-02 LAB — CBC
HCT: 34.9 % — ABNORMAL LOW (ref 36.0–46.0)
Hemoglobin: 11.3 g/dL — ABNORMAL LOW (ref 12.0–15.0)
MCH: 30.1 pg (ref 26.0–34.0)
MCHC: 32.4 g/dL (ref 30.0–36.0)
MCV: 92.8 fL (ref 80.0–100.0)
Platelets: 206 10*3/uL (ref 150–400)
RBC: 3.76 MIL/uL — ABNORMAL LOW (ref 3.87–5.11)
RDW: 14.2 % (ref 11.5–15.5)
WBC: 6.6 10*3/uL (ref 4.0–10.5)
nRBC: 0 % (ref 0.0–0.2)

## 2020-03-02 LAB — LIPASE, BLOOD: Lipase: 21 U/L (ref 11–51)

## 2020-03-02 MED ORDER — IOHEXOL 300 MG/ML  SOLN
75.0000 mL | Freq: Once | INTRAMUSCULAR | Status: AC | PRN
  Administered 2020-03-02: 75 mL via INTRAVENOUS

## 2020-03-02 MED ORDER — SODIUM CHLORIDE 0.9 % IV BOLUS
500.0000 mL | Freq: Once | INTRAVENOUS | Status: AC
  Administered 2020-03-02: 500 mL via INTRAVENOUS

## 2020-03-02 MED ORDER — LORAZEPAM 2 MG/ML IJ SOLN
0.5000 mg | Freq: Once | INTRAMUSCULAR | Status: AC
  Administered 2020-03-02: 0.5 mg via INTRAVENOUS
  Filled 2020-03-02: qty 1

## 2020-03-02 MED ORDER — ONDANSETRON HCL 4 MG/2ML IJ SOLN
4.0000 mg | Freq: Once | INTRAMUSCULAR | Status: AC | PRN
  Administered 2020-03-02: 4 mg via INTRAVENOUS
  Filled 2020-03-02: qty 2

## 2020-03-02 MED ORDER — ONDANSETRON HCL 4 MG/2ML IJ SOLN
4.0000 mg | Freq: Once | INTRAMUSCULAR | Status: AC
  Administered 2020-03-02: 4 mg via INTRAVENOUS
  Filled 2020-03-02: qty 2

## 2020-03-02 NOTE — ED Triage Notes (Signed)
Patient to ER with c/o N/V. Patient is nonverbal at baseline. Was seen on 4/29 for the same, has had ongoing episodes of vomiting. Mother is with patient and states patient vomited x4 at MD's office today. Also states vomiting occurs more after coughing.

## 2020-03-02 NOTE — ED Notes (Signed)
Pt to CT

## 2020-03-02 NOTE — ED Notes (Signed)
Mother advised not to give pt anything to eat or drink at this time.

## 2020-03-02 NOTE — ED Notes (Signed)
Pt given a phone to call family member as a ride.

## 2020-03-02 NOTE — ED Notes (Signed)
Mother has given pt ensure after this RN walked out of room.

## 2020-03-02 NOTE — ED Notes (Addendum)
This Rn called to RN by mother who st pt vomited.  Mother gave pt pedialyte. This RN advised mother to keep pt NPO at this time.  Emesis noted to be yellow in color. NAD noted at this time.  Pt cleaned and dry at this time.  EP Williams made aware.

## 2020-03-02 NOTE — ED Provider Notes (Addendum)
[facility] Emergency Department Provider Note       Time seen: ----------------------------------------- 5:13 PM on 03/02/2020 -----------------------------------------   I have reviewed the triage vital signs and the nursing notes. Level V caveat: History/ROS limited by nonverbal state HISTORY   Chief Complaint Nausea and Emesis   HPI Loretta Savage is a 54 y.o. female with a history of anxiety, cerebral palsy, chronic kidney disease, GERD who presents to the ED for nausea vomiting.  Patient nonverbal at baseline, seen recently for same.  Mom states patient vomited 4 times in the doctor's office today, vomiting occurs more with coughing.  Past Medical History:  Diagnosis Date  . Anxiety   . Cerebral palsy ([place])   . Chronic kidney disease    stone  . Colon polyp   . GERD (gastroesophageal reflux disease)   . Urinary incontinence     Patient Active Problem List   Diagnosis Date Noted  . Scoliosis 11/23/2019  . Bowel and bladder incontinence 11/23/2019  . 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 Anxiety 02/04/2018  . Insomnia 02/04/2018  . 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 GERD (gastroesophageal reflux disease) 02/04/2018  . 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 Seasonal allergies 02/04/2018  . Chronic interstitial cystitis 02/04/2018    Past Surgical History:  Procedure Laterality Date  . ABDOMINAL HYSTERECTOMY  2006  . BACK SURGERY  03/1991  . HIP ADDUCTOR TENOTOMY  2004  . REPAIR QUADRICEPS / HAMSTRING MUSCLE  1992    Allergies Neomycin-bacitracin zn-polymyx, Hydrocortisone, Levofloxacin, and Penicillins  Social History Social History   Tobacco Use  . Smoking status: Never Smoker  . Smokeless tobacco: Never Used  Substance Use Topics  . Alcohol use: Never  . Drug use: Never    Review of Systems  Gastrointestinal:  Positive for vomiting Review of systems otherwise unknown All systems negative/normal/unremarkable except as stated in the HPI  ____________________________________________   PHYSICAL EXAM:  VITAL SIGNS: ED Triage Vitals  Enc Vitals Group     BP 03/02/20 1350 123/75     Pulse Rate 03/02/20 1350 (!) 113     Resp 03/02/20 1350 20     Temp 03/02/20 1350 (!) 97.5 F (36.4 C)     Temp Source 03/02/20 1350 Axillary     SpO2 03/02/20 1350 93 %     Weight 03/02/20 1357 115 lb 1.3 oz (52.2 kg)     Height 03/02/20 1357 4\' 5"  (1.346 m)     Head Circumference --      Peak Flow --      Pain Score --      Pain Loc --      Pain Edu? --      Excl. in [place]? --     Constitutional: Alert, apparent distress Eyes: Conjunctivae are normal. Normal extraocular movements. Cardiovascular: Normal rate, regular rhythm. No murmurs, rubs, or gallops. Respiratory: Normal respiratory effort without tachypnea nor retractions. Breath sounds are clear and equal bilaterally. No wheezes/rales/rhonchi. Gastrointestinal: Distended, normal bowel sounds. Musculoskeletal: Nontender with normal range of motion in extremities. No lower extremity tenderness nor edema. Neurologic:   No gross focal neurologic deficits are appreciated according to mother Skin:  Skin is warm, dry and intact. No rash noted. Psychiatric: Mood and affect are normal.  ____________________________________________  EKG: Interpreted by me.  Sinus tachycardia the rate of 112 bpm, low voltage QRS, normal axis, normal QT  ____________________________________________  ED COURSE:  As part of my medical decision making, I reviewed the following data within the [place] History obtained from family if available, nursing notes, old chart and ekg, as well as notes from prior ED visits. Patient presented for nausea and vomiting, we will assess with labs and imaging as indicated at this time.   Procedures  Loretta Savage was  evaluated in Emergency Department on 03/02/2020 for the symptoms described in the history of present illness. She was evaluated in the context of the global COVID-19 pandemic, which necessitated consideration that the patient might be at risk for infection with the SARS-CoV-2 virus that causes COVID-19. Institutional protocols and algorithms that pertain to the evaluation of patients at risk for COVID-19 are in a state of rapid change based on information released by regulatory bodies including the CDC and federal and state organizations. These policies and algorithms were followed during the patient's care in the ED.  ____________________________________________   LABS (pertinent positives/negatives)  Labs Reviewed  COMPREHENSIVE METABOLIC PANEL - Abnormal; Notable for the following components:      Result Value   Sodium 133 (*)    Chloride 97 (*)    Creatinine, Ser 0.36 (*)    Calcium 8.6 (*)    Total Protein 6.1 (*)    Albumin 3.4 (*)    All other components within normal limits  CBC - Abnormal; Notable for the following components:   RBC 3.76 (*)    Hemoglobin 11.3 (*)    HCT 34.9 (*)    All other components within normal limits  LIPASE, BLOOD  URINALYSIS, COMPLETE (UACMP) WITH MICROSCOPIC    RADIOLOGY Images were viewed by me  Chest x-ray IMPRESSION: 1. No acute intrathoracic process. CT the abdomen pelvis IMPRESSION: 1. No CT evidence for acute intra-abdominal or pelvic abnormality. 2. Moderate hiatal hernia. ____________________________________________   DIFFERENTIAL DIAGNOSIS   Gastroenteritis, dehydration, electrolyte abnormality, obstruction, constipation, anxiety  FINAL ASSESSMENT AND PLAN  Vomiting   Plan: The patient had presented for vomiting. Patient's labs were overall reassuring. Patient's imaging did not reveal any acute process.  She was given additional antiemetics and IV fluids.  She was able to tolerate some liquids while here.  She has outpatient  endoscopy scheduled.  She appears cleared for outpatient follow-up.   Laurence Aly, MD    Note: This note was generated in part or whole with voice recognition software. Voice recognition is usually quite accurate but there are transcription errors that can and very often do occur. I apologize for any typographical errors that were not detected and corrected.     Earleen Newport, MD 03/02/20 1850    Earleen Newport, MD 03/02/20 [phone]

## 2020-03-02 NOTE — ED Notes (Signed)
Mother at bedside. Mother st pt started N/V this am. Unable to keep anything down.   Pt moaning upon assessment. EDP WIlliams at bedside.

## 2020-03-06 ENCOUNTER — Emergency Department: Payer: Medicare Other

## 2020-03-06 ENCOUNTER — Inpatient Hospital Stay
Admission: EM | Admit: 2020-03-06 | Discharge: 2020-03-23 | DRG: 368 | Disposition: A | Discharge status: Hospice | Payer: Medicare Other | Attending: Internal Medicine | Admitting: Internal Medicine

## 2020-03-06 ENCOUNTER — Other Ambulatory Visit
Admission: RE | Admit: 2020-03-06 | Discharge: 2020-03-06 | Disposition: A | Discharge status: Home / Self Care | Payer: Medicare Other | Source: Ambulatory Visit | Attending: Internal Medicine | Admitting: Internal Medicine

## 2020-03-06 ENCOUNTER — Inpatient Hospital Stay: Admit: 2020-03-06 | Payer: Medicare Other

## 2020-03-06 ENCOUNTER — Other Ambulatory Visit: Payer: Self-pay

## 2020-03-06 ENCOUNTER — Encounter: Payer: Self-pay | Admitting: Emergency Medicine

## 2020-03-06 DIAGNOSIS — Z8249 Family history of ischemic heart disease and other diseases of the circulatory system: Secondary | ICD-10-CM

## 2020-03-06 DIAGNOSIS — K219 Gastro-esophageal reflux disease without esophagitis: Secondary | ICD-10-CM

## 2020-03-06 DIAGNOSIS — K2101 Gastro-esophageal reflux disease with esophagitis, with bleeding: Secondary | ICD-10-CM | POA: Diagnosis not present

## 2020-03-06 DIAGNOSIS — Z8371 Family history of colonic polyps: Secondary | ICD-10-CM

## 2020-03-06 DIAGNOSIS — Z7982 Long term (current) use of aspirin: Secondary | ICD-10-CM

## 2020-03-06 DIAGNOSIS — R509 Fever, unspecified: Secondary | ICD-10-CM

## 2020-03-06 DIAGNOSIS — R000 Tachycardia, unspecified: Secondary | ICD-10-CM | POA: Diagnosis not present

## 2020-03-06 DIAGNOSIS — R1110 Vomiting, unspecified: Secondary | ICD-10-CM | POA: Diagnosis not present

## 2020-03-06 DIAGNOSIS — Z8616 Personal history of COVID-19: Secondary | ICD-10-CM | POA: Diagnosis not present

## 2020-03-06 DIAGNOSIS — R059 Cough, unspecified: Secondary | ICD-10-CM

## 2020-03-06 DIAGNOSIS — R569 Unspecified convulsions: Secondary | ICD-10-CM | POA: Diagnosis present

## 2020-03-06 DIAGNOSIS — A419 Sepsis, unspecified organism: Secondary | ICD-10-CM

## 2020-03-06 DIAGNOSIS — B3781 Candidal esophagitis: Secondary | ICD-10-CM | POA: Diagnosis not present

## 2020-03-06 DIAGNOSIS — M1990 Unspecified osteoarthritis, unspecified site: Secondary | ICD-10-CM | POA: Diagnosis present

## 2020-03-06 DIAGNOSIS — R600 Localized edema: Secondary | ICD-10-CM | POA: Diagnosis present

## 2020-03-06 DIAGNOSIS — R1310 Dysphagia, unspecified: Secondary | ICD-10-CM | POA: Diagnosis present

## 2020-03-06 DIAGNOSIS — R339 Retention of urine, unspecified: Secondary | ICD-10-CM | POA: Diagnosis present

## 2020-03-06 DIAGNOSIS — E876 Hypokalemia: Secondary | ICD-10-CM | POA: Diagnosis not present

## 2020-03-06 DIAGNOSIS — E871 Hypo-osmolality and hyponatremia: Secondary | ICD-10-CM | POA: Diagnosis present

## 2020-03-06 DIAGNOSIS — Z79899 Other long term (current) drug therapy: Secondary | ICD-10-CM

## 2020-03-06 DIAGNOSIS — D500 Iron deficiency anemia secondary to blood loss (chronic): Secondary | ICD-10-CM

## 2020-03-06 DIAGNOSIS — K5909 Other constipation: Secondary | ICD-10-CM | POA: Diagnosis not present

## 2020-03-06 DIAGNOSIS — Z888 Allergy status to other drugs, medicaments and biological substances status: Secondary | ICD-10-CM

## 2020-03-06 DIAGNOSIS — R112 Nausea with vomiting, unspecified: Secondary | ICD-10-CM | POA: Diagnosis not present

## 2020-03-06 DIAGNOSIS — G9340 Encephalopathy, unspecified: Secondary | ICD-10-CM | POA: Diagnosis not present

## 2020-03-06 DIAGNOSIS — Z87440 Personal history of urinary (tract) infections: Secondary | ICD-10-CM

## 2020-03-06 DIAGNOSIS — I959 Hypotension, unspecified: Secondary | ICD-10-CM | POA: Diagnosis present

## 2020-03-06 DIAGNOSIS — R110 Nausea: Secondary | ICD-10-CM

## 2020-03-06 DIAGNOSIS — E860 Dehydration: Secondary | ICD-10-CM | POA: Diagnosis present

## 2020-03-06 DIAGNOSIS — E162 Hypoglycemia, unspecified: Secondary | ICD-10-CM | POA: Diagnosis present

## 2020-03-06 DIAGNOSIS — R194 Change in bowel habit: Secondary | ICD-10-CM

## 2020-03-06 DIAGNOSIS — G809 Cerebral palsy, unspecified: Secondary | ICD-10-CM

## 2020-03-06 DIAGNOSIS — N189 Chronic kidney disease, unspecified: Secondary | ICD-10-CM | POA: Diagnosis present

## 2020-03-06 DIAGNOSIS — Z791 Long term (current) use of non-steroidal anti-inflammatories (NSAID): Secondary | ICD-10-CM

## 2020-03-06 DIAGNOSIS — R609 Edema, unspecified: Secondary | ICD-10-CM

## 2020-03-06 DIAGNOSIS — Z66 Do not resuscitate: Secondary | ICD-10-CM | POA: Diagnosis not present

## 2020-03-06 DIAGNOSIS — Z515 Encounter for palliative care: Secondary | ICD-10-CM | POA: Diagnosis not present

## 2020-03-06 DIAGNOSIS — K209 Esophagitis, unspecified without bleeding: Secondary | ICD-10-CM | POA: Diagnosis present

## 2020-03-06 DIAGNOSIS — E869 Volume depletion, unspecified: Secondary | ICD-10-CM | POA: Diagnosis not present

## 2020-03-06 DIAGNOSIS — K449 Diaphragmatic hernia without obstruction or gangrene: Secondary | ICD-10-CM | POA: Diagnosis present

## 2020-03-06 DIAGNOSIS — Z880 Allergy status to penicillin: Secondary | ICD-10-CM

## 2020-03-06 DIAGNOSIS — F419 Anxiety disorder, unspecified: Secondary | ICD-10-CM | POA: Diagnosis present

## 2020-03-06 DIAGNOSIS — I471 Supraventricular tachycardia: Secondary | ICD-10-CM | POA: Diagnosis present

## 2020-03-06 DIAGNOSIS — Z7189 Other specified counseling: Secondary | ICD-10-CM

## 2020-03-06 LAB — COMPREHENSIVE METABOLIC PANEL
ALT: 19 U/L (ref 0–44)
AST: 23 U/L (ref 15–41)
Albumin: 4.3 g/dL (ref 3.5–5.0)
Alkaline Phosphatase: 68 U/L (ref 38–126)
Anion gap: 25 — ABNORMAL HIGH (ref 5–15)
BUN: 12 mg/dL (ref 6–20)
CO2: 12 mmol/L — ABNORMAL LOW (ref 22–32)
Calcium: 10.1 mg/dL (ref 8.9–10.3)
Chloride: 110 mmol/L (ref 98–111)
Creatinine, Ser: 0.9 mg/dL (ref 0.44–1.00)
GFR calc Af Amer: >60 mL/min (ref >60)
GFR calc non Af Amer: >60 mL/min (ref >60)
Glucose, Bld: 126 mg/dL — ABNORMAL HIGH (ref 70–99)
Potassium: 3.9 mmol/L (ref 3.5–5.1)
Sodium: 147 mmol/L — ABNORMAL HIGH (ref 135–145)
Total Bilirubin: 1.7 mg/dL — ABNORMAL HIGH (ref 0.3–1.2)
Total Protein: 7.8 g/dL (ref 6.5–8.1)

## 2020-03-06 LAB — CBC
HCT: 46.1 % — ABNORMAL HIGH (ref 36.0–46.0)
Hemoglobin: 14.6 g/dL (ref 12.0–15.0)
MCH: 29.3 pg (ref 26.0–34.0)
MCHC: 31.7 g/dL (ref 30.0–36.0)
MCV: 92.4 fL (ref 80.0–100.0)
Platelets: 408 10*3/uL — ABNORMAL HIGH (ref 150–400)
RBC: 4.99 MIL/uL (ref 3.87–5.11)
RDW: 14.5 % (ref 11.5–15.5)
WBC: 13.5 10*3/uL — ABNORMAL HIGH (ref 4.0–10.5)
nRBC: 0 % (ref 0.0–0.2)

## 2020-03-06 LAB — URINALYSIS, COMPLETE (UACMP) WITH MICROSCOPIC
Bilirubin Urine: NEGATIVE
Glucose, UA: NEGATIVE mg/dL
Hgb urine dipstick: NEGATIVE
Ketones, ur: 80 mg/dL — AB
Leukocytes,Ua: NEGATIVE
Nitrite: NEGATIVE
Protein, ur: 100 mg/dL — AB
Specific Gravity, Urine: 1.015 (ref 1.005–1.030)
Squamous Epithelial / HPF: NONE SEEN (ref 0–5)
pH: 6 (ref 5.0–8.0)

## 2020-03-06 LAB — LIPASE, BLOOD: Lipase: 22 U/L (ref 11–51)

## 2020-03-06 MED ORDER — SODIUM CHLORIDE 0.9% FLUSH
3.0000 mL | Freq: Once | INTRAVENOUS | Status: AC
  Administered 2020-03-06: 3 mL via INTRAVENOUS

## 2020-03-06 MED ORDER — TRAZODONE HCL 50 MG PO TABS: 25.0000 mg | ORAL_TABLET | Freq: Every evening | ORAL | Status: DC | PRN

## 2020-03-06 MED ORDER — PANTOPRAZOLE SODIUM 20 MG PO TBEC
20.0000 mg | DELAYED_RELEASE_TABLET | Freq: Two times a day (BID) | ORAL | Status: DC
  Filled 2020-03-06: qty 1

## 2020-03-06 MED ORDER — ONDANSETRON HCL 4 MG PO TABS: 4.0000 mg | ORAL_TABLET | Freq: Four times a day (QID) | ORAL | Status: DC | PRN

## 2020-03-06 MED ORDER — ONDANSETRON HCL 4 MG/2ML IJ SOLN
4.0000 mg | Freq: Once | INTRAMUSCULAR | Status: AC
  Administered 2020-03-06: 4 mg via INTRAVENOUS
  Filled 2020-03-06: qty 2

## 2020-03-06 MED ORDER — TRAZODONE HCL 50 MG PO TABS
50.0000 mg | ORAL_TABLET | Freq: Every day | ORAL | Status: DC
  Administered 2020-03-09 – 2020-03-13 (×5): 50 mg via ORAL
  Filled 2020-03-06 (×8): qty 1

## 2020-03-06 MED ORDER — ACETAMINOPHEN 325 MG PO TABS
650.0000 mg | ORAL_TABLET | Freq: Four times a day (QID) | ORAL | Status: DC | PRN
  Filled 2020-03-06 (×3): qty 2

## 2020-03-06 MED ORDER — ONDANSETRON HCL 4 MG/2ML IJ SOLN
4.0000 mg | Freq: Four times a day (QID) | INTRAMUSCULAR | Status: DC | PRN
  Administered 2020-03-08 – 2020-03-09 (×2): 4 mg via INTRAVENOUS
  Filled 2020-03-06 (×3): qty 2

## 2020-03-06 MED ORDER — ACETAMINOPHEN 650 MG RE SUPP
650.0000 mg | Freq: Four times a day (QID) | RECTAL | Status: DC | PRN
  Administered 2020-03-09 – 2020-03-22 (×5): 650 mg via RECTAL
  Filled 2020-03-06 (×5): qty 1

## 2020-03-06 MED ORDER — SODIUM CHLORIDE 0.9 % IV BOLUS
1000.0000 mL | Freq: Once | INTRAVENOUS | Status: AC
  Administered 2020-03-06: 1000 mL via INTRAVENOUS

## 2020-03-06 MED ORDER — SODIUM CHLORIDE 0.9 % IV BOLUS
1000.0000 mL | Freq: Once | INTRAVENOUS | Status: AC
  Administered 2020-03-06: 19:00:00 1000 mL via INTRAVENOUS

## 2020-03-06 MED ORDER — FLUTICASONE PROPIONATE 50 MCG/ACT NA SUSP
2.0000 | Freq: Every day | NASAL | Status: DC
  Administered 2020-03-07 – 2020-03-23 (×15): 2 via NASAL
  Filled 2020-03-06: qty 16

## 2020-03-06 MED ORDER — ENOXAPARIN SODIUM 40 MG/0.4ML ~~LOC~~ SOLN
40.0000 mg | SUBCUTANEOUS | Status: DC
  Administered 2020-03-07: 40 mg via SUBCUTANEOUS
  Filled 2020-03-06: qty 0.4

## 2020-03-06 MED ORDER — DOXAZOSIN MESYLATE 2 MG PO TABS
2.0000 mg | ORAL_TABLET | Freq: Every day | ORAL | Status: DC
  Administered 2020-03-10 – 2020-03-13 (×4): 2 mg via ORAL
  Filled 2020-03-06 (×10): qty 1

## 2020-03-06 MED ORDER — SODIUM CHLORIDE 0.9 % IV SOLN: INTRAVENOUS | Status: DC

## 2020-03-06 MED ORDER — METHENAMINE MANDELATE 1 G PO TABS
1.0000 g | ORAL_TABLET | Freq: Two times a day (BID) | ORAL | Status: DC
  Administered 2020-03-07 – 2020-03-14 (×10): 1 g via ORAL
  Filled 2020-03-06 (×18): qty 1

## 2020-03-06 MED ORDER — LORATADINE 10 MG PO TABS
10.0000 mg | ORAL_TABLET | Freq: Every day | ORAL | Status: DC
  Administered 2020-03-07 – 2020-03-13 (×4): 10 mg via ORAL
  Filled 2020-03-06 (×5): qty 1

## 2020-03-06 MED ORDER — SUCRALFATE 1 G PO TABS
1.0000 g | ORAL_TABLET | Freq: Three times a day (TID) | ORAL | Status: DC
  Administered 2020-03-07: 1 g via ORAL
  Filled 2020-03-06: qty 1

## 2020-03-06 MED ORDER — ONDANSETRON 4 MG PO TBDP
4.0000 mg | ORAL_TABLET | Freq: Three times a day (TID) | ORAL | Status: DC | PRN
  Filled 2020-03-06: qty 2

## 2020-03-06 NOTE — ED Notes (Signed)
Pt sitting in bed in NAD, pt is nonverbal per mother. Mother reports vomiting that started yesterday but reports frequent episodes of vomiting since she had covid 04/16. Pt's appetite is decreased per mother. Pt behavior baseline per mother.

## 2020-03-06 NOTE — ED Notes (Signed)
IV team at bedside 

## 2020-03-06 NOTE — ED Notes (Signed)
Pt cleaned up and brief changed.

## 2020-03-06 NOTE — ED Notes (Signed)
Pt drank ensure that mother provided her with. Mother reports pt vomited 5 mins after drinking ensure.

## 2020-03-06 NOTE — ED Notes (Signed)
Pt had 2 episodes of emesis brown in color

## 2020-03-06 NOTE — H&P (Signed)
at [place] NAME: Loretta Savage    MR#:  [id number]  DATE OF BIRTH:  04/04/1966  DATE OF ADMISSION:  03/06/2020  PRIMARY CARE PHYSICIAN: Olin Hauser, DO   REQUESTING/REFERRING PHYSICIAN: Nance Pear, MD CHIEF COMPLAINT:   Chief Complaint  Patient presents with  . Emesis    HISTORY OF PRESENT ILLNESS:  Loretta Savage  is a 54 y.o. Caucasian female with a known history of anxiety, GERD and cerebral palsy, who presented to the emergency room with acute onset of intractable nausea and vomiting which has been intermittent over the last few weeks since 4/18.  The patient was scheduled to have an EGD by Dr. Alice Reichert on Wednesday.  Her mother and caregiver believes that she may have had mild abdominal discomfort.  She has been coughing from 02/04/2020 when she was diagnosed with COVID-19.  She did not have any dyspnea or wheezing.  She did not have any bowel movement since Wednesday night and has been taking laxatives.  No chest pain or palpitations.  All history was obtained from the patient's mother as the patient was nonverbal close to her baseline.  Upon presentation to the emergency room, blood pressure was 160/94 with a heart rate of 146 with otherwise normal vital signs.  Labs revealed hyponatremia 147 and chloride of 110.  Anion gap was 25 and serum lipase was 22.  Total bili was 1.7.  CBC showed leukocytosis 13.5.  Hemoglobin hematocrits were 14.6 and 46.1 up from 11.3 and 34.9 on 03/02/2020.  Urinalysis was unremarkable except for 100 protein and 80 ketones.  Abdomen x-ray showed no acute abnormalities.  Abdominal and pelvic CT scan on 03/02/2020 showed moderate hiatal hernia with no acute intra-abdominal or pelvic abnormality. EKG showed supraventricular tachycardia with a rate of 146 with low voltage QRS.  It showed inferior T wave inversion.  The patient was given 4 mg IV Zofran and 3 L of IV normal saline.  She will be admitted to an  observation medically monitored bed for further evaluation and management. PAST MEDICAL HISTORY:   Past Medical History:  Diagnosis Date  . Anxiety   . Cerebral palsy ([place])   . Chronic kidney disease    stone  . Colon polyp   . GERD (gastroesophageal reflux disease)   . Urinary incontinence     PAST SURGICAL HISTORY:   Past Surgical History:  Procedure Laterality Date  . ABDOMINAL HYSTERECTOMY  2006  . BACK SURGERY  03/1991  . HIP ADDUCTOR TENOTOMY  2004  . REPAIR QUADRICEPS / HAMSTRING MUSCLE  1992    SOCIAL HISTORY:   Social History   Tobacco Use  . Smoking status: Never Smoker  . Smokeless tobacco: Never Used  Substance Use Topics  . Alcohol use: Never    FAMILY HISTORY:   Family History  Problem Relation Age of Onset  . Colon polyps Mother   . Heart disease Father   . Heart attack Father   . Thyroid disease Maternal Grandmother   . Heart disease Maternal Grandfather   . Heart disease Paternal Grandmother   . Heart disease Paternal Grandfather     DRUG ALLERGIES:   Allergies  Allergen Reactions  . Neomycin-Bacitracin Zn-Polymyx     Other reaction(s): HIVES   . Hydrocortisone Rash  . Levofloxacin Rash    Other reaction(s): SWELLING Other reaction(s): SWELLING Other reaction(s): SWELLING Other reaction(s): SWELLING   . Penicillins Rash    Other reaction(s): HIVES Other reaction(s):  HIVES Other reaction(s): HIVES Other reaction(s): HIVES     REVIEW OF SYSTEMS:   ROS As per history of present illness. All pertinent systems were reviewed above. Constitutional,  HEENT, cardiovascular, respiratory, GI, GU, musculoskeletal, neuro, psychiatric, endocrine,  integumentary and hematologic systems were reviewed and are otherwise  negative/unremarkable except for positive findings mentioned above in the HPI.   MEDICATIONS AT HOME:   Prior to Admission medications   Medication Sig Start Date End Date Taking? Authorizing Provider  acetaminophen  (TYLENOL) 500 MG tablet Take by mouth.    [provider]  ALPRAZolam (XANAX) 0.5 MG tablet TAKE 1 TABLET (0.5 MG TOTAL) BY MOUTH 2 (TWO) TIMES DAILY. FOR ANXIETY Patient not taking: Reported on 02/11/2020 08/25/19   Olin Hauser, DO  aspirin 81 MG EC tablet Take by mouth.    [provider]  bisacodyl (MAGIC BULLETS) 10 MG suppository Place rectally.    [provider]  Calcium-Vitamin D 600-200 MG-UNIT tablet Take by mouth.    [provider]  Cholecalciferol (D2000 ULTRA STRENGTH) 50 MCG (2000 UT) CAPS Take by mouth.    [provider]  Dexlansoprazole (DEXILANT) 30 MG capsule 30 mg daily. 11/27/15   [provider]  docusate sodium (COLACE CLEAR) 50 MG capsule Take by mouth.    [provider]  doxazosin (CARDURA) 2 MG tablet TAKE 1 TABLET (2 MG TOTAL) BY MOUTH AT BEDTIME. FOR BLADDER. 03/22/19   Olin Hauser, DO  fluticasone [facility]) 50 MCG/ACT nasal spray SPRAY 1 SPRAY INTO EACH NOSTRIL TWICE A DAY 02/15/20   Karamalegos, Devonne Doughty, DO  levocetirizine (XYZAL) 5 MG tablet Take 5 mg by mouth daily. 11/08/17   [provider]  lidocaine-prilocaine (EMLA) cream  09/07/18   [provider]  loratadine (CLARITIN) 10 MG tablet Take by mouth. 06/19/12   [provider]  meloxicam (MOBIC) 15 MG tablet TAKE 1 TABLET BY MOUTH DAILY FOR ARTHRITIS 02/10/20   Parks Ranger, Devonne Doughty, DO  methenamine (HIPREX) 1 g tablet TAKE 1 TABLET (1 G TOTAL) BY MOUTH 2 (TWO) TIMES DAILY WITH A MEAL. 08/05/19   Karamalegos, Devonne Doughty, DO  ondansetron (ZOFRAN ODT) 4 MG disintegrating tablet Take 1-2 tablets (4-8 mg total) by mouth every 8 (eight) hours as needed for nausea or vomiting. 02/23/20   Parks Ranger, Devonne Doughty, DO  ondansetron (ZOFRAN ODT) 4 MG disintegrating tablet Take 1 tablet (4 mg total) by mouth every 8 (eight) hours as needed. 02/25/20   Gregor Hams, MD  pantoprazole (PROTONIX) 20 MG tablet  Take 1 tablet (20 mg total) by mouth 2 (two) times daily. 02/25/20 03/26/20  Gregor Hams, MD  promethazine (PHENERGAN) 12.5 MG suppository Place 1 suppository (12.5 mg total) rectally every 8 (eight) hours as needed for nausea or vomiting. 02/23/20   Karamalegos, Devonne Doughty, DO  sucralfate (CARAFATE) 1 g tablet TAKE 1 TABLET BY MOUTH AT 10:30AM AND 1 TABLET AT BEDTIME EVERY DAY 01/05/18   [provider]  traZODone (DESYREL) 50 MG tablet TAKE 1 TABLET (50 MG TOTAL) BY MOUTH AT BEDTIME. FOR SLEEP 11/16/19   Karamalegos, Devonne Doughty, DO      VITAL SIGNS:  Blood pressure 93/60, pulse (!) 133, temperature 98.3 F (36.8 C), temperature source Oral, resp. rate 18, weight 47.6 kg, SpO2 100 %.  PHYSICAL EXAMINATION:  Physical Exam  GENERAL:  54 y.o.-year-old Caucasian female patient lying in the bed with no acute distress.  EYES: Pupils equal, round, reactive to light and  accommodation. No scleral icterus. Extraocular muscles intact.  HEENT: Head atraumatic, normocephalic. Oropharynx and nasopharynx clear.  NECK:  Supple, no jugular venous distention. No thyroid enlargement, no tenderness.  LUNGS: Normal breath sounds bilaterally, no wheezing, rales,rhonchi or crepitation. No use of accessory muscles of respiration.  CARDIOVASCULAR: Regular rate and rhythm, S1, S2 normal. No murmurs, rubs, or gallops.  ABDOMEN: Soft, nondistended, nontender. Bowel sounds present. No organomegaly or mass.  EXTREMITIES: No pedal edema, cyanosis, or clubbing.  NEUROLOGIC: No lateralizing signs.  The patient was nonverbal. PSYCHIATRIC: The patient is alert and nonverbal.   SKIN: No obvious rash, lesion, or ulcer.   LABORATORY PANEL:   CBC Recent Labs  Lab 03/06/20 1621  WBC 13.5*  HGB 14.6  HCT 46.1*  PLT 408*   ------------------------------------------------------------------------------------------------------------------  Chemistries  Recent Labs  Lab 03/06/20 1621  NA 147*  K 3.9  CL  110  CO2 12*  GLUCOSE 126*  BUN 12  CREATININE 0.90  CALCIUM 10.1  AST 23  ALT 19  ALKPHOS 68  BILITOT 1.7*   ------------------------------------------------------------------------------------------------------------------  Cardiac Enzymes No results for input(s): TROPONINI in the last 168 hours. ------------------------------------------------------------------------------------------------------------------  RADIOLOGY:  DG Abdomen 1 View  Result Date: 03/06/2020 CLINICAL DATA:  Nausea and vomiting EXAM: ABDOMEN - 1 VIEW COMPARISON:  02/13/2020 FINDINGS: Postsurgical changes are noted in the thoracolumbar spine with stable scoliosis concave to the right at the thoracolumbar junction. Scattered large and small bowel gas is noted. No obstructive changes are seen. No free air is noted. No abnormal mass or abnormal calcifications are seen. IMPRESSION: No acute abnormality noted. Electronically Signed   By: Inez Catalina M.D.   On: 03/06/2020 19:20   US Abdomen Limited RUQ  Result Date: 03/06/2020 CLINICAL DATA:  Vomiting for 1 month EXAM: ULTRASOUND ABDOMEN LIMITED RIGHT UPPER QUADRANT COMPARISON:  03/02/2020 FINDINGS: Gallbladder: No gallstones or wall thickening visualized. No sonographic Murphy sign noted by sonographer. Common bile duct: Diameter: 2 mm (assessment is however markedly limited by patient body habitus. Area felt to represent the common bile duct is measured and is anterior to portal venous structures. These areas are not well evaluated given limitations of the current exam. Liver: Small cysts in the liver are similar to recent CT evaluation. The liver is incompletely assessed due to overlying: Along the RIGHT hepatic lobe, bowel gas in the midline also limiting assessment of midline structures. Portal vein is patent on color Doppler imaging with normal direction of blood flow towards the liver. Again assessment of the porta hepatis is limited by body habitus. Other: No  ascites. IMPRESSION: 1. Limited evaluation due to body habitus and overlying bowel gas as described. 2. Within the limitations of the current study no ultrasound evidence of acute cholecystitis. Electronically Signed   By: Zetta Bills M.D.   On: 03/06/2020 17:49      IMPRESSION AND PLAN:   1.  Intractable nausea and vomiting with subsequent volume depletion. -The patient will be admitted to an observation medically monitored bed. -She will be kept n.p.o. except for medications for now. -She will be hydrated with IV normal saline. -We will obtain a GI consultation. -I notified Dr. Marius Ditch about the patient.  2.  Sinus tachycardia. -The patient will be monitored while she is here. -This is likely secondary to volume depletion and dehydration.  3.  GERD. -The patient be placed on IV PPI therapy with Protonix and will continue Carafate.  4.  Anxiety. -The patient will be continued on as needed Xanax.  5.  DVT prophylaxis. -Subcutaneous Lovenox.    All the records are reviewed and case discussed with ED provider. The plan of care was discussed in details with the patient (and family). I answered all questions. The patient agreed to proceed with the above mentioned plan. Further management will depend upon hospital course.   CODE STATUS: Full code  Status is: Observation  The patient remains OBS appropriate and will d/c before 2 midnights.  Dispo: The patient is from: Home              Anticipated d/c is to: Home              Anticipated d/c date is: 1 day              Patient currently is not medically stable to d/c.   TOTAL TIME TAKING CARE OF THIS PATIENT: 55 minutes.    Christel Mormon M.D on 03/06/2020 at 10:08 PM  [facility]   From 7 PM-7 AM, contact night-coverage [email or web address]  CC: Primary care physician; Olin Hauser, DO   Note: This dictation was prepared with Dragon dictation along with smaller phrase technology. Any transcriptional  errors that result from this process are unintentional.

## 2020-03-06 NOTE — ED Notes (Signed)
Per IV RN she was unable to get blood on patient, lab contacted to assist with blood draw.

## 2020-03-06 NOTE — ED Triage Notes (Signed)
Patient is non verbal.  Says patient started vomiting on Saturday.  No eating.  She has been trying to give her electrolytes.  Patient is alert.

## 2020-03-06 NOTE — ED Provider Notes (Signed)
[facility] Emergency Department Provider Note   ____________________________________________   I have reviewed the triage vital signs and the nursing notes.   HISTORY  Chief Complaint Emesis   History limited by and level 5 caveat due to: Chronic medical illness.   HPI Loretta Savage is a 54 y.o. female who presents to the emergency department today because of concern for continued nausea and vomiting and possible dehydration. The patient was brought in by mother, and history is obtained from mother. Patient has had issues with nausea and vomiting for almost one month. Has been seen in the emergency department multiple times for these symptoms. Has been seen by GI and has an endoscopy scheduled. Mother states that she thinks the medication that she has been giving occasionally works. Has not noticed any fever. Patient does appear to have occasional painful episodes.   Records reviewed. Per medical record review patient has a history of multiple ER visits for similar issues in the past month. Has undergone two CT abd/pel for same issues with negative findings.   Past Medical History:  Diagnosis Date  . Anxiety   . Cerebral palsy ([place])   . Chronic kidney disease    stone  . Colon polyp   . GERD (gastroesophageal reflux disease)   . Urinary incontinence     Patient Active Problem List   Diagnosis Date Noted  . Scoliosis 11/23/2019  . Bowel and bladder incontinence 11/23/2019  . 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 Anxiety 02/04/2018  . Insomnia 02/04/2018  . 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 GERD (gastroesophageal reflux disease) 02/04/2018  . 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 Seasonal allergies 02/04/2018  . Chronic interstitial cystitis 02/04/2018    Past Surgical History:  Procedure Laterality Date  . ABDOMINAL HYSTERECTOMY   2006  . BACK SURGERY  03/1991  . HIP ADDUCTOR TENOTOMY  2004  . REPAIR QUADRICEPS / HAMSTRING MUSCLE  1992    Prior to Admission medications   Medication Sig Start Date End Date Taking? Authorizing Provider  acetaminophen (TYLENOL) 500 MG tablet Take by mouth.    [provider]  ALPRAZolam (XANAX) 0.5 MG tablet TAKE 1 TABLET (0.5 MG TOTAL) BY MOUTH 2 (TWO) TIMES DAILY. FOR ANXIETY Patient not taking: Reported on 02/11/2020 08/25/19   Olin Hauser, DO  aspirin 81 MG EC tablet Take by mouth.    [provider]  bisacodyl (MAGIC BULLETS) 10 MG suppository Place rectally.    [provider]  Calcium-Vitamin D 600-200 MG-UNIT tablet Take by mouth.    [provider]  Cholecalciferol (D2000 ULTRA STRENGTH) 50 MCG (2000 UT) CAPS Take by mouth.    [provider]  Dexlansoprazole (DEXILANT) 30 MG capsule 30 mg daily. 11/27/15   [provider]  docusate sodium (COLACE CLEAR) 50 MG capsule Take by mouth.    [provider]  doxazosin (CARDURA) 2 MG tablet TAKE 1 TABLET (2 MG TOTAL) BY MOUTH AT BEDTIME. FOR BLADDER. 03/22/19   Olin Hauser, DO  fluticasone [facility]) 50 MCG/ACT nasal spray SPRAY 1 SPRAY INTO EACH NOSTRIL TWICE A DAY 02/15/20   Karamalegos, Devonne Doughty, DO  levocetirizine (XYZAL) 5 MG tablet Take 5 mg by mouth daily. 11/08/17   [provider]  lidocaine-prilocaine (EMLA) cream  09/07/18   [provider]  loratadine (CLARITIN) 10 MG tablet Take by mouth. 06/19/12   [provider]  meloxicam (MOBIC) 15 MG tablet TAKE 1 TABLET BY MOUTH DAILY FOR ARTHRITIS 02/10/20   Parks Ranger, Devonne Doughty, DO  methenamine (HIPREX) 1 g tablet TAKE 1 TABLET (1 G TOTAL) BY MOUTH 2 (TWO) TIMES DAILY WITH A MEAL. 08/05/19   Karamalegos, Devonne Doughty, DO  ondansetron (ZOFRAN ODT) 4 MG disintegrating tablet Take 1-2 tablets (4-8 mg total) by mouth every 8 (eight) hours as needed for nausea or vomiting.  02/23/20   Parks Ranger, Devonne Doughty, DO  ondansetron (ZOFRAN ODT) 4 MG disintegrating tablet Take 1 tablet (4 mg total) by mouth every 8 (eight) hours as needed. 02/25/20   Gregor Hams, MD  pantoprazole (PROTONIX) 20 MG tablet Take 1 tablet (20 mg total) by mouth 2 (two) times daily. 02/25/20 03/26/20  Gregor Hams, MD  promethazine (PHENERGAN) 12.5 MG suppository Place 1 suppository (12.5 mg total) rectally every 8 (eight) hours as needed for nausea or vomiting. 02/23/20   Karamalegos, Devonne Doughty, DO  sucralfate (CARAFATE) 1 g tablet TAKE 1 TABLET BY MOUTH AT 10:30AM AND 1 TABLET AT BEDTIME EVERY DAY 01/05/18   [provider]  traZODone (DESYREL) 50 MG tablet TAKE 1 TABLET (50 MG TOTAL) BY MOUTH AT BEDTIME. FOR SLEEP 11/16/19   Olin Hauser, DO    Allergies Neomycin-bacitracin zn-polymyx, Hydrocortisone, Levofloxacin, and Penicillins  Family History  Problem Relation Age of Onset  . Colon polyps Mother   . Heart disease Father   . Heart attack Father   . Thyroid disease Maternal Grandmother   . Heart disease Maternal Grandfather   . Heart disease Paternal Grandmother   . Heart disease Paternal Grandfather     Social History Social History   Tobacco Use  . Smoking status: Never Smoker  . Smokeless tobacco: Never Used  Substance Use Topics  . Alcohol use: Never  . Drug use: Never    Review of Systems Unable to obtain secondary to chronic medical condition ____________________________________________   PHYSICAL EXAM:  VITAL SIGNS: ED Triage Vitals  Enc Vitals Group     BP 03/06/20 1416 (!) 116/94     Pulse Rate 03/06/20 1416 (!) 146     Resp 03/06/20 1416 18     Temp 03/06/20 1416 98.3 F (36.8 C)     Temp Source 03/06/20 1416 Oral     SpO2 03/06/20 1416 98 %     Weight 03/06/20 1417 105 lb (47.6 kg)   Constitutional: Awake, alert. Non verbal. Eyes: Conjunctivae are normal.  ENT      Head: Normocephalic and atraumatic.      Nose: No  congestion/rhinnorhea.      Mouth/Throat: Mucous membranes are moist.      Neck: No stridor. Hematological/Lymphatic/Immunilogical: No cervical lymphadenopathy. Cardiovascular: Tachycardic, regular rhythm.  No murmurs, rubs, or gallops.  Respiratory: Normal respiratory effort without tachypnea nor retractions. Breath sounds are clear and equal bilaterally. No wheezes/rales/rhonchi. Gastrointestinal: Soft and non tender. No rebound. No guarding.  Genitourinary: Deferred Musculoskeletal: Normal range of motion in all extremities. No lower extremity edema. Neurologic:  Sequale of chronic medical condition.  Skin:  Skin is warm, dry and intact. No rash noted. ____________________________________________    LABS (pertinent positives/negatives)  CBC wbc 6.9, hgb 9.4, plt 251 CMP na 147, k 3.9, glu 126, cr 0.90 UA clear, ketones 80, protein 100, wbc 0-5 Lipase 22 ____________________________________________   EKG  None  ____________________________________________    RADIOLOGY  RUQ [place] Negative gallbladder  ABD x-ray No acute abnormality  ____________________________________________  PROCEDURES  Procedures  ____________________________________________   INITIAL IMPRESSION / ASSESSMENT AND PLAN / ED COURSE  Pertinent labs & imaging results that were available during my care of the patient were reviewed by me and considered in my medical decision making (see chart for details).   Patient presents accompanied by mother for persistent nausea and vomiting. This is now the fourth ed visit in the past month for similar symptoms. On my exam patient in no distress, however significantly tachycardic. Did have multiple episodes of vomiting in the ed. [place] and x-ray of the abdomen negative for acute abnormality. Will plan on admission.  ____________________________________________   FINAL CLINICAL IMPRESSION(S) / ED DIAGNOSES  Final diagnoses:  Vomiting     Note: This  dictation was prepared with Dragon dictation. Any transcriptional errors that result from this process are unintentional     Nance Pear, MD 03/07/20 [phone]

## 2020-03-07 ENCOUNTER — Encounter: Payer: Self-pay | Admitting: Internal Medicine

## 2020-03-07 ENCOUNTER — Encounter: Payer: Self-pay | Admitting: Family Medicine

## 2020-03-07 DIAGNOSIS — K449 Diaphragmatic hernia without obstruction or gangrene: Secondary | ICD-10-CM | POA: Diagnosis not present

## 2020-03-07 DIAGNOSIS — E876 Hypokalemia: Secondary | ICD-10-CM | POA: Diagnosis not present

## 2020-03-07 DIAGNOSIS — G4730 Sleep apnea, unspecified: Secondary | ICD-10-CM | POA: Diagnosis not present

## 2020-03-07 DIAGNOSIS — R1110 Vomiting, unspecified: Secondary | ICD-10-CM | POA: Diagnosis not present

## 2020-03-07 DIAGNOSIS — R404 Transient alteration of awareness: Secondary | ICD-10-CM | POA: Diagnosis not present

## 2020-03-07 DIAGNOSIS — K209 Esophagitis, unspecified without bleeding: Secondary | ICD-10-CM | POA: Diagnosis not present

## 2020-03-07 DIAGNOSIS — R110 Nausea: Secondary | ICD-10-CM | POA: Diagnosis not present

## 2020-03-07 DIAGNOSIS — A419 Sepsis, unspecified organism: Secondary | ICD-10-CM | POA: Diagnosis not present

## 2020-03-07 DIAGNOSIS — Z888 Allergy status to other drugs, medicaments and biological substances status: Secondary | ICD-10-CM | POA: Diagnosis not present

## 2020-03-07 DIAGNOSIS — Z8616 Personal history of COVID-19: Secondary | ICD-10-CM | POA: Diagnosis not present

## 2020-03-07 DIAGNOSIS — E861 Hypovolemia: Secondary | ICD-10-CM | POA: Diagnosis not present

## 2020-03-07 DIAGNOSIS — Z87448 Personal history of other diseases of urinary system: Secondary | ICD-10-CM | POA: Diagnosis not present

## 2020-03-07 DIAGNOSIS — G800 Spastic quadriplegic cerebral palsy: Secondary | ICD-10-CM | POA: Diagnosis not present

## 2020-03-07 DIAGNOSIS — I9589 Other hypotension: Secondary | ICD-10-CM | POA: Diagnosis not present

## 2020-03-07 DIAGNOSIS — Z880 Allergy status to penicillin: Secondary | ICD-10-CM | POA: Diagnosis not present

## 2020-03-07 DIAGNOSIS — Z7189 Other specified counseling: Secondary | ICD-10-CM | POA: Diagnosis not present

## 2020-03-07 DIAGNOSIS — M1990 Unspecified osteoarthritis, unspecified site: Secondary | ICD-10-CM | POA: Diagnosis not present

## 2020-03-07 DIAGNOSIS — Z7401 Bed confinement status: Secondary | ICD-10-CM | POA: Diagnosis not present

## 2020-03-07 DIAGNOSIS — R112 Nausea with vomiting, unspecified: Secondary | ICD-10-CM | POA: Diagnosis not present

## 2020-03-07 DIAGNOSIS — Z66 Do not resuscitate: Secondary | ICD-10-CM | POA: Diagnosis present

## 2020-03-07 DIAGNOSIS — G9340 Encephalopathy, unspecified: Secondary | ICD-10-CM | POA: Diagnosis present

## 2020-03-07 DIAGNOSIS — R1310 Dysphagia, unspecified: Secondary | ICD-10-CM | POA: Diagnosis not present

## 2020-03-07 DIAGNOSIS — Z515 Encounter for palliative care: Secondary | ICD-10-CM | POA: Diagnosis not present

## 2020-03-07 DIAGNOSIS — E162 Hypoglycemia, unspecified: Secondary | ICD-10-CM | POA: Diagnosis present

## 2020-03-07 DIAGNOSIS — D649 Anemia, unspecified: Secondary | ICD-10-CM | POA: Diagnosis not present

## 2020-03-07 DIAGNOSIS — E43 Unspecified severe protein-calorie malnutrition: Secondary | ICD-10-CM | POA: Diagnosis not present

## 2020-03-07 DIAGNOSIS — E871 Hypo-osmolality and hyponatremia: Secondary | ICD-10-CM | POA: Diagnosis present

## 2020-03-07 DIAGNOSIS — R509 Fever, unspecified: Secondary | ICD-10-CM | POA: Diagnosis not present

## 2020-03-07 DIAGNOSIS — K219 Gastro-esophageal reflux disease without esophagitis: Secondary | ICD-10-CM | POA: Diagnosis not present

## 2020-03-07 DIAGNOSIS — M6249 Contracture of muscle, multiple sites: Secondary | ICD-10-CM | POA: Diagnosis not present

## 2020-03-07 DIAGNOSIS — R000 Tachycardia, unspecified: Secondary | ICD-10-CM | POA: Diagnosis not present

## 2020-03-07 DIAGNOSIS — N189 Chronic kidney disease, unspecified: Secondary | ICD-10-CM | POA: Diagnosis present

## 2020-03-07 DIAGNOSIS — K5909 Other constipation: Secondary | ICD-10-CM | POA: Diagnosis not present

## 2020-03-07 DIAGNOSIS — D500 Iron deficiency anemia secondary to blood loss (chronic): Secondary | ICD-10-CM | POA: Diagnosis not present

## 2020-03-07 DIAGNOSIS — Z8249 Family history of ischemic heart disease and other diseases of the circulatory system: Secondary | ICD-10-CM | POA: Diagnosis not present

## 2020-03-07 DIAGNOSIS — E860 Dehydration: Secondary | ICD-10-CM | POA: Diagnosis present

## 2020-03-07 DIAGNOSIS — F72 Severe intellectual disabilities: Secondary | ICD-10-CM | POA: Diagnosis not present

## 2020-03-07 DIAGNOSIS — K2101 Gastro-esophageal reflux disease with esophagitis, with bleeding: Secondary | ICD-10-CM | POA: Diagnosis present

## 2020-03-07 DIAGNOSIS — R194 Change in bowel habit: Secondary | ICD-10-CM | POA: Diagnosis not present

## 2020-03-07 DIAGNOSIS — I959 Hypotension, unspecified: Secondary | ICD-10-CM | POA: Diagnosis present

## 2020-03-07 DIAGNOSIS — G809 Cerebral palsy, unspecified: Secondary | ICD-10-CM | POA: Diagnosis not present

## 2020-03-07 DIAGNOSIS — Z87440 Personal history of urinary (tract) infections: Secondary | ICD-10-CM | POA: Diagnosis not present

## 2020-03-07 DIAGNOSIS — M2550 Pain in unspecified joint: Secondary | ICD-10-CM | POA: Diagnosis not present

## 2020-03-07 DIAGNOSIS — B3781 Candidal esophagitis: Secondary | ICD-10-CM | POA: Diagnosis not present

## 2020-03-07 DIAGNOSIS — R569 Unspecified convulsions: Secondary | ICD-10-CM | POA: Diagnosis not present

## 2020-03-07 DIAGNOSIS — K2100 Gastro-esophageal reflux disease with esophagitis, without bleeding: Secondary | ICD-10-CM | POA: Diagnosis not present

## 2020-03-07 DIAGNOSIS — R05 Cough: Secondary | ICD-10-CM | POA: Diagnosis not present

## 2020-03-07 DIAGNOSIS — F419 Anxiety disorder, unspecified: Secondary | ICD-10-CM | POA: Diagnosis not present

## 2020-03-07 DIAGNOSIS — I950 Idiopathic hypotension: Secondary | ICD-10-CM | POA: Diagnosis not present

## 2020-03-07 DIAGNOSIS — Z8371 Family history of colonic polyps: Secondary | ICD-10-CM | POA: Diagnosis not present

## 2020-03-07 DIAGNOSIS — R600 Localized edema: Secondary | ICD-10-CM | POA: Diagnosis not present

## 2020-03-07 DIAGNOSIS — I471 Supraventricular tachycardia: Secondary | ICD-10-CM | POA: Diagnosis present

## 2020-03-07 LAB — COMPREHENSIVE METABOLIC PANEL
ALT: 14 U/L (ref 0–44)
AST: 17 U/L (ref 15–41)
Albumin: 2.8 g/dL — ABNORMAL LOW (ref 3.5–5.0)
Alkaline Phosphatase: 39 U/L (ref 38–126)
Anion gap: 10 (ref 5–15)
BUN: 7 mg/dL (ref 6–20)
CO2: 18 mmol/L — ABNORMAL LOW (ref 22–32)
Calcium: 8 mg/dL — ABNORMAL LOW (ref 8.9–10.3)
Chloride: 123 mmol/L — ABNORMAL HIGH (ref 98–111)
Creatinine, Ser: 0.46 mg/dL (ref 0.44–1.00)
GFR calc Af Amer: >60 mL/min (ref >60)
GFR calc non Af Amer: >60 mL/min (ref >60)
Glucose, Bld: 89 mg/dL (ref 70–99)
Potassium: 2.9 mmol/L — ABNORMAL LOW (ref 3.5–5.1)
Sodium: 151 mmol/L — ABNORMAL HIGH (ref 135–145)
Total Bilirubin: 0.9 mg/dL (ref 0.3–1.2)
Total Protein: 5.3 g/dL — ABNORMAL LOW (ref 6.5–8.1)

## 2020-03-07 LAB — IRON AND TIBC
Iron: 28 ug/dL (ref 28–170)
Saturation Ratios: 9 % — ABNORMAL LOW (ref 10.4–31.8)
TIBC: 318 ug/dL (ref 250–450)
UIBC: 290 ug/dL

## 2020-03-07 LAB — CBC
HCT: 31 % — ABNORMAL LOW (ref 36.0–46.0)
Hemoglobin: 9.4 g/dL — ABNORMAL LOW (ref 12.0–15.0)
MCH: 29.9 pg (ref 26.0–34.0)
MCHC: 30.3 g/dL (ref 30.0–36.0)
MCV: 98.7 fL (ref 80.0–100.0)
Platelets: 251 10*3/uL (ref 150–400)
RBC: 3.14 MIL/uL — ABNORMAL LOW (ref 3.87–5.11)
RDW: 14.6 % (ref 11.5–15.5)
WBC: 6.9 10*3/uL (ref 4.0–10.5)
nRBC: 0 % (ref 0.0–0.2)

## 2020-03-07 LAB — VITAMIN B12: Vitamin B-12: 2738 pg/mL — ABNORMAL HIGH (ref 180–914)

## 2020-03-07 LAB — FERRITIN: Ferritin: 13 ng/mL (ref 11–307)

## 2020-03-07 LAB — FOLATE: Folate: 21.6 ng/mL (ref >5.9)

## 2020-03-07 MED ORDER — SUCRALFATE 1 GM/10ML PO SUSP
1.0000 g | Freq: Three times a day (TID) | ORAL | Status: DC
  Administered 2020-03-07 – 2020-03-22 (×19): 1 g via ORAL
  Filled 2020-03-07 (×67): qty 10

## 2020-03-07 MED ORDER — SODIUM CHLORIDE 0.9 % IV SOLN: INTRAVENOUS | Status: DC

## 2020-03-07 MED ORDER — POTASSIUM CHLORIDE 10 MEQ/100ML IV SOLN
10.0000 meq | INTRAVENOUS | Status: AC
  Administered 2020-03-07 (×2): 10 meq via INTRAVENOUS
  Filled 2020-03-07: qty 100

## 2020-03-07 MED ORDER — POTASSIUM CHLORIDE CRYS ER 20 MEQ PO TBCR
40.0000 meq | EXTENDED_RELEASE_TABLET | Freq: Once | ORAL | Status: AC
  Administered 2020-03-07: 40 meq via ORAL
  Filled 2020-03-07: qty 2

## 2020-03-07 MED ORDER — PANTOPRAZOLE SODIUM 40 MG IV SOLR
40.0000 mg | Freq: Two times a day (BID) | INTRAVENOUS | Status: DC
  Administered 2020-03-07 – 2020-03-20 (×25): 40 mg via INTRAVENOUS
  Filled 2020-03-07 (×25): qty 40

## 2020-03-07 NOTE — Progress Notes (Signed)
md aware of pts  Hr being elevated .  He thinks this may  Be chronic for her.  Will cont to moniter hr q 4 hrs

## 2020-03-07 NOTE — Consult Note (Signed)
Cephas Darby, MD [address]  [place]  [place], [place] [address]  Main: [phone]  Fax: [phone] Pager: [phone]   Consultation  Referring Provider:     No ref. provider found Primary Care Physician:  Olin Hauser, DO Primary Gastroenterologist:  Dr. Alice Reichert         Reason for Consultation:     Intractable nausea and vomiting  Date of Admission:  03/06/2020 Date of Consultation:  03/07/2020         HPI:   NADINA Savage is a 54 y.o. female history of anxiety, GERD, cerebral palsy presented to ER with intractable nausea and vomiting. She recently saw Dr. Alice Reichert on 5/6 secondary to vomiting. She has history of chronic GERD for which she was previously on Dexilant 30 mg daily, switched to Protonix 20 mg twice daily, increased to 40 mg twice daily. She is also on scheduled antiemetics which does not help with episodes. She reports having episodes of vomiting about 3-6 times per day. These episodes have resulted in electrolyte abnormalities abnormalities, SVT. She also has normocytic anemia, hemoglobin 9.4 CT abdomen and pelvis revealed moderate size hiatal hernia Pt is non verbal from CP, mother is bedside, she is also concerned about weight loss. Has constipation, last BM about a week ago  NSAIDs: Meloxicam and Elmiron  Antiplts/Anticoagulants/Anti thrombotics: None  GI Procedures: Colonoscopy at [facility] in 2014  Past Medical History:  Diagnosis Date  . Anxiety   . Cerebral palsy ([place])   . Chronic kidney disease    stone  . Colon polyp   . GERD (gastroesophageal reflux disease)   . Mentally challenged   . Sleep apnea   . Urinary incontinence     Past Surgical History:  Procedure Laterality Date  . ABDOMINAL HYSTERECTOMY  2006  . BACK SURGERY  03/1991  . HIP ADDUCTOR TENOTOMY  2004  . REPAIR QUADRICEPS / HAMSTRING MUSCLE  1992  . SIGMOIDOSCOPY      Prior to Admission medications   Medication Sig Start Date End Date Taking?  Authorizing Provider  acetaminophen (TYLENOL) 500 MG tablet Take 1,000 mg by mouth every 6 (six) hours as needed.    Yes [provider]  aspirin 81 MG EC tablet Take 81 mg by mouth daily.    Yes [provider]  bisacodyl (MAGIC BULLETS) 10 MG suppository Place 10 mg rectally daily as needed.    Yes [provider]  Calcium-Vitamin D 600-200 MG-UNIT tablet Take 1 tablet by mouth daily.    Yes [provider]  Cholecalciferol (D2000 ULTRA STRENGTH) 50 MCG (2000 UT) CAPS Take 1 capsule by mouth daily.    Yes [provider]  Dexlansoprazole (DEXILANT) 30 MG capsule 30 mg daily. 11/27/15  Yes [provider]  docusate sodium (COLACE CLEAR) 50 MG capsule Take 50 mg by mouth daily.    Yes [provider]  doxazosin (CARDURA) 2 MG tablet TAKE 1 TABLET (2 MG TOTAL) BY MOUTH AT BEDTIME. FOR BLADDER. 03/22/19  Yes Karamalegos, Devonne Doughty, DO  fluticasone (FLONASE) 50 MCG/ACT nasal spray SPRAY 1 SPRAY INTO EACH NOSTRIL TWICE A DAY 02/15/20  Yes Karamalegos, Devonne Doughty, DO  levocetirizine (XYZAL) 5 MG tablet Take 5 mg by mouth daily. 11/08/17  Yes [provider]  loratadine (CLARITIN) 10 MG tablet Take 10 mg by mouth daily as needed.  06/19/12  Yes [provider]  meloxicam (MOBIC) 15 MG tablet TAKE 1 TABLET BY MOUTH DAILY  FOR ARTHRITIS 02/10/20  Yes Karamalegos, Devonne Doughty, DO  methenamine (HIPREX) 1 g tablet TAKE 1 TABLET (1 G TOTAL) BY MOUTH 2 (TWO) TIMES DAILY WITH A MEAL. 08/05/19  Yes Karamalegos, Devonne Doughty, DO  ondansetron (ZOFRAN ODT) 4 MG disintegrating tablet Take 1-2 tablets (4-8 mg total) by mouth every 8 (eight) hours as needed for nausea or vomiting. 02/23/20  Yes Karamalegos, Alexander J, DO  ondansetron (ZOFRAN ODT) 4 MG disintegrating tablet Take 1 tablet (4 mg total) by mouth every 8 (eight) hours as needed. 02/25/20  Yes Gregor Hams, MD  pantoprazole (PROTONIX) 20 MG tablet Take 1 tablet (20 mg total) by  mouth 2 (two) times daily. 02/25/20 03/26/20 Yes Gregor Hams, MD  promethazine (PHENERGAN) 12.5 MG suppository Place 1 suppository (12.5 mg total) rectally every 8 (eight) hours as needed for nausea or vomiting. 02/23/20  Yes Karamalegos, Devonne Doughty, DO  sucralfate (CARAFATE) 1 g tablet TAKE 1 TABLET BY MOUTH AT 10:30AM AND 1 TABLET AT BEDTIME EVERY DAY 01/05/18  Yes [provider]  traZODone (DESYREL) 50 MG tablet TAKE 1 TABLET (50 MG TOTAL) BY MOUTH AT BEDTIME. FOR SLEEP 11/16/19  Yes Karamalegos, Devonne Doughty, DO  ALPRAZolam (XANAX) 0.5 MG tablet TAKE 1 TABLET (0.5 MG TOTAL) BY MOUTH 2 (TWO) TIMES DAILY. FOR ANXIETY Patient not taking: Reported on 02/11/2020 08/25/19   Olin Hauser, DO  lidocaine-prilocaine (EMLA) cream  09/07/18   [provider]  traMADol (ULTRAM) 50 MG tablet Take 50 mg by mouth daily as needed.    [provider]    Current Facility-Administered Medications:  .  0.9 %  sodium chloride infusion, , Intravenous, Continuous, Sreenath, Sudheer B, MD, Last Rate: 100 mL/hr at 03/07/20 1531, Rate Verify at 03/07/20 1531 .  acetaminophen (TYLENOL) tablet 650 mg, 650 mg, Oral, Q6H PRN **OR** acetaminophen (TYLENOL) suppository 650 mg, 650 mg, Rectal, Q6H PRN, Mansy, Jan A, MD .  doxazosin (CARDURA) tablet 2 mg, 2 mg, Oral, QHS, Mansy, Jan A, MD .  fluticasone Asencion Islam) 50 MCG/ACT nasal spray 2 spray, 2 spray, Each Nare, Daily, Mansy, Jan A, MD, 2 spray at 03/07/20 0933 .  loratadine (CLARITIN) tablet 10 mg, 10 mg, Oral, Daily, Mansy, Jan A, MD, 10 mg at 03/07/20 0933 .  methenamine (MANDELAMINE) tablet 1 g, 1 g, Oral, BID WC, Mansy, Jan A, MD, 1 g at 03/07/20 0933 .  ondansetron (ZOFRAN) tablet 4 mg, 4 mg, Oral, Q6H PRN **OR** ondansetron (ZOFRAN) injection 4 mg, 4 mg, Intravenous, Q6H PRN, Mansy, Jan A, MD .  ondansetron (ZOFRAN-ODT) disintegrating tablet 4-8 mg, 4-8 mg, Oral, Q8H PRN, Mansy, Jan A, MD .  pantoprazole (PROTONIX) injection 40  mg, 40 mg, Intravenous, Q12H, Sreenath, Sudheer B, MD, 40 mg at 03/07/20 0933 .  sucralfate (CARAFATE) 1 GM/10ML suspension 1 g, 1 g, Oral, TID WC & HS, Sreenath, Sudheer B, MD, 1 g at 03/07/20 1341 .  traZODone (DESYREL) tablet 25 mg, 25 mg, Oral, QHS PRN, Mansy, Jan A, MD .  traZODone (DESYREL) tablet 50 mg, 50 mg, Oral, QHS, Mansy, Arvella Merles, MD   Family History  Problem Relation Age of Onset  . Colon polyps Mother   . Heart disease Father   . Heart attack Father   . Thyroid disease Maternal Grandmother   . Heart disease Maternal Grandfather   . Heart disease Paternal Grandmother   . Heart disease Paternal Grandfather      Social History   Tobacco Use  .  Smoking status: Never Smoker  . Smokeless tobacco: Never Used  Substance Use Topics  . Alcohol use: Never  . Drug use: Never    Allergies as of 03/06/2020 - Review Complete 03/06/2020  Allergen Reaction Noted  . Neomycin-bacitracin zn-polymyx  02/24/2013  . Hydrocortisone Rash 07/14/2014  . Levofloxacin Rash 02/24/2013  . Penicillins Rash 02/24/2013    Review of Systems:    All systems reviewed and negative except where noted in HPI.   Physical Exam:  Vital signs in last 24 hours: Temp:  [97.3 F (36.3 C)-99.9 F (37.7 C)] 99.9 F (37.7 C) (05/11 1534) Pulse Rate:  [112-133] 113 (05/11 1534) Resp:  [13-19] 18 (05/11 1534) BP: (93-127)/(55-75) 105/69 (05/11 1534) SpO2:  [97 %-100 %] 99 % (05/11 1534)   General:  NAD, non verbal Head:  Normocephalic and atraumatic. Eyes:   No icterus.   Conjunctiva pink. PERRLA. Ears:  Normal auditory acuity. Neck:  Supple; no masses or thyroidomegaly Lungs: Respirations even and unlabored. Lungs clear to auscultation bilaterally.   No wheezes, crackles, or rhonchi.  Heart:  Regular rate and rhythm;  Without murmur, clicks, rubs or gallops Abdomen:  Soft, nondistended, nontender. Normal bowel sounds. No appreciable masses or hepatomegaly.  No rebound or guarding.  Rectal:  Not  performed. Msk:  Symmetrical without gross deformities. Extremities:  Without edema, cyanosis or clubbing. Neurologic:  Alert and oriented x0 Skin:  Intact without significant lesions or rashes.  LAB RESULTS: CBC Latest Ref Rng & Units 03/07/2020 03/06/2020 03/02/2020  WBC 4.0 - 10.5 K/uL 6.9 13.5(H) 6.6  Hemoglobin 12.0 - 15.0 g/dL 9.4(L) 14.6 11.3(L)  Hematocrit 36.0 - 46.0 % 31.0(L) 46.1(H) 34.9(L)  Platelets 150 - 400 K/uL 251 408(H) 206    BMET BMP Latest Ref Rng & Units 03/07/2020 03/06/2020 03/02/2020  Glucose 70 - 99 mg/dL 89 126(H) 97  BUN 6 - 20 mg/dL _0 Creatinine 0.44 - 1.00 mg/dL 0.46 0.90 0.36(L)  BUN/Creat Ratio 6 - 22 (calc) - - -  Sodium 135 - 145 mmol/L 151(H) 147(H) 133(L)  Potassium 3.5 - 5.1 mmol/L 2.9(L) 3.9 3.8  Chloride 98 - 111 mmol/L 123(H) 110 97(L)  CO2 22 - 32 mmol/L 18(L) 12(L) 25  Calcium 8.9 - 10.3 mg/dL 8.0(L) 10.1 8.6(L)    LFT Hepatic Function Latest Ref Rng & Units 03/07/2020 03/06/2020 03/02/2020  Total Protein 6.5 - 8.1 g/dL 5.3(L) 7.8 6.1(L)  Albumin 3.5 - 5.0 g/dL 2.8(L) 4.3 3.4(L)  AST 15 - 41 U/L 17 23 34  ALT 0 - 44 U/L _1 Alk Phosphatase 38 - 126 U/L 39 68 49  Total Bilirubin 0.3 - 1.2 mg/dL 0.9 1.7(H) 0.9     STUDIES: DG Abdomen 1 View  Result Date: 03/06/2020 CLINICAL DATA:  Nausea and vomiting EXAM: ABDOMEN - 1 VIEW COMPARISON:  02/13/2020 FINDINGS: Postsurgical changes are noted in the thoracolumbar spine with stable scoliosis concave to the right at the thoracolumbar junction. Scattered large and small bowel gas is noted. No obstructive changes are seen. No free air is noted. No abnormal mass or abnormal calcifications are seen. IMPRESSION: No acute abnormality noted. Electronically Signed   By: Inez Catalina M.D.   On: 03/06/2020 19:20   US Abdomen Limited RUQ  Result Date: 03/06/2020 CLINICAL DATA:  Vomiting for 1 month EXAM: ULTRASOUND ABDOMEN LIMITED RIGHT UPPER QUADRANT COMPARISON:  03/02/2020 FINDINGS: Gallbladder: No  gallstones or wall thickening visualized. No sonographic Murphy sign noted by sonographer. Common bile duct:  Diameter: 2 mm (assessment is however markedly limited by patient body habitus. Area felt to represent the common bile duct is measured and is anterior to portal venous structures. These areas are not well evaluated given limitations of the current exam. Liver: Small cysts in the liver are similar to recent CT evaluation. The liver is incompletely assessed due to overlying: Along the RIGHT hepatic lobe, bowel gas in the midline also limiting assessment of midline structures. Portal vein is patent on color Doppler imaging with normal direction of blood flow towards the liver. Again assessment of the porta hepatis is limited by body habitus. Other: No ascites. IMPRESSION: 1. Limited evaluation due to body habitus and overlying bowel gas as described. 2. Within the limitations of the current study no ultrasound evidence of acute cholecystitis. Electronically Signed   By: Zetta Bills M.D.   On: 03/06/2020 17:49      Impression / Plan:   Loretta Savage is a 54 y.o. female with history of cerebral palsy, chronic GERD, chronic nausea and emesis which is sometimes coffee-ground is admitted with intractable nausea and vomiting leading to dehydration and electrolyte abnormalities, SVT. Patient has moderate hiatal hernia based on imaging  Differentials include erosive esophagitis or H. pylori gastritis or peptic ulcer disease or gastroparesis  Continue pantoprazole drip 40 mg IV twice daily Scheduled antiemetics alternating with Zofran and Phenergan  Recommend EGD after correction of hypokalemia, goal potassium greater than 3 Ok with clears N.p.o. after midnight   Thank you for involving me in the care of this patient.      LOS: 0 days   Sherri Sear, MD  03/07/2020, 4:51 PM   Note: This dictation was prepared with Dragon dictation along with smaller phrase technology. Any transcriptional  errors that result from this process are unintentional.

## 2020-03-07 NOTE — Progress Notes (Signed)
PROGRESS NOTE    JORI JAMIL  [id number] DOB: [birth year] DOA: 03/06/2020 PCP: Olin Hauser, DO   Brief Narrative:  54 y.o. Caucasian female with a known history of anxiety, GERD and cerebral palsy, who presented to the emergency room with acute onset of intractable nausea and vomiting which has been intermittent over the last few weeks since 4/18.  The patient was scheduled to have an EGD by Dr. Alice Reichert on Wednesday.  Her mother and caregiver believes that she may have had mild abdominal discomfort.  She has been coughing from 02/04/2020 when she was diagnosed with COVID-19.  She did not have any dyspnea or wheezing.  She did not have any bowel movement since Wednesday night and has been taking laxatives.  No chest pain or palpitations.  All history was obtained from the patient's mother as the patient was nonverbal close to her baseline.  5/11: Patient seen and examined.  Patient's mother is at bedside.  Patient remains nonverbal at baseline.  Per the mother the patient has not been vomiting since admission.  Abdominal imaging did not show any ileus or obstruction.  Patient was scheduled to see Dr. Alice Reichert as outpatient for EGD however presented to the hospital for intractable nausea and vomiting.  Dr. Marius Ditch is aware of the consult.   Assessment & Plan:   Active Problems:   Intractable nausea and vomiting  Intractable nausea and vomiting with subsequent volume depletion. No vomiting noted since admission Imaging reassuring Per the mother the patient has a history of a bleeding peptic ulcer but this was back in early 2000 Seen by Dr. Alice Reichert as outpatient, is being treated with Protonix, Carafate Plan: Continue n.p.o. Continue IV hydration Antiemetics Protonix Carafate Follow any GI recommendations   Sinus tachycardia. Suspect secondary to volume depletion and dehydration IV fluids as above  GERD. On Protonix and Carafate  Anxiety. As needed Xanax  Acute  anemia Hemoglobin dropped from 14 admission to 9 No obvious sources of bleeding noted Vitals remained stable Suspect that the 14 was due to hemoconcentration Baseline hemoglobin somewhat difficult to ascertain but appears to be closer to 10-11 Monitor for any bleeding Transfuse as needed for active bleeding or hemoglobin less than 7 Twice daily Protonix Hold chemoprophylaxis for now until stability of hemoglobin is noted  DVT prophylaxis: SCDs Code Status: Full Family Communication: Mother at bedside Disposition Plan: Status is: Observation  The patient will require care spanning > 2 midnights and should be moved to inpatient because: Inpatient level of care appropriate due to severity of illness  Dispo: The patient is from: Home              Anticipated d/c is to: Home              Anticipated d/c date is: 2 days              Patient currently is not medically stable to d/c.  Patient has not had any recurrent emesis since admission however has not had any p.o. intake.  Severity of illness at this time supports inpatient status.            Consultants:   GI   Procedures:   None, Possible EGD during this admission  Antimicrobials:  none   Subjective: Seen and examined Mother at bedside Patient nonverbal at baseline  Objective: Vitals:   03/07/20 0206 03/07/20 0611 03/07/20 0740 03/07/20 0946  BP: 113/75 113/65 (!) 93/57 (!) 127/55  Pulse: (!) 121 (!) 115 Marland Kitchen)  115 (!) 115  Resp: 19 16 16 16   Temp: 97.9 F (36.6 C) 98.4 F (36.9 C) 99.4 F (37.4 C) (!) 97.3 F (36.3 C)  TempSrc: Oral Oral Oral Oral  SpO2: 99% 99% 97% 100%  Weight:      Height:       No intake or output data in the 24 hours ending 03/07/20 1113 Filed Weights   03/06/20 1417  Weight: 47.6 kg    Examination:  General exam: Calm, no distress, nonverbal at baseline, chronically ill Respiratory system: Bibasilar crackles, poor inspiratory effort Cardiovascular system: S1-S2 heard,  no murmurs  gastrointestinal system: Obese, nondistended, positive bowel sounds Central nervous system: Unable to assess Extremities: Unable to assess Skin: No rashes, lesions or ulcers Psychiatry: Unable to assess    Data Reviewed: I have personally reviewed following labs and imaging studies  CBC: Recent Labs  Lab 03/02/20 1428 03/06/20 1621 03/07/20 0415  WBC 6.6 13.5* 6.9  HGB 11.3* 14.6 9.4*  HCT 34.9* 46.1* 31.0*  MCV 92.8 92.4 98.7  PLT 206 408* [id number]   Basic Metabolic Panel: Recent Labs  Lab 03/02/20 1428 03/06/20 1621 03/07/20 0613  NA 133* 147* 151*  K 3.8 3.9 2.9*  CL 97* 110 123*  CO2 25 12* 18*  GLUCOSE 97 126* 89  BUN 10 12 7   CREATININE 0.36* 0.90 0.46  CALCIUM 8.6* 10.1 8.0*   GFR: Estimated Creatinine Clearance: 47.7 mL/min (by C-G formula based on SCr of 0.46 mg/dL). Liver Function Tests: Recent Labs  Lab 03/02/20 1428 03/06/20 1621 03/07/20 0613  AST 34 23 17  ALT 27 19 14   ALKPHOS 49 68 39  BILITOT 0.9 1.7* 0.9  PROT 6.1* 7.8 5.3*  ALBUMIN 3.4* 4.3 2.8*   Recent Labs  Lab 03/02/20 1428 03/06/20 1621  LIPASE 21 22   No results for input(s): AMMONIA in the last 168 hours. Coagulation Profile: No results for input(s): INR, PROTIME in the last 168 hours. Cardiac Enzymes: No results for input(s): CKTOTAL, CKMB, CKMBINDEX, TROPONINI in the last 168 hours. BNP (last 3 results) No results for input(s): PROBNP in the last 8760 hours. HbA1C: No results for input(s): HGBA1C in the last 72 hours. CBG: No results for input(s): GLUCAP in the last 168 hours. Lipid Profile: No results for input(s): CHOL, HDL, LDLCALC, TRIG, CHOLHDL, LDLDIRECT in the last 72 hours. Thyroid Function Tests: No results for input(s): TSH, T4TOTAL, FREET4, T3FREE, THYROIDAB in the last 72 hours. Anemia Panel: No results for input(s): VITAMINB12, FOLATE, FERRITIN, TIBC, IRON, RETICCTPCT in the last 72 hours. Sepsis Labs: No results for input(s): PROCALCITON,  LATICACIDVEN in the last 168 hours.  No results found for this or any previous visit (from the past 240 hour(s)).       Radiology Studies: DG Abdomen 1 View  Result Date: 03/06/2020 CLINICAL DATA:  Nausea and vomiting EXAM: ABDOMEN - 1 VIEW COMPARISON:  02/13/2020 FINDINGS: Postsurgical changes are noted in the thoracolumbar spine with stable scoliosis concave to the right at the thoracolumbar junction. Scattered large and small bowel gas is noted. No obstructive changes are seen. No free air is noted. No abnormal mass or abnormal calcifications are seen. IMPRESSION: No acute abnormality noted. Electronically Signed   By: Inez Catalina M.D.   On: 03/06/2020 19:20   US Abdomen Limited RUQ  Result Date: 03/06/2020 CLINICAL DATA:  Vomiting for 1 month EXAM: ULTRASOUND ABDOMEN LIMITED RIGHT UPPER QUADRANT COMPARISON:  03/02/2020 FINDINGS: Gallbladder: No gallstones or wall thickening visualized. No  sonographic Percell Miller sign noted by sonographer. Common bile duct: Diameter: 2 mm (assessment is however markedly limited by patient body habitus. Area felt to represent the common bile duct is measured and is anterior to portal venous structures. These areas are not well evaluated given limitations of the current exam. Liver: Small cysts in the liver are similar to recent CT evaluation. The liver is incompletely assessed due to overlying: Along the RIGHT hepatic lobe, bowel gas in the midline also limiting assessment of midline structures. Portal vein is patent on color Doppler imaging with normal direction of blood flow towards the liver. Again assessment of the porta hepatis is limited by body habitus. Other: No ascites. IMPRESSION: 1. Limited evaluation due to body habitus and overlying bowel gas as described. 2. Within the limitations of the current study no ultrasound evidence of acute cholecystitis. Electronically Signed   By: Zetta Bills M.D.   On: 03/06/2020 17:49        Scheduled Meds: .  doxazosin  2 mg Oral QHS  . enoxaparin (LOVENOX) injection  40 mg Subcutaneous Q24H  . fluticasone  2 spray Each Nare Daily  . loratadine  10 mg Oral Daily  . methenamine  1 g Oral BID WC  . pantoprazole (PROTONIX) IV  40 mg Intravenous Q12H  . sucralfate  1 g Oral TID WC & HS  . traZODone  50 mg Oral QHS   Continuous Infusions: . sodium chloride 100 mL/hr at 03/07/20 0438     LOS: 0 days    Time spent: 35 minutes    Sidney Ace, MD [facility] Pager [phone]  If 7PM-7AM, please contact night-coverage 03/07/2020, 11:13 AM

## 2020-03-07 NOTE — TOC Initial Note (Signed)
Transition of Care [facility]) - Initial/Assessment Note    Patient Details  Name: Loretta Savage MRN: [id number] Date of Birth: [birth year]  Transition of Care [facility]) CM/SW Contact:    Loretta Hashimoto, LCSW Phone Number: 03/07/2020, 10:28 AM  Clinical Narrative:    Pt from home-her Mom is her caregiver-legal guardian. She has been caring for her all of her life. She has aides in am and the evening, pt did go to day program prior to [facility].  Has all equipment at home and Mom knows how to communicate with her and care for her. She just wants to know what is causing her vomiting, has had it for over one month. Will follow in case needs. Provide support to both.          Expected Discharge Plan: Home/Self Care Barriers to Discharge: Continued Medical Work up   Patient Goals and CMS Choice Patient states their goals for this hospitalization and ongoing recovery are:: Mom answers for pt since she is non-speaker. Mom states: " I want to know what is causing this vomiting."      Expected Discharge Plan and Services Expected Discharge Plan: Home/Self Care In-house Referral: Clinical Social Work     Living arrangements for the past 2 months: Single Family Home                                      Prior Living Arrangements/Services Living arrangements for the past 2 months: Single Family Home Lives with:: Parents Patient language and need for interpreter reviewed:: No Do you feel safe going back to the place where you live?: Yes      Need for Family Participation in Patient Care: Yes (Comment) Care giver support system in place?: Yes (comment) Current home services: DME, Homehealth aide(has wc, ceiling lift and bathroom equipment) Criminal Activity/Legal Involvement Pertinent to Current Situation/Hospitalization: No - Comment as needed  Activities of Daily Living Home Assistive Devices/Equipment: Wheelchair, Environmental consultant (specify type)(standing [place] only) ADL Screening (condition at  time of admission) Patient's cognitive ability adequate to safely complete daily activities?: No Is the patient deaf or have difficulty hearing?: No Does the patient have difficulty seeing, even when wearing glasses/contacts?: No Does the patient have difficulty concentrating, remembering, or making decisions?: Yes Patient able to express need for assistance with ADLs?: No Does the patient have difficulty dressing or bathing?: Yes Independently performs ADLs?: No Communication: Dependent Is this a change from baseline?: Pre-admission baseline Dressing (OT): Dependent Is this a change from baseline?: Pre-admission baseline Grooming: Dependent Is this a change from baseline?: Pre-admission baseline Feeding: Dependent Is this a change from baseline?: Pre-admission baseline Bathing: Dependent Is this a change from baseline?: Pre-admission baseline Toileting: Dependent Is this a change from baseline?: Pre-admission baseline In/Out Bed: Dependent Is this a change from baseline?: Pre-admission baseline Walks in Home: Dependent Is this a change from baseline?: Pre-admission baseline Does the patient have difficulty walking or climbing stairs?: Yes Weakness of Legs: None Weakness of Arms/Hands: None  Permission Sought/Granted Permission sought to share information with : Guardian Permission granted to share information with : Yes, Verbal Permission Granted  Share Information with NAME: Loretta Savage     Permission granted to share info w Relationship: Mom-legal guardian     Emotional Assessment Appearance:: Appears stated age Attitude/Demeanor/Rapport: Unable to Assess(non-speaker) Affect (typically observed): Calm Orientation: : Oriented to Self, Oriented to Place Alcohol / Substance Use:  Never Used Psych Involvement: No (comment)  Admission diagnosis:  Vomiting [R11.10] Intractable nausea and vomiting [R11.2] Patient Active Problem List   Diagnosis Date Noted  . Intractable  nausea and vomiting 03/06/2020  . Scoliosis 11/23/2019  . Bowel and bladder incontinence 11/23/2019  . 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 Anxiety 02/04/2018  . Insomnia 02/04/2018  . 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 GERD (gastroesophageal reflux disease) 02/04/2018  . 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 Seasonal allergies 02/04/2018  . Chronic interstitial cystitis 02/04/2018   PCP:  Loretta Hauser, DO Pharmacy:   [id number] - [place], [place] - [address] [address] Phone: [phone] Fax: [phone]     Social Determinants of Health (SDOH) Interventions    Readmission Risk Interventions No flowsheet data found.

## 2020-03-07 NOTE — Progress Notes (Signed)
   03/07/20 0740 03/07/20 0946  Assess: if the MEWS score is Yellow or Red  Were vital signs taken at a resting state? Yes  --   Focused Assessment Documented focused assessment  --   Early Detection of Sepsis Score *See Row Information* Low  --   MEWS guidelines implemented *See Row Information* Yes  --   Treat  MEWS Interventions Other (Comment) (md states baseline mo meds needed)  --   Take Vital Signs  Increase Vital Sign Frequency  Yellow: Q 2hr X 2 then Q 4hr X 2, if remains yellow, continue Q 4hrs  --   Notify: Charge Nurse/RN  Name of Charge Nurse/RN Notified  --  [email or web address]  cooper rn  Date Charge Nurse/RN Notified  --  03/07/20  Time Charge Nurse/RN Notified  --  1000  Notify: Provider  Provider Name/Title  --  Dr Priscella Mann  Date Provider Notified  --  03/07/20  Time Provider Notified  --  0930  Notification Type  --   (notes)  Notification Reason  --  Other (Comment) (just updating)  Response  --  No new orders  Date of Provider Response  --  03/07/20

## 2020-03-07 NOTE — Significant Event (Signed)
Communicated with GI.  Apparently anesthesia did not approve the patient for EGD given her hypokalemia and tachycardia.  Will replace potassium for K of 2.9.  Will likely need further replacement in the morning.  In regards to her tachycardia telemetry reveals sinus tachycardia.  This is likely due to volume depletion as the patient has had intractable nausea and vomiting which is the reason she needs EGD in the first place.  Patient has received 3 L crystalloid bolus in addition to maintenance fluids at 100 cc/h.  Thus far her tachycardia is persistent though is relatively low-grade.  No chest pain.  We will continue maintenance IV fluids for now.  Replace potassium aggressively.  Recheck potassium in the morning.  Tentative plan for EGD on 03/08/2020.  Ralene Muskrat MD

## 2020-03-08 ENCOUNTER — Encounter
Admission: EM | Disposition: A | Discharge status: Hospice | Payer: Self-pay | Source: Home / Self Care | Attending: Internal Medicine

## 2020-03-08 ENCOUNTER — Encounter: Payer: Self-pay | Admitting: Internal Medicine

## 2020-03-08 ENCOUNTER — Inpatient Hospital Stay: Payer: Medicare Other | Admitting: Anesthesiology

## 2020-03-08 ENCOUNTER — Encounter: Admission: RE | Payer: Self-pay | Source: Home / Self Care

## 2020-03-08 ENCOUNTER — Inpatient Hospital Stay: Admission: RE | Admit: 2020-03-08 | Payer: Medicare Other | Source: Home / Self Care | Admitting: Internal Medicine

## 2020-03-08 HISTORY — DX: Unspecified intellectual disabilities: F79

## 2020-03-08 HISTORY — PX: ESOPHAGOGASTRODUODENOSCOPY: SHX5428

## 2020-03-08 HISTORY — DX: Sleep apnea, unspecified: G47.30

## 2020-03-08 LAB — BASIC METABOLIC PANEL
Anion gap: 10 (ref 5–15)
BUN: <5 mg/dL — ABNORMAL LOW (ref 6–20)
CO2: 19 mmol/L — ABNORMAL LOW (ref 22–32)
Calcium: 8.2 mg/dL — ABNORMAL LOW (ref 8.9–10.3)
Chloride: 120 mmol/L — ABNORMAL HIGH (ref 98–111)
Creatinine, Ser: <0.3 mg/dL — ABNORMAL LOW (ref 0.44–1.00)
Glucose, Bld: 60 mg/dL — ABNORMAL LOW (ref 70–99)
Potassium: 3.4 mmol/L — ABNORMAL LOW (ref 3.5–5.1)
Sodium: 149 mmol/L — ABNORMAL HIGH (ref 135–145)

## 2020-03-08 LAB — KOH PREP

## 2020-03-08 LAB — HIV ANTIBODY (ROUTINE TESTING W REFLEX): HIV Screen 4th Generation wRfx: NONREACTIVE

## 2020-03-08 LAB — MAGNESIUM: Magnesium: 1.8 mg/dL (ref 1.7–2.4)

## 2020-03-08 LAB — GLUCOSE, CAPILLARY
Glucose-Capillary: 46 mg/dL — ABNORMAL LOW (ref 70–99)
Glucose-Capillary: 68 mg/dL — ABNORMAL LOW (ref 70–99)
Glucose-Capillary: 167 mg/dL — ABNORMAL HIGH (ref 70–99)

## 2020-03-08 LAB — PHOSPHORUS: Phosphorus: 1.1 mg/dL — ABNORMAL LOW (ref 2.5–4.6)

## 2020-03-08 SURGERY — EGD (ESOPHAGOGASTRODUODENOSCOPY)
Anesthesia: General

## 2020-03-08 SURGERY — ESOPHAGOGASTRODUODENOSCOPY (EGD) WITH PROPOFOL
Anesthesia: General

## 2020-03-08 MED ORDER — DEXTROSE 5 % IV SOLN: INTRAVENOUS | Status: DC

## 2020-03-08 MED ORDER — GLUCOSE 40 % PO GEL
1.0000 | ORAL | Status: AC
  Administered 2020-03-08: 37.5 g via ORAL
  Filled 2020-03-08: qty 1

## 2020-03-08 MED ORDER — POTASSIUM CHLORIDE 10 MEQ/100ML IV SOLN
10.0000 meq | INTRAVENOUS | Status: DC
  Filled 2020-03-08: qty 100

## 2020-03-08 MED ORDER — POTASSIUM PHOSPHATES 15 MMOLE/5ML IV SOLN
30.0000 mmol | Freq: Once | INTRAVENOUS | Status: AC
  Administered 2020-03-08: 30 mmol via INTRAVENOUS
  Filled 2020-03-08: qty 10

## 2020-03-08 MED ORDER — DEXTROSE-NACL 5-0.9 % IV SOLN: INTRAVENOUS | Status: DC

## 2020-03-08 MED ORDER — PROPOFOL 10 MG/ML IV BOLUS
INTRAVENOUS | Status: DC | PRN
  Administered 2020-03-08: 20 mg via INTRAVENOUS

## 2020-03-08 MED ORDER — LIDOCAINE HCL (PF) 2 % IJ SOLN
INTRAMUSCULAR | Status: DC | PRN
  Administered 2020-03-08: 60 mg via INTRADERMAL

## 2020-03-08 MED ORDER — SODIUM CHLORIDE 0.9 % IV SOLN: INTRAVENOUS | Status: DC

## 2020-03-08 MED ORDER — DEXTROSE 10 % IV BOLUS
250.0000 mL | Freq: Once | INTRAVENOUS | Status: AC
  Administered 2020-03-08: 250 mL via INTRAVENOUS

## 2020-03-08 MED ORDER — SODIUM CHLORIDE 0.9 % IV SOLN
200.0000 mg | INTRAVENOUS | Status: AC
  Administered 2020-03-08 – 2020-03-12 (×5): 200 mg via INTRAVENOUS
  Filled 2020-03-08 (×5): qty 10

## 2020-03-08 MED ORDER — PROPOFOL 500 MG/50ML IV EMUL
INTRAVENOUS | Status: DC | PRN
  Administered 2020-03-08: 100 ug/kg/min via INTRAVENOUS

## 2020-03-08 NOTE — Op Note (Addendum)
[facility] Gastroenterology Patient Name: Loretta Savage Procedure Date: 03/08/2020 12:09 PM MRN: [id number] Account #: [id number] Date of Birth: [birth year] Admit Type: Inpatient Age: 54 Room: [facility] ENDO ROOM 4 Gender: Female Note Status: Finalized Procedure:             Upper GI endoscopy Indications:           Iron deficiency anemia secondary to chronic blood                         loss, Coffee-ground emesis, Nausea with vomiting Providers:             Lin Landsman MD, MD Medicines:             Monitored Anesthesia Care Complications:         No immediate complications. Estimated blood loss: None. Procedure:             Pre-Anesthesia Assessment:                        - Prior to the procedure, a History and Physical was                         performed, and patient medications and allergies were                         reviewed. The patient is unable to give consent                         secondary to the patient being legally incompetent to                         consent. The risks and benefits of the procedure and                         the sedation options and risks were discussed with the                         patient's mother. All questions were answered and                         informed consent was obtained. Patient identification                         and proposed procedure were verified by the physician,                         the nurse, the anesthesiologist, the anesthetist and                         the technician in the pre-procedure area in the                         procedure room in the endoscopy suite. Mental Status                         Examination: alert and oriented. Airway Examination:  normal oropharyngeal airway and neck mobility.                         Respiratory Examination: clear to auscultation. CV                         Examination: normal. Prophylactic Antibiotics: The       patient does not require prophylactic antibiotics.                         Prior Anticoagulants: The patient has taken no                         previous anticoagulant or antiplatelet agents. ASA                         Grade Assessment: III - A patient with severe systemic                         disease. After reviewing the risks and benefits, the                         patient was deemed in satisfactory condition to                         undergo the procedure. The anesthesia plan was to use                         monitored anesthesia care (MAC). Immediately prior to                         administration of medications, the patient was                         re-assessed for adequacy to receive sedatives. The                         heart rate, respiratory rate, oxygen saturations,                         blood pressure, adequacy of pulmonary ventilation, and                         response to care were monitored throughout the                         procedure. The physical status of the patient was                         re-assessed after the procedure.                        After obtaining informed consent, the endoscope was                         passed under direct vision. Throughout the procedure,                         the patient's blood pressure,  pulse, and oxygen                         saturations were monitored continuously. The Endoscope                         was introduced through the mouth, and advanced to the                         second part of duodenum. The upper GI endoscopy was                         accomplished without difficulty. The patient tolerated                         the procedure well. Findings:      The duodenal bulb and second portion of the duodenum were normal.      A medium-sized hiatal hernia was present.      The entire examined stomach was normal.      LA Grade D (one or more mucosal breaks involving at least 75% of        esophageal circumference) esophagitis with bleeding was found in the       lower third of the esophagus.      White nummular lesions were noted in the middle third of the esophagus.       Cells for cytology were obtained by brushing.      Esophagogastric landmarks were identified: the gastroesophageal junction       was found at 30 cm from the incisors. Impression:            - Normal duodenal bulb and second portion of the                         duodenum.                        - Medium-sized hiatal hernia.                        - Normal stomach.                        - LA Grade D reflux esophagitis with bleeding.                        - White nummular lesions in esophageal mucosa. Cells                         for cytology obtained.                        - Esophagogastric landmarks identified. Recommendation:        - Return patient to hospital ward for possible                         discharge same day.                        - Resume previous diet today.                        -  Continue present medications.                        - Follow an antireflux regimen today.                        - Use Prilosec (omeprazole) 40 mg PO BID for 2 months.                        - Repeat upper endoscopy in 2 months to check healing.                        - Return to GI office in 1 month with Dr Alice Reichert. Procedure Code(s):     --- Professional ---                        [id number], Esophagogastroduodenoscopy, flexible,                         transoral; diagnostic, including collection of                         specimen(s) by brushing or washing, when performed                         (separate procedure) Diagnosis Code(s):     --- Professional ---                        K44.9, Diaphragmatic hernia without obstruction or                         gangrene                        K21.01                        K22.8, Other specified diseases of esophagus                        D50.0, Iron  deficiency anemia secondary to blood loss                         (chronic)                        K92.0, Hematemesis                        R11.2, Nausea with vomiting, unspecified CPT copyright 2019 American Medical Association. All rights reserved. The codes documented in this report are preliminary and upon coder review may  be revised to meet current compliance requirements. Dr. Ulyess Mort Lin Landsman MD, MD 03/08/2020 12:38:46 PM This report has been signed electronically. Number of Addenda: 0 Note Initiated On: 03/08/2020 12:09 PM Estimated Blood Loss:  Estimated blood loss: none.      [facility]

## 2020-03-08 NOTE — Progress Notes (Signed)
Pt left for  Endoscopy for egd.  bs low this am meds given as ordered and bolus d10 with ivf change of d5ns/ k low and kphos iv hung.   Nonverbal  Makes  Sounds at times. Mother at bedside.

## 2020-03-08 NOTE — Progress Notes (Signed)
[place] paged to 1A to provide Bible for pt.'s mother/guardian.  Pt. lying in bed asleep when [facility] arrived; mother in chair @ bedside working on word search.  Mother grateful for [facility] visit; requested large-print Bible and devotional reading.  Mother requested prayer, but Pt.'s RN was administering meds during [facility] visit --> [facility] will attempt visit later if possible.    03/07/20 1400  Clinical Encounter Type  Visited With Patient and family together  Visit Type Initial;Psychological support;Spiritual support;Social support  Referral From Nurse  Spiritual Encounters  Spiritual Needs Sacred text;Ritual;Prayer  Stress Factors  Family Stress Factors Loss of control;Health changes;Major life changes

## 2020-03-08 NOTE — Anesthesia Postprocedure Evaluation (Signed)
Anesthesia Post Note  Patient: Loretta Savage  Procedure(s) Performed: ESOPHAGOGASTRODUODENOSCOPY (EGD) (N/A )  Patient location during evaluation: Endoscopy Anesthesia Type: General Level of consciousness: awake and alert Pain management: pain level controlled Vital Signs Assessment: post-procedure vital signs reviewed and stable Respiratory status: spontaneous breathing, nonlabored ventilation, respiratory function stable and patient connected to nasal cannula oxygen Cardiovascular status: blood pressure returned to baseline and stable Postop Assessment: no apparent nausea or vomiting Anesthetic complications: no     Last Vitals:  Vitals:   03/08/20 1258 03/08/20 1327  BP: 90/62 111/70  Pulse: 100 (!) 101  Resp: (!) 22 17  Temp:    SpO2:  100%    Last Pain:  Vitals:   03/08/20 1238  TempSrc: Temporal  PainSc: Asleep                 Precious Haws Tayen Narang

## 2020-03-08 NOTE — Anesthesia Preprocedure Evaluation (Addendum)
Anesthesia Evaluation  Patient identified by MRN, date of birth, ID band Patient awake and Patient confused    Reviewed: Allergy & Precautions, H&P , NPO status , Patient's Chart, lab work & pertinent test results  History of Anesthesia Complications Negative for: history of anesthetic complications  Airway Mallampati: III  TM Distance: <3 FB Neck ROM: limited    Dental  (+) Chipped, Poor Dentition   Pulmonary neg shortness of breath, sleep apnea ,    Pulmonary exam normal        Cardiovascular (-) Past MI negative cardio ROS Normal cardiovascular exam     Neuro/Psych PSYCHIATRIC DISORDERS negative neurological ROS     GI/Hepatic Neg liver ROS, GERD  ,  Endo/Other  negative endocrine ROS  Renal/GU Renal disease  negative genitourinary   Musculoskeletal  (+) Arthritis ,   Abdominal   Peds  Hematology negative hematology ROS (+)   Anesthesia Other Findings Mother reports that the patient is NPO appropriate and that the patient has not had any nausea or vomiting today or yesteday  Past Medical History: No date: Anxiety No date: Cerebral palsy (HCC) No date: Chronic kidney disease     Comment:  stone No date: Colon polyp No date: GERD (gastroesophageal reflux disease) No date: Mentally challenged No date: Sleep apnea No date: Urinary incontinence  Past Surgical History: 2006: ABDOMINAL HYSTERECTOMY 03/1991: BACK SURGERY 2004: HIP ADDUCTOR TENOTOMY 1992: REPAIR QUADRICEPS / HAMSTRING MUSCLE No date: SIGMOIDOSCOPY  BMI    Body Mass Index: 26.28 kg/m      Reproductive/Obstetrics negative OB ROS                            Anesthesia Physical Anesthesia Plan  ASA: III  Anesthesia Plan: General   Post-op Pain Management:    Induction: Intravenous  PONV Risk Score and Plan: Propofol infusion and TIVA  Airway Management Planned: Natural Airway and Nasal  Cannula  Additional Equipment:   Intra-op Plan:   Post-operative Plan:   Informed Consent: I have reviewed the patients History and Physical, chart, labs and discussed the procedure including the risks, benefits and alternatives for the proposed anesthesia with the patient or authorized representative who has indicated his/her understanding and acceptance.     Dental Advisory Given  Plan Discussed with: Anesthesiologist, CRNA and Surgeon  Anesthesia Plan Comments: (History and consent from the patients mother Raylynn Meuser   Mother consented for risks of anesthesia including but not limited to:  - adverse reactions to medications - risk of intubation if required - damage to eyes, teeth, lips or other oral mucosa - nerve damage due to positioning  - sore throat or hoarseness - Damage to heart, brain, nerves, lungs or loss of life  Mother voiced understanding.)      Anesthesia Quick Evaluation

## 2020-03-08 NOTE — Transfer of Care (Signed)
Immediate Anesthesia Transfer of Care Note  Patient: Loretta Savage  Procedure(s) Performed: ESOPHAGOGASTRODUODENOSCOPY (EGD) (N/A )  Patient Location: PACU  Anesthesia Type:General  Level of Consciousness: sedated  Airway & Oxygen Therapy: Patient Spontanous Breathing and Patient connected to nasal cannula oxygen  Post-op Assessment: Report given to RN and Post -op Vital signs reviewed and stable  Post vital signs: Reviewed and stable  Last Vitals:  Vitals Value Taken Time  BP    Temp    Pulse    Resp    SpO2      Last Pain:  Vitals:   03/08/20 1238  TempSrc: (P) Temporal         Complications: No apparent anesthesia complications

## 2020-03-08 NOTE — Anesthesia Procedure Notes (Signed)
Date/Time: 03/08/2020 12:31 PM Performed by: Nelda Marseille, CRNA Pre-anesthesia Checklist: Patient identified, Emergency Drugs available, Suction available, Patient being monitored and Timeout performed Oxygen Delivery Method: Nasal cannula

## 2020-03-08 NOTE — Progress Notes (Signed)
PROGRESS NOTE    Loretta Savage  [id number] DOB: 09/05/1966 DOA: 03/06/2020 PCP: Olin Hauser, DO   Brief Narrative:  54 y.o. Caucasian female with a known history of anxiety, GERD and cerebral palsy, who presented to the emergency room with acute onset of intractable nausea and vomiting which has been intermittent over the last few weeks since 4/18.  The patient was scheduled to have an EGD by Dr. Alice Reichert on Wednesday.  Her mother and caregiver believes that she may have had mild abdominal discomfort.  She has been coughing from 02/04/2020 when she was diagnosed with COVID-19.  She did not have any dyspnea or wheezing.  She did not have any bowel movement since Wednesday night and has been taking laxatives.  No chest pain or palpitations.  All history was obtained from the patient's mother as the patient was nonverbal close to her baseline.  5/12: Patient seen and examined.  Patient's mother is at bedside.  Patient remains nonverbal at baseline.  Per the mother the patient has not been vomiting since admission.  Abdominal imaging did not show any ileus or obstruction.  Patient was scheduled to see Dr. Alice Reichert as outpatient for EGD however presented to the hospital for intractable nausea and vomiting.  Dr. Marius Ditch is aware of the consult.   Assessment & Plan:   Active Problems:   Intractable nausea and vomiting  Intractable nausea and vomiting with subsequent volume depletion. No vomiting noted since admission Imaging reassuring Per the mother the patient has a history of a bleeding peptic ulcer but this was back in early 2000 Seen by Dr. Alice Reichert as outpatient, is being treated with Protonix, Carafate Plan: Continue n.p.o. Continue IV hydration Antiemetics Protonix Carafate GI following, plan for EGD today  Sinus tachycardia. Suspect secondary to volume depletion and dehydration IV fluids as above  GERD. On Protonix and Carafate  Anxiety. As needed Xanax  Acute  anemia, unknown cause, Hb 14 on admission could be secondary to hemoconcentration  Hemoglobin dropped from 14 admission to 9 Iron deficiency, folate and B12 within normal range No obvious sources of bleeding noted Vitals remained stable Suspect that the 14 was due to hemoconcentration Baseline hemoglobin somewhat difficult to ascertain but appears to be closer to 10-11 Monitor for any bleeding Transfuse as needed for active bleeding or hemoglobin less than 7 Twice daily Protonix Hold chemoprophylaxis for now until stability of hemoglobin is noted  Iron deficiency, iron saturation 9% 5/12 started Venofer 200 mg IV daily for 5 days Start oral iron supplement with vitamin C on discharge  Hypoglycemia Low blood glucose secondary to poor oral intake 5/12 started D5 IV infusion for overnight, continue to monitor CBG Follow hypoglycemia protocol  Hypophosphatemia secondary to decreased oral intake Phosphorus repleted.  Monitor and replete as needed    DVT prophylaxis: SCDs Code Status: Full Family Communication: Mother at bedside Disposition Plan: Status is: Inpatient  The patient will require care spanning > 2 midnights and should be moved to inpatient because: Inpatient level of care appropriate due to severity of illness  Dispo: The patient is from: Home              Anticipated d/c is to: Home              Anticipated d/c date is: 2 days              Patient currently is not medically stable to d/c.  Patient has not had any recurrent emesis since admission  however has not had any p.o. intake.  Severity of illness at this time supports inpatient status.  Disposition Plan: 5/12 patient is hyperglycemic secondary to poor oral intake, started D5 IV and noticed hypophosphatemia which was supplemented.  GI plan for EGD today. Disposition in 1 to 2 days, currently patient is not a stable to discharge      Consultants:   GI   Procedures:   EGD 5/12    Antimicrobials:  none   Subjective: Seen and examined at bedside, remained without any distress Mother at bedside Patient nonverbal at baseline  Objective: Vitals:   03/07/20 2319 03/08/20 0501 03/08/20 0757 03/08/20 1148  BP: (!) 129/54 126/66 (!) 114/57 (!) 111/94  Pulse: 99 94 90 (!) 101  Resp: 16 16  (!) 22  Temp: 98.8 F (37.1 C) 98.7 F (37.1 C) 98.6 F (37 C) (!) 97.5 F (36.4 C)  TempSrc: Oral Oral Oral Temporal  SpO2: 97% 100% 100% 97%  Weight:      Height:        Intake/Output Summary (Last 24 hours) at 03/08/2020 1230 Last data filed at 03/08/2020 0400 Gross per 24 hour  Intake 2556.02 ml  Output --  Net 2556.02 ml   Filed Weights   03/06/20 1417  Weight: 47.6 kg    Examination:  General exam: Calm, no distress, nonverbal at baseline, chronically ill Respiratory system: Bibasilar crackles, poor inspiratory effort Cardiovascular system: S1-S2 heard, no murmurs  gastrointestinal system: Obese, nondistended, positive bowel sounds Central nervous system: Unable to assess Extremities: Unable to assess Skin: No rashes, lesions or ulcers Psychiatry: Unable to assess    Data Reviewed: I have personally reviewed following labs and imaging studies  CBC: Recent Labs  Lab 03/02/20 1428 03/06/20 1621 03/07/20 0415  WBC 6.6 13.5* 6.9  HGB 11.3* 14.6 9.4*  HCT 34.9* 46.1* 31.0*  MCV 92.8 92.4 98.7  PLT 206 408* [id number]   Basic Metabolic Panel: Recent Labs  Lab 03/02/20 1428 03/06/20 1621 03/07/20 0613 03/08/20 0536  NA 133* 147* 151* 149*  K 3.8 3.9 2.9* 3.4*  CL 97* 110 123* 120*  CO2 25 12* 18* 19*  GLUCOSE 97 126* 89 60*  BUN 10 12 7  <5*  CREATININE 0.36* 0.90 0.46 <0.30*  CALCIUM 8.6* 10.1 8.0* 8.2*  MG  --   --   --  1.8  PHOS  --   --   --  1.1*   GFR: CrCl cannot be calculated (This lab value cannot be used to calculate CrCl because it is not a number: <0.30). Liver Function Tests: Recent Labs  Lab 03/02/20 1428 03/06/20 1621  03/07/20 0613  AST 34 23 17  ALT 27 19 14   ALKPHOS 49 68 39  BILITOT 0.9 1.7* 0.9  PROT 6.1* 7.8 5.3*  ALBUMIN 3.4* 4.3 2.8*   Recent Labs  Lab 03/02/20 1428 03/06/20 1621  LIPASE 21 22   No results for input(s): AMMONIA in the last 168 hours. Coagulation Profile: No results for input(s): INR, PROTIME in the last 168 hours. Cardiac Enzymes: No results for input(s): CKTOTAL, CKMB, CKMBINDEX, TROPONINI in the last 168 hours. BNP (last 3 results) No results for input(s): PROBNP in the last 8760 hours. HbA1C: No results for input(s): HGBA1C in the last 72 hours. CBG: Recent Labs  Lab 03/08/20 0825 03/08/20 0853 03/08/20 1137  GLUCAP 46* 68* 167*   Lipid Profile: No results for input(s): CHOL, HDL, LDLCALC, TRIG, CHOLHDL, LDLDIRECT in the last 72 hours. Thyroid  Function Tests: No results for input(s): TSH, T4TOTAL, FREET4, T3FREE, THYROIDAB in the last 72 hours. Anemia Panel: Recent Labs    03/07/20 1652 03/07/20 1730  VITAMINB12 2,738*  --   FOLATE  --  21.6  FERRITIN 13  --   TIBC 318  --   IRON 28  --    Sepsis Labs: No results for input(s): PROCALCITON, LATICACIDVEN in the last 168 hours.  No results found for this or any previous visit (from the past 240 hour(s)).       Radiology Studies: DG Abdomen 1 View  Result Date: 03/06/2020 CLINICAL DATA:  Nausea and vomiting EXAM: ABDOMEN - 1 VIEW COMPARISON:  02/13/2020 FINDINGS: Postsurgical changes are noted in the thoracolumbar spine with stable scoliosis concave to the right at the thoracolumbar junction. Scattered large and small bowel gas is noted. No obstructive changes are seen. No free air is noted. No abnormal mass or abnormal calcifications are seen. IMPRESSION: No acute abnormality noted. Electronically Signed   By: Inez Catalina M.D.   On: 03/06/2020 19:20   US Abdomen Limited RUQ  Result Date: 03/06/2020 CLINICAL DATA:  Vomiting for 1 month EXAM: ULTRASOUND ABDOMEN LIMITED RIGHT UPPER QUADRANT  COMPARISON:  03/02/2020 FINDINGS: Gallbladder: No gallstones or wall thickening visualized. No sonographic Murphy sign noted by sonographer. Common bile duct: Diameter: 2 mm (assessment is however markedly limited by patient body habitus. Area felt to represent the common bile duct is measured and is anterior to portal venous structures. These areas are not well evaluated given limitations of the current exam. Liver: Small cysts in the liver are similar to recent CT evaluation. The liver is incompletely assessed due to overlying: Along the RIGHT hepatic lobe, bowel gas in the midline also limiting assessment of midline structures. Portal vein is patent on color Doppler imaging with normal direction of blood flow towards the liver. Again assessment of the porta hepatis is limited by body habitus. Other: No ascites. IMPRESSION: 1. Limited evaluation due to body habitus and overlying bowel gas as described. 2. Within the limitations of the current study no ultrasound evidence of acute cholecystitis. Electronically Signed   By: Zetta Bills M.D.   On: 03/06/2020 17:49        Scheduled Meds: . [MAR Hold] doxazosin  2 mg Oral QHS  . [MAR Hold] fluticasone  2 spray Each Nare Daily  . [MAR Hold] loratadine  10 mg Oral Daily  . [MAR Hold] methenamine  1 g Oral BID WC  . [MAR Hold] pantoprazole (PROTONIX) IV  40 mg Intravenous Q12H  . [MAR Hold] sucralfate  1 g Oral TID WC & HS  . [MAR Hold] traZODone  50 mg Oral QHS   Continuous Infusions: . sodium chloride    . dextrose 5 % and 0.9% NaCl 100 mL/hr at 03/08/20 1034  . [MAR Hold] iron sucrose 200 mg (03/08/20 0847)  . potassium PHOSPHATE IVPB (in mmol) 30 mmol (03/08/20 1041)     LOS: 1 day    Time spent: 35 minutes    Val Riles, MD [facility] Pager [phone]   If 7PM-7AM, please contact night-coverage 03/08/2020, 12:30 PM

## 2020-03-09 ENCOUNTER — Encounter: Payer: Self-pay | Admitting: *Deleted

## 2020-03-09 LAB — BASIC METABOLIC PANEL
Anion gap: 8 (ref 5–15)
BUN: <5 mg/dL — ABNORMAL LOW (ref 6–20)
CO2: 24 mmol/L (ref 22–32)
Calcium: 8.3 mg/dL — ABNORMAL LOW (ref 8.9–10.3)
Chloride: 110 mmol/L (ref 98–111)
Creatinine, Ser: 0.3 mg/dL — ABNORMAL LOW (ref 0.44–1.00)
GFR calc Af Amer: >60 mL/min (ref >60)
GFR calc non Af Amer: >60 mL/min (ref >60)
Glucose, Bld: 96 mg/dL (ref 70–99)
Potassium: 3.1 mmol/L — ABNORMAL LOW (ref 3.5–5.1)
Sodium: 142 mmol/L (ref 135–145)

## 2020-03-09 LAB — CBC
HCT: 32 % — ABNORMAL LOW (ref 36.0–46.0)
Hemoglobin: 10.2 g/dL — ABNORMAL LOW (ref 12.0–15.0)
MCH: 29.4 pg (ref 26.0–34.0)
MCHC: 31.9 g/dL (ref 30.0–36.0)
MCV: 92.2 fL (ref 80.0–100.0)
Platelets: 139 10*3/uL — ABNORMAL LOW (ref 150–400)
RBC: 3.47 MIL/uL — ABNORMAL LOW (ref 3.87–5.11)
RDW: 14.6 % (ref 11.5–15.5)
WBC: 4.5 10*3/uL (ref 4.0–10.5)
nRBC: 0 % (ref 0.0–0.2)

## 2020-03-09 LAB — PHOSPHORUS: Phosphorus: 2.2 mg/dL — ABNORMAL LOW (ref 2.5–4.6)

## 2020-03-09 LAB — MAGNESIUM: Magnesium: 1.5 mg/dL — ABNORMAL LOW (ref 1.7–2.4)

## 2020-03-09 MED ORDER — POTASSIUM PHOSPHATES 15 MMOLE/5ML IV SOLN
30.0000 mmol | Freq: Once | INTRAVENOUS | Status: AC
  Administered 2020-03-09: 30 mmol via INTRAVENOUS
  Filled 2020-03-09: qty 10

## 2020-03-09 MED ORDER — DEXTROSE-NACL 5-0.45 % IV SOLN: INTRAVENOUS | Status: DC

## 2020-03-09 MED ORDER — MAGNESIUM SULFATE 2 GM/50ML IV SOLN
2.0000 g | Freq: Once | INTRAVENOUS | Status: AC
  Administered 2020-03-09: 2 g via INTRAVENOUS
  Filled 2020-03-09: qty 50

## 2020-03-09 MED ORDER — FLUCONAZOLE IN SODIUM CHLORIDE 200-0.9 MG/100ML-% IV SOLN
200.0000 mg | INTRAVENOUS | Status: DC
  Administered 2020-03-09 – 2020-03-19 (×10): 200 mg via INTRAVENOUS
  Filled 2020-03-09 (×10): qty 100

## 2020-03-09 MED ORDER — ACETAMINOPHEN 10 MG/ML IV SOLN
1000.0000 mg | Freq: Once | INTRAVENOUS | Status: AC
  Administered 2020-03-09: 1000 mg via INTRAVENOUS
  Filled 2020-03-09: qty 100

## 2020-03-09 NOTE — Progress Notes (Signed)
Patient mews turned yelloe. Patient assessed HR 105 lungs clear. Patient mother is in the room. Patient is easily agitated with any care or conversation given to her.After assessment patient MEWs is now green

## 2020-03-09 NOTE — Progress Notes (Signed)
PROGRESS NOTE    Loretta Savage  [id number] DOB: 1966/02/06 DOA: 03/06/2020 PCP: Olin Hauser, DO   Brief Narrative:  54 y.o. Caucasian female with a known history of anxiety, GERD and cerebral palsy, who presented to the emergency room with acute onset of intractable nausea and vomiting which has been intermittent over the last few weeks since 4/18.  The patient was scheduled to have an EGD by Dr. Alice Reichert on Wednesday.  Her mother and caregiver believes that she may have had mild abdominal discomfort.  She has been coughing from 02/04/2020 when she was diagnosed with COVID-19.  She did not have any dyspnea or wheezing.  She did not have any bowel movement since Wednesday night and has been taking laxatives.  No chest pain or palpitations.  All history was obtained from the patient's mother as the patient was nonverbal close to her baseline.  5/13: Patient seen and examined.  Patient's mother is at bedside.  Patient remains nonverbal at baseline.  Per the mother the patient has not been vomiting since admission.  Patient seems to be morning secondary to pain.  Patient is not taking anything orally.   Assessment & Plan:   Active Problems:   Intractable nausea and vomiting  Intractable nausea and vomiting with subsequent volume depletion. No vomiting noted since admission Imaging reassuring Per the mother the patient has a history of a bleeding peptic ulcer but this was back in early 2000 Seen by Dr. Alice Reichert as outpatient, is being treated with Protonix, Carafate Plan: Continue IV hydration, resume diet Antiemetics Protonix Carafate GI following, s/p EGD done on 5/12: Impression:LA Grade D reflux esophagitis with bleeding. White nummular lesions in esophageal mucosa. Cells for cytology obtained.  Use Prilosec (omeprazole) 40 mg PO BID for 2 months. - Repeat upper endoscopy in 2 months to check healing. - Return to GI office in 1 month with Dr Alice Reichert. Biopsy shows budding  yeast  Esophageal candidiasis, biopsy showed budding yeast Started Diflucan 20 mg IV daily, will switch to oral once stable.   Sinus tachycardia. Suspect secondary to volume depletion and dehydration IV fluids as above  GERD. On Protonix and Carafate  Anxiety. As needed Xanax  Acute anemia, unknown cause, Hb 14 on admission could be secondary to hemoconcentration  Hemoglobin dropped from 14 admission to 9 Iron deficiency, folate and B12 within normal range No obvious sources of bleeding noted Vitals remained stable Suspect that the 14 was due to hemoconcentration Baseline hemoglobin somewhat difficult to ascertain but appears to be closer to 10-11 Monitor for any bleeding Transfuse as needed for active bleeding or hemoglobin less than 7 Twice daily Protonix Hold chemoprophylaxis for now until stability of hemoglobin is noted  Iron deficiency, iron saturation 9% 5/12 started Venofer 200 mg IV daily for 5 days Start oral iron supplement with vitamin C on discharge  Hypoglycemia Low blood glucose secondary to poor oral intake 5/13 changed to D5 half NS for overnight  Follow hypoglycemia protocol  Hypophosphatemia secondary to decreased oral intake Phosphorus repleted.  Monitor and replete as needed  Hypokalemia: Potassium repleted.  Continue to monitor.  Hypomagnesemia, magnesium repleted.  Continue to monitor.    DVT prophylaxis: SCDs Code Status: Full Family Communication: Mother at bedside Disposition Plan: Status is: Inpatient  The patient will require care spanning > 2 midnights and should be moved to inpatient because: Inpatient level of care appropriate due to severity of illness  Dispo: The patient is from: Home  Anticipated d/c is to: Home              Anticipated d/c date is: 2 days              Patient currently is not medically stable to d/c.  Patient has not had any recurrent emesis since admission however has not had any p.o. intake.   Severity of illness at this time supports inpatient status.  Disposition Plan: 5/13 still patient has poor oral intake, D5 0.45 NS started, hypokalemia, hypophosphatemia and hypomagnesemia, all electrolytes repleted.  We will reassess tomorrow for disposition.    Consultants:   GI   Procedures:   EGD 5/12   Antimicrobials:  none   Subjective: Seen and examined at bedside, patient was moaning, seems to be in pain. Mother at bedside, stated that patient is not eating anything today, possible secondary to pain Patient nonverbal at baseline  Objective: Vitals:   03/08/20 1258 03/08/20 1327 03/08/20 1546 03/08/20 2343  BP: 90/62 111/70 (!) 102/57 (!) 114/53  Pulse: 100 (!) 101 (!) 105 96  Resp: (!) 22 17  20   Temp:   98.5 F (36.9 C) 98.6 F (37 C)  TempSrc:   Oral Oral  SpO2:  100% 97% 100%  Weight:      Height:        Intake/Output Summary (Last 24 hours) at 03/09/2020 0730 Last data filed at 03/09/2020 0047 Gross per 24 hour  Intake 1053.27 ml  Output --  Net 1053.27 ml   Filed Weights   03/06/20 1417  Weight: 47.6 kg    Examination:  General exam: Calm, no distress, nonverbal at baseline, chronically ill Respiratory system: Bibasilar crackles, poor inspiratory effort Cardiovascular system: S1-S2 heard, no murmurs  gastrointestinal system: Obese, nondistended, positive bowel sounds Central nervous system: Unable to assess Extremities: Unable to assess Skin: No rashes, lesions or ulcers Psychiatry: Unable to assess    Data Reviewed: I have personally reviewed following labs and imaging studies  CBC: Recent Labs  Lab 03/02/20 1428 03/06/20 1621 03/07/20 0415 03/09/20 0654  WBC 6.6 13.5* 6.9 4.5  HGB 11.3* 14.6 9.4* 10.2*  HCT 34.9* 46.1* 31.0* 32.0*  MCV 92.8 92.4 98.7 92.2  PLT 206 408* 251 [id number]*   Basic Metabolic Panel: Recent Labs  Lab 03/02/20 1428 03/06/20 1621 03/07/20 0613 03/08/20 0536 03/09/20 0628  NA 133* 147* 151* 149* 142   K 3.8 3.9 2.9* 3.4* 3.1*  CL 97* 110 123* 120* 110  CO2 25 12* 18* 19* 24  GLUCOSE 97 126* 89 60* 96  BUN 10 12 7  <5* <5*  CREATININE 0.36* 0.90 0.46 <0.30* 0.30*  CALCIUM 8.6* 10.1 8.0* 8.2* 8.3*  MG  --   --   --  1.8 1.5*  PHOS  --   --   --  1.1* 2.2*   GFR: Estimated Creatinine Clearance: 47.7 mL/min (A) (by C-G formula based on SCr of 0.3 mg/dL (L)). Liver Function Tests: Recent Labs  Lab 03/02/20 1428 03/06/20 1621 03/07/20 0613  AST 34 23 17  ALT 27 19 14   ALKPHOS 49 68 39  BILITOT 0.9 1.7* 0.9  PROT 6.1* 7.8 5.3*  ALBUMIN 3.4* 4.3 2.8*   Recent Labs  Lab 03/02/20 1428 03/06/20 1621  LIPASE 21 22   No results for input(s): AMMONIA in the last 168 hours. Coagulation Profile: No results for input(s): INR, PROTIME in the last 168 hours. Cardiac Enzymes: No results for input(s): CKTOTAL, CKMB, CKMBINDEX, TROPONINI in the  last 168 hours. BNP (last 3 results) No results for input(s): PROBNP in the last 8760 hours. HbA1C: No results for input(s): HGBA1C in the last 72 hours. CBG: Recent Labs  Lab 03/08/20 0825 03/08/20 0853 03/08/20 1137  GLUCAP 46* 68* 167*   Lipid Profile: No results for input(s): CHOL, HDL, LDLCALC, TRIG, CHOLHDL, LDLDIRECT in the last 72 hours. Thyroid Function Tests: No results for input(s): TSH, T4TOTAL, FREET4, T3FREE, THYROIDAB in the last 72 hours. Anemia Panel: Recent Labs    03/07/20 1652 03/07/20 1730  VITAMINB12 2,738*  --   FOLATE  --  21.6  FERRITIN 13  --   TIBC 318  --   IRON 28  --    Sepsis Labs: No results for input(s): PROCALCITON, LATICACIDVEN in the last 168 hours.  Recent Results (from the past 240 hour(s))  KOH prep     Status: None   Collection Time: 03/08/20 11:30 AM   Specimen: Esophagus  Result Value Ref Range Status   Specimen Description ESOPHAGUS  Final   Special Requests NONE  Final   KOH Prep   Final    BUDDING YEAST SEEN Performed at [facility], [address]  [place], [place] [address]    Report Status 03/08/2020 FINAL  Final         Radiology Studies: No results found.      Scheduled Meds: . doxazosin  2 mg Oral QHS  . fluticasone  2 spray Each Nare Daily  . loratadine  10 mg Oral Daily  . methenamine  1 g Oral BID WC  . pantoprazole (PROTONIX) IV  40 mg Intravenous Q12H  . sucralfate  1 g Oral TID WC & HS  . traZODone  50 mg Oral QHS   Continuous Infusions: . dextrose 75 mL/hr at 03/09/20 0504  . iron sucrose Stopped (03/08/20 0901)  . magnesium sulfate bolus IVPB    . potassium PHOSPHATE IVPB (in mmol)       LOS: 2 days    Time spent: 35 minutes    Val Riles, MD [facility] Pager [phone]   If 7PM-7AM, please contact night-coverage 03/09/2020, 7:30 AM

## 2020-03-09 NOTE — Progress Notes (Signed)
   03/09/20 2236  Vitals  ECG Heart Rate (!) 105  Oxygen Therapy  SpO2 100 %  O2 Device Room Air  PCA/Epidural/Spinal Assessment  Respiratory Pattern Regular;Unlabored  MEWS Score  MEWS Temp 0  MEWS Systolic 0  MEWS Pulse 1  MEWS RR 0  MEWS LOC 0  MEWS Score 1  MEWS Score Color Green

## 2020-03-10 LAB — BASIC METABOLIC PANEL
Anion gap: 6 (ref 5–15)
Anion gap: 7 (ref 5–15)
BUN: <5 mg/dL — ABNORMAL LOW (ref 6–20)
BUN: <5 mg/dL — ABNORMAL LOW (ref 6–20)
CO2: 27 mmol/L (ref 22–32)
CO2: 27 mmol/L (ref 22–32)
Calcium: 8.2 mg/dL — ABNORMAL LOW (ref 8.9–10.3)
Calcium: 8.4 mg/dL — ABNORMAL LOW (ref 8.9–10.3)
Chloride: 105 mmol/L (ref 98–111)
Chloride: 105 mmol/L (ref 98–111)
Creatinine, Ser: 0.37 mg/dL — ABNORMAL LOW (ref 0.44–1.00)
Creatinine, Ser: 0.43 mg/dL — ABNORMAL LOW (ref 0.44–1.00)
GFR calc Af Amer: >60 mL/min (ref >60)
GFR calc Af Amer: >60 mL/min (ref >60)
GFR calc non Af Amer: >60 mL/min (ref >60)
GFR calc non Af Amer: >60 mL/min (ref >60)
Glucose, Bld: 109 mg/dL — ABNORMAL HIGH (ref 70–99)
Glucose, Bld: 129 mg/dL — ABNORMAL HIGH (ref 70–99)
Potassium: 3.1 mmol/L — ABNORMAL LOW (ref 3.5–5.1)
Potassium: 4.1 mmol/L (ref 3.5–5.1)
Sodium: 138 mmol/L (ref 135–145)
Sodium: 139 mmol/L (ref 135–145)

## 2020-03-10 LAB — CBC
HCT: 31.4 % — ABNORMAL LOW (ref 36.0–46.0)
Hemoglobin: 10.4 g/dL — ABNORMAL LOW (ref 12.0–15.0)
MCH: 30.3 pg (ref 26.0–34.0)
MCHC: 33.1 g/dL (ref 30.0–36.0)
MCV: 91.5 fL (ref 80.0–100.0)
Platelets: 265 10*3/uL (ref 150–400)
RBC: 3.43 MIL/uL — ABNORMAL LOW (ref 3.87–5.11)
RDW: 14.6 % (ref 11.5–15.5)
WBC: 4.3 10*3/uL (ref 4.0–10.5)
nRBC: 0.5 % — ABNORMAL HIGH (ref 0.0–0.2)

## 2020-03-10 LAB — PHOSPHORUS
Phosphorus: 2 mg/dL — ABNORMAL LOW (ref 2.5–4.6)
Phosphorus: 4.3 mg/dL (ref 2.5–4.6)

## 2020-03-10 LAB — MAGNESIUM: Magnesium: 1.9 mg/dL (ref 1.7–2.4)

## 2020-03-10 MED ORDER — HYDROMORPHONE HCL 1 MG/ML IJ SOLN
1.0000 mg | Freq: Once | INTRAMUSCULAR | Status: AC
  Administered 2020-03-10: 1 mg via INTRAVENOUS
  Filled 2020-03-10: qty 1

## 2020-03-10 MED ORDER — ENOXAPARIN SODIUM 40 MG/0.4ML ~~LOC~~ SOLN
40.0000 mg | SUBCUTANEOUS | Status: DC
  Administered 2020-03-10 – 2020-03-16 (×7): 40 mg via SUBCUTANEOUS
  Filled 2020-03-10 (×7): qty 0.4

## 2020-03-10 MED ORDER — KCL IN DEXTROSE-NACL 40-5-0.9 MEQ/L-%-% IV SOLN
INTRAVENOUS | Status: DC
  Filled 2020-03-10 (×7): qty 1000

## 2020-03-10 MED ORDER — POTASSIUM PHOSPHATES 15 MMOLE/5ML IV SOLN
30.0000 mmol | Freq: Once | INTRAVENOUS | Status: AC
  Administered 2020-03-10: 30 mmol via INTRAVENOUS
  Filled 2020-03-10: qty 10

## 2020-03-10 MED ORDER — SODIUM CHLORIDE 0.9 % IV BOLUS
500.0000 mL | Freq: Once | INTRAVENOUS | Status: AC
  Administered 2020-03-10: 500 mL via INTRAVENOUS

## 2020-03-10 MED ORDER — MORPHINE SULFATE (PF) 2 MG/ML IV SOLN
2.0000 mg | INTRAVENOUS | Status: DC | PRN
  Administered 2020-03-11 – 2020-03-16 (×8): 2 mg via INTRAVENOUS
  Filled 2020-03-10 (×9): qty 1

## 2020-03-10 MED ORDER — MORPHINE SULFATE (PF) 2 MG/ML IV SOLN
2.0000 mg | Freq: Four times a day (QID) | INTRAVENOUS | Status: DC | PRN
  Administered 2020-03-10 (×2): 2 mg via INTRAVENOUS
  Filled 2020-03-10 (×2): qty 1

## 2020-03-10 NOTE — Progress Notes (Signed)
PROGRESS NOTE    Loretta Savage  [id number] DOB: [birth year] DOA: 03/06/2020 PCP: Olin Hauser, DO   Brief Narrative:  54 y.o. Caucasian female with a known history of anxiety, GERD and cerebral palsy, who presented to the emergency room with acute onset of intractable nausea and vomiting which has been intermittent over the last few weeks since 4/18.  The patient was scheduled to have an EGD by Dr. Alice Reichert on Wednesday.  Her mother and caregiver believes that she may have had mild abdominal discomfort.  She has been coughing from 02/04/2020 when she was diagnosed with COVID-19.  She did not have any dyspnea or wheezing.  She did not have any bowel movement since Wednesday night and has been taking laxatives.  No chest pain or palpitations.  All history was obtained from the patient's mother as the patient was nonverbal close to her baseline.  5/13: Patient seen and examined.  Patient's mother is at bedside.  Patient remains nonverbal at baseline.  Per the mother the patient has not been vomiting since admission.  Patient seems to be morning secondary to pain.  Patient is not taking anything orally.   Assessment & Plan:   Active Problems:   Intractable nausea and vomiting  Intractable nausea and vomiting with subsequent volume depletion. No vomiting noted since admission Imaging reassuring Per the mother the patient has a history of a bleeding peptic ulcer but this was back in early 2000 Seen by Dr. Alice Reichert as outpatient, is being treated with Protonix, Carafate Plan: Continue IV hydration, resume diet Antiemetics, Protonix IV 40 bid, and Carafate GI following, s/p EGD done on 5/12: Impression:LA Grade D reflux esophagitis with bleeding. White nummular lesions in esophageal mucosa. Cells for cytology obtained.  Use Prilosec (omeprazole) 40 mg PO BID for 2 months. - Repeat upper endoscopy in 2 months to check healing. - Return to GI office in 1 month with Dr Alice Reichert. Biopsy  shows budding yeast   Esophageal candidiasis, biopsy showed budding yeast 5/13 Started Diflucan 20 mg IV daily, will switch to oral once stable.   Sinus tachycardia. Suspect secondary to volume depletion and dehydration IV fluids as above  GERD. On Protonix and Carafate  Anxiety. As needed Xanax  Acute anemia, unknown cause, Hb 14 on admission could be secondary to hemoconcentration  Hemoglobin dropped from 14 admission to 9 Iron deficiency, folate and B12 within normal range No obvious sources of bleeding noted Vitals remained stable Suspect that the 14 was due to hemoconcentration Baseline hemoglobin somewhat difficult to ascertain but appears to be closer to 10-11 Monitor for any bleeding Transfuse as needed for active bleeding or hemoglobin less than 7 Twice daily Protonix H&H remained stable, started Lovenox for prophylaxis    Iron deficiency, iron saturation 9% 5/12 started Venofer 200 mg IV daily for 5 days Start oral iron supplement with vitamin C on discharge  Hypoglycemia Low blood glucose secondary to poor oral intake 5/14 changed IVF to D5NS w/K 4 meq at 75 mh/hr   Follow hypoglycemia protocol Reassess hydration status daily  Hypophosphatemia secondary to decreased oral intake Phosphorus repleted.  Monitor and replete as needed  Hypokalemia: Potassium repleted.  Continue to monitor.  Hypomagnesemia, magnesium repleted.  Continue to monitor.    DVT prophylaxis: SCDs Code Status: Full Family Communication: Mother at bedside Disposition Plan: Status is: Inpatient  The patient will require care spanning > 2 midnights and should be moved to inpatient because: Inpatient level of care appropriate due to severity  of illness  Dispo: The patient is from: Home              Anticipated d/c is to: Home              Anticipated d/c date is: 2 days              Patient currently is not medically stable to d/c.  Patient has not had any recurrent emesis  since admission however has not had any p.o. intake.  Severity of illness at this time supports inpatient status.  Disposition Plan: 5/14 still patient has poor oral intake, D5NS w/k started, hypokalemia, hypophosphatemia. all electrolytes repleted.  Reassess tomorrow for disposition.    Consultants:   GI   Procedures:   EGD 5/12   Antimicrobials:  none   Subjective: Seen and examined at bedside, patient was moaning, seems to be in pain. Mother at bedside, stated that patient is not eating anything today, possible secondary to pain Patient nonverbal at baseline  Objective: Vitals:   03/09/20 1733 03/09/20 2236 03/09/20 2332 03/10/20 0746  BP: 120/60  116/68 107/66  Pulse: 96  (!) 41 91  Resp: 15  16 18   Temp: 99.9 F (37.7 C)  99.3 F (37.4 C) 98.8 F (37.1 C)  TempSrc: Axillary  Oral Oral  SpO2: 99% 100% 95% 96%  Weight:      Height:        Intake/Output Summary (Last 24 hours) at 03/10/2020 1122 Last data filed at 03/09/2020 1454 Gross per 24 hour  Intake 614.06 ml  Output --  Net 614.06 ml   Filed Weights   03/06/20 1417  Weight: 47.6 kg    Examination:  General exam: Calm, no distress, nonverbal at baseline, chronically ill Respiratory system: Bibasilar crackles, poor inspiratory effort Cardiovascular system: S1-S2 heard, no murmurs  gastrointestinal system: Obese, nondistended, positive bowel sounds Central nervous system: Unable to assess Extremities: Unable to assess Skin: No rashes, lesions or ulcers Psychiatry: Unable to assess    Data Reviewed: I have personally reviewed following labs and imaging studies  CBC: Recent Labs  Lab 03/06/20 1621 03/07/20 0415 03/09/20 0654 03/10/20 0557  WBC 13.5* 6.9 4.5 4.3  HGB 14.6 9.4* 10.2* 10.4*  HCT 46.1* 31.0* 32.0* 31.4*  MCV 92.4 98.7 92.2 91.5  PLT 408* 251 139* [id number]   Basic Metabolic Panel: Recent Labs  Lab 03/06/20 1621 03/07/20 0613 03/08/20 0536 03/09/20 0628 03/10/20 0625   NA 147* 151* 149* 142 138  K 3.9 2.9* 3.4* 3.1* 3.1*  CL 110 123* 120* 110 105  CO2 12* 18* 19* 24 27  GLUCOSE 126* 89 60* 96 109*  BUN 12 7 <5* <5* <5*  CREATININE 0.90 0.46 <0.30* 0.30* 0.37*  CALCIUM 10.1 8.0* 8.2* 8.3* 8.2*  MG  --   --  1.8 1.5* 1.9  PHOS  --   --  1.1* 2.2* 2.0*   GFR: Estimated Creatinine Clearance: 47.7 mL/min (A) (by C-G formula based on SCr of 0.37 mg/dL (L)). Liver Function Tests: Recent Labs  Lab 03/06/20 1621 03/07/20 0613  AST 23 17  ALT 19 14  ALKPHOS 68 39  BILITOT 1.7* 0.9  PROT 7.8 5.3*  ALBUMIN 4.3 2.8*   Recent Labs  Lab 03/06/20 1621  LIPASE 22   No results for input(s): AMMONIA in the last 168 hours. Coagulation Profile: No results for input(s): INR, PROTIME in the last 168 hours. Cardiac Enzymes: No results for input(s): CKTOTAL, CKMB, CKMBINDEX, TROPONINI in  the last 168 hours. BNP (last 3 results) No results for input(s): PROBNP in the last 8760 hours. HbA1C: No results for input(s): HGBA1C in the last 72 hours. CBG: Recent Labs  Lab 03/08/20 0825 03/08/20 0853 03/08/20 1137  GLUCAP 46* 68* 167*   Lipid Profile: No results for input(s): CHOL, HDL, LDLCALC, TRIG, CHOLHDL, LDLDIRECT in the last 72 hours. Thyroid Function Tests: No results for input(s): TSH, T4TOTAL, FREET4, T3FREE, THYROIDAB in the last 72 hours. Anemia Panel: Recent Labs    03/07/20 1652 03/07/20 1730  VITAMINB12 2,738*  --   FOLATE  --  21.6  FERRITIN 13  --   TIBC 318  --   IRON 28  --    Sepsis Labs: No results for input(s): PROCALCITON, LATICACIDVEN in the last 168 hours.  Recent Results (from the past 240 hour(s))  KOH prep     Status: None   Collection Time: 03/08/20 11:30 AM   Specimen: Esophagus  Result Value Ref Range Status   Specimen Description ESOPHAGUS  Final   Special Requests NONE  Final   KOH Prep   Final    BUDDING YEAST SEEN Performed at [facility], [address] [place], [place] [address]    Report  Status 03/08/2020 FINAL  Final         Radiology Studies: No results found.      Scheduled Meds: . doxazosin  2 mg Oral QHS  . fluticasone  2 spray Each Nare Daily  . loratadine  10 mg Oral Daily  . methenamine  1 g Oral BID WC  . pantoprazole (PROTONIX) IV  40 mg Intravenous Q12H  . sucralfate  1 g Oral TID WC & HS  . traZODone  50 mg Oral QHS   Continuous Infusions: . dextrose 5 % and 0.45% NaCl 75 mL/hr at 03/10/20 0302  . fluconazole (DIFLUCAN) IV Stopped (03/09/20 1424)  . iron sucrose 200 mg (03/10/20 0836)  . potassium PHOSPHATE IVPB (in mmol) 30 mmol (03/10/20 1004)     LOS: 3 days    Time spent: 35 minutes    Val Riles, MD [facility] Pager [phone]   If 7PM-7AM, please contact night-coverage 03/10/2020, 11:22 AM

## 2020-03-10 NOTE — Progress Notes (Signed)
   03/10/20 1604  Assess: MEWS Score  Temp 98.4 F (36.9 C)  BP (!) 83/68  Pulse Rate (!) 105  Resp 14  SpO2 (!) 87 %  O2 Device Room Air  O2 Flow Rate (L/min) 0 L/min  Assess: MEWS Score  MEWS Temp 0  MEWS Systolic 1  MEWS Pulse 1  MEWS RR 0  MEWS LOC 0  MEWS Score 2  MEWS Score Color Yellow  Assess: if the MEWS score is Yellow or Red  Were vital signs taken at a resting state? Yes  Focused Assessment Documented focused assessment  Early Detection of Sepsis Score *See Row Information* Low  MEWS guidelines implemented *See Row Information* No, vital signs rechecked  Document  Patient Outcome Stabilized after interventions  Progress note created (see row info) Yes  PT reassessed and VS stable upon recheck.

## 2020-03-10 NOTE — Progress Notes (Signed)
   03/10/20 1205  Assess: MEWS Score  ECG Heart Rate (!) 110  Assess: MEWS Score  MEWS Temp 0  MEWS Systolic 0  MEWS Pulse 1  MEWS RR 0  MEWS LOC 0  MEWS Score 1  MEWS Score Color Green  Assess: if the MEWS score is Yellow or Red  Were vital signs taken at a resting state? Yes  Focused Assessment Documented focused assessment  Early Detection of Sepsis Score *See Row Information* Low  MEWS guidelines implemented *See Row Information* No, vital signs rechecked  Document  Patient Outcome Stabilized after interventions  Progress note created (see row info) Yes  Notified by tele HR was increased, returned to 110 HR has been elevated periodically as her baseline.

## 2020-03-10 NOTE — Care Management Important Message (Signed)
Important Message  Patient Details  Name: Loretta Savage MRN: [id number] Date of Birth: 1966/01/26   Medicare Important Message Given:  N/A - LOS <3 / Initial given by admissions     [name] 03/10/2020, 7:40 AM

## 2020-03-11 ENCOUNTER — Inpatient Hospital Stay: Payer: Medicare Other

## 2020-03-11 DIAGNOSIS — I959 Hypotension, unspecified: Secondary | ICD-10-CM

## 2020-03-11 DIAGNOSIS — B3781 Candidal esophagitis: Secondary | ICD-10-CM | POA: Diagnosis present

## 2020-03-11 DIAGNOSIS — D500 Iron deficiency anemia secondary to blood loss (chronic): Secondary | ICD-10-CM

## 2020-03-11 DIAGNOSIS — R000 Tachycardia, unspecified: Secondary | ICD-10-CM

## 2020-03-11 DIAGNOSIS — I9589 Other hypotension: Secondary | ICD-10-CM

## 2020-03-11 DIAGNOSIS — E861 Hypovolemia: Secondary | ICD-10-CM

## 2020-03-11 DIAGNOSIS — K209 Esophagitis, unspecified without bleeding: Secondary | ICD-10-CM | POA: Diagnosis present

## 2020-03-11 LAB — CBC WITH DIFFERENTIAL/PLATELET
Abs Immature Granulocytes: 0.05 10*3/uL (ref 0.00–0.07)
Basophils Absolute: 0 10*3/uL (ref 0.0–0.1)
Basophils Relative: 1 %
Eosinophils Absolute: 0.2 10*3/uL (ref 0.0–0.5)
Eosinophils Relative: 3 %
HCT: 32 % — ABNORMAL LOW (ref 36.0–46.0)
Hemoglobin: 9.8 g/dL — ABNORMAL LOW (ref 12.0–15.0)
Immature Granulocytes: 1 %
Lymphocytes Relative: 30 %
Lymphs Abs: 1.9 10*3/uL (ref 0.7–4.0)
MCH: 29.3 pg (ref 26.0–34.0)
MCHC: 30.6 g/dL (ref 30.0–36.0)
MCV: 95.8 fL (ref 80.0–100.0)
Monocytes Absolute: 0.4 10*3/uL (ref 0.1–1.0)
Monocytes Relative: 6 %
Neutro Abs: 3.8 10*3/uL (ref 1.7–7.7)
Neutrophils Relative %: 59 %
Platelets: 260 10*3/uL (ref 150–400)
RBC: 3.34 MIL/uL — ABNORMAL LOW (ref 3.87–5.11)
RDW: 14.6 % (ref 11.5–15.5)
WBC: 6.4 10*3/uL (ref 4.0–10.5)
nRBC: 0 % (ref 0.0–0.2)

## 2020-03-11 LAB — BLOOD CULTURE ID PANEL (REFLEXED)

## 2020-03-11 LAB — TSH: TSH: 1.452 u[IU]/mL (ref 0.350–4.500)

## 2020-03-11 LAB — COMPREHENSIVE METABOLIC PANEL
ALT: 15 U/L (ref 0–44)
AST: 25 U/L (ref 15–41)
Albumin: 2.9 g/dL — ABNORMAL LOW (ref 3.5–5.0)
Alkaline Phosphatase: 45 U/L (ref 38–126)
Anion gap: 5 (ref 5–15)
BUN: <5 mg/dL — ABNORMAL LOW (ref 6–20)
CO2: 26 mmol/L (ref 22–32)
Calcium: 8.4 mg/dL — ABNORMAL LOW (ref 8.9–10.3)
Chloride: 111 mmol/L (ref 98–111)
Creatinine, Ser: 0.31 mg/dL — ABNORMAL LOW (ref 0.44–1.00)
GFR calc Af Amer: >60 mL/min (ref >60)
GFR calc non Af Amer: >60 mL/min (ref >60)
Glucose, Bld: 115 mg/dL — ABNORMAL HIGH (ref 70–99)
Potassium: 4.5 mmol/L (ref 3.5–5.1)
Sodium: 142 mmol/L (ref 135–145)
Total Bilirubin: 0.4 mg/dL (ref 0.3–1.2)
Total Protein: 5.3 g/dL — ABNORMAL LOW (ref 6.5–8.1)

## 2020-03-11 LAB — CBC
HCT: 29.6 % — ABNORMAL LOW (ref 36.0–46.0)
Hemoglobin: 9.2 g/dL — ABNORMAL LOW (ref 12.0–15.0)
MCH: 29.8 pg (ref 26.0–34.0)
MCHC: 31.1 g/dL (ref 30.0–36.0)
MCV: 95.8 fL (ref 80.0–100.0)
Platelets: 233 10*3/uL (ref 150–400)
RBC: 3.09 MIL/uL — ABNORMAL LOW (ref 3.87–5.11)
RDW: 15.2 % (ref 11.5–15.5)
WBC: 4.5 10*3/uL (ref 4.0–10.5)
nRBC: 0 % (ref 0.0–0.2)

## 2020-03-11 LAB — LACTIC ACID, PLASMA
Lactic Acid, Venous: 1.5 mmol/L (ref 0.5–1.9)
Lactic Acid, Venous: 1.8 mmol/L (ref 0.5–1.9)
Lactic Acid, Venous: 2.3 mmol/L (ref 0.5–1.9)
Lactic Acid, Venous: 2.2 mmol/L (ref 0.5–1.9)

## 2020-03-11 LAB — URINALYSIS, COMPLETE (UACMP) WITH MICROSCOPIC
Bilirubin Urine: NEGATIVE
Glucose, UA: NEGATIVE mg/dL
Hgb urine dipstick: NEGATIVE
Ketones, ur: NEGATIVE mg/dL
Leukocytes,Ua: NEGATIVE
Nitrite: NEGATIVE
Protein, ur: NEGATIVE mg/dL
Specific Gravity, Urine: 1.009 (ref 1.005–1.030)
Squamous Epithelial / HPF: NONE SEEN (ref 0–5)
pH: 6 (ref 5.0–8.0)

## 2020-03-11 LAB — MAGNESIUM: Magnesium: 1.6 mg/dL — ABNORMAL LOW (ref 1.7–2.4)

## 2020-03-11 LAB — PROCALCITONIN: Procalcitonin: 1.26 ng/mL

## 2020-03-11 LAB — LIPASE, BLOOD: Lipase: 24 U/L (ref 11–51)

## 2020-03-11 LAB — PHOSPHORUS: Phosphorus: 2.2 mg/dL — ABNORMAL LOW (ref 2.5–4.6)

## 2020-03-11 MED ORDER — MAGNESIUM SULFATE 2 GM/50ML IV SOLN
2.0000 g | Freq: Once | INTRAVENOUS | Status: AC
  Administered 2020-03-11: 2 g via INTRAVENOUS
  Filled 2020-03-11: qty 50

## 2020-03-11 MED ORDER — KETOROLAC TROMETHAMINE 30 MG/ML IJ SOLN
15.0000 mg | Freq: Once | INTRAMUSCULAR | Status: AC
  Administered 2020-03-11: 15 mg via INTRAVENOUS
  Filled 2020-03-11: qty 1

## 2020-03-11 MED ORDER — SODIUM PHOSPHATES 45 MMOLE/15ML IV SOLN
30.0000 mmol | Freq: Once | INTRAVENOUS | Status: AC
  Administered 2020-03-11: 30 mmol via INTRAVENOUS
  Filled 2020-03-11: qty 10

## 2020-03-11 MED ORDER — SIMETHICONE 80 MG PO CHEW
80.0000 mg | CHEWABLE_TABLET | Freq: Once | ORAL | Status: AC
  Administered 2020-03-11: 80 mg via ORAL
  Filled 2020-03-11: qty 1

## 2020-03-11 MED ORDER — SODIUM CHLORIDE 0.9 % IV BOLUS
500.0000 mL | Freq: Once | INTRAVENOUS | Status: AC
  Administered 2020-03-11: 500 mL via INTRAVENOUS

## 2020-03-11 MED ORDER — POTASSIUM PHOSPHATES 15 MMOLE/5ML IV SOLN: 30.0000 mmol | Freq: Once | INTRAVENOUS | Status: DC

## 2020-03-11 MED ORDER — LACTATED RINGERS IV BOLUS
1000.0000 mL | Freq: Once | INTRAVENOUS | Status: AC
  Administered 2020-03-11: 1000 mL via INTRAVENOUS

## 2020-03-11 NOTE — Progress Notes (Signed)
   03/10/20 2040  Assess: MEWS Score  ECG Heart Rate (!) 133  Assess: MEWS Score  MEWS Temp 0  MEWS Systolic 0  MEWS Pulse 3  MEWS RR 0  MEWS LOC 0  MEWS Score 3  MEWS Score Color Yellow  Assess: if the MEWS score is Yellow or Red  Were vital signs taken at a resting state? Yes  Focused Assessment Documented focused assessment  Early Detection of Sepsis Score *See Row Information* Medium  MEWS guidelines implemented *See Row Information* Yes  Treat  MEWS Interventions Other (Comment) (notified NP )  Notify: Charge Nurse/RN  Name of Charge Nurse/RN Notified Wilder Glade  Date Charge Nurse/RN Notified 03/10/20  Time Charge Nurse/RN Notified 2040  Notify: Provider  Provider Name/Title Rufina Falco   Date Provider Notified 03/10/20  Time Provider Notified 2045  Notification Type Page  Notification Reason Change in status  Response See new orders  Date of Provider Response 03/10/20  Time of Provider Response 2059

## 2020-03-11 NOTE — Plan of Care (Signed)
  Problem: Clinical Measurements: Goal: Cardiovascular complication will be avoided Outcome: Not Progressing   Problem: Coping: Goal: Level of anxiety will decrease Outcome: Not Progressing   Problem: Pain Managment: Goal: General experience of comfort will improve Outcome: Not Progressing  Patient heart rate elevated as high as 150's, MEWS score was 2 and 3 yellow, given multiple IV bolus, and pain medication. Lab work done for sepsis work up. Patient had urine retention bladder scanned volume greater than 713ml, In and Out cath was done.

## 2020-03-11 NOTE — Progress Notes (Signed)
PHARMACY - PHYSICIAN COMMUNICATION CRITICAL VALUE ALERT - BLOOD CULTURE IDENTIFICATION (BCID)  Loretta Savage is an 54 y.o. female who presented to [facility] on 03/06/2020 with a chief complaint of intractable nausea and vomiting, volume depletion, esophageal candidiasis, sinus tachycardia.  Assessment:  1/4 bottles GPC. BCID detected methicillin susceptible CoNS. This result could reflect contamination.   Name of physician (or Provider) Contacted: Dr. Lonny Prude  Current antibiotics: Methenamine (UTI ppx)  Changes to prescribed antibiotics recommended:  If no clinical suspicion for bacteremia, no changes. If clinical suspicion for bacteremia can consider cefazolin. Patient does have a history of PCN allergy (hives). No record of receipt of cephalosporins. Small likelihood of cross reactivity.   Results for orders placed or performed during the hospital encounter of 03/06/20  Blood Culture ID Panel (Reflexed) (Collected: 03/11/2020  2:03 AM)  Result Value Ref Range   Enterococcus species NOT DETECTED NOT DETECTED   Listeria monocytogenes NOT DETECTED NOT DETECTED   Staphylococcus species DETECTED (A) NOT DETECTED   Staphylococcus aureus (BCID) NOT DETECTED NOT DETECTED   Methicillin resistance NOT DETECTED NOT DETECTED   Streptococcus species NOT DETECTED NOT DETECTED   Streptococcus agalactiae NOT DETECTED NOT DETECTED   Streptococcus pneumoniae NOT DETECTED NOT DETECTED   Streptococcus pyogenes NOT DETECTED NOT DETECTED   Acinetobacter baumannii NOT DETECTED NOT DETECTED   Enterobacteriaceae species NOT DETECTED NOT DETECTED   Enterobacter cloacae complex NOT DETECTED NOT DETECTED   Escherichia coli NOT DETECTED NOT DETECTED   Klebsiella oxytoca NOT DETECTED NOT DETECTED   Klebsiella pneumoniae NOT DETECTED NOT DETECTED   Proteus species NOT DETECTED NOT DETECTED   Serratia marcescens NOT DETECTED NOT DETECTED   Haemophilus influenzae NOT DETECTED NOT DETECTED   Neisseria  meningitidis NOT DETECTED NOT DETECTED   Pseudomonas aeruginosa NOT DETECTED NOT DETECTED   Candida albicans NOT DETECTED NOT DETECTED   Candida glabrata NOT DETECTED NOT DETECTED   Candida krusei NOT DETECTED NOT DETECTED   Candida parapsilosis NOT DETECTED NOT DETECTED   Candida tropicalis NOT DETECTED NOT [place] Resident 03/11/2020  5:50 PM

## 2020-03-11 NOTE — Plan of Care (Signed)
  Problem: Clinical Measurements: Goal: Ability to maintain clinical measurements within normal limits will improve Outcome: Progressing Goal: Will remain free from infection Outcome: Progressing Goal: Cardiovascular complication will be avoided Outcome: Progressing   Problem: Nutrition: Goal: Adequate nutrition will be maintained Outcome: Progressing   Problem: Elimination: Goal: Will not experience complications related to bowel motility Outcome: Progressing Goal: Will not experience complications related to urinary retention Outcome: Progressing   Problem: Pain Managment: Goal: General experience of comfort will improve Outcome: Progressing

## 2020-03-11 NOTE — Progress Notes (Signed)
Spoke with NP Rufina Falco about the MEWS and elevated heart, per Ouma she placed orders for pain medications and IV bolus. At 0055 sent NP Ouma a message about the patient elevated HR in the 150's. At 104am NP Ouma replied she is going to do a Sepsis work up. At [place] sent message to NP Ouma patient is incontinent does she want the UA to be obtain via in and out cath, at NP replied "please". Sent message to NP Ouma to notify that the lab tech was not able to obtain the lactic acid and 2nd blood culture (patient is a hard stick). At 0227 notified NP Ouma the other lab work that was ordered was not obtained, patient was stuck multiple time.

## 2020-03-11 NOTE — Progress Notes (Signed)
[facility]  PROGRESS NOTE  Loretta Savage [id number] DOB: 05-09-1966 DOA: 03/06/2020 PCP: Olin Hauser, DO Admit date - 03/06/2020   Admitting Physician Sidney Ace, MD  Outpatient Primary MD for the patient is Olin Hauser, DO  LOS - 4 Brief Narrative   Loretta Savage is a 54 y.o. year old female with medical history significant for cerebral palsy, GERD, anxiety who presented on 03/06/2020 with several weeks of intractable nausea and vomiting and was found to have esophagitis with bleeding complicated by esophageal candidiasis confirmed on EGD with biopsy. Hospital course complicated by diminished oral intake with resultant hypophosphatemia, hypoglycemia, sinus tachycardia, hypotension.    Subjective  Today overnight blood pressure dropped to the 80s requiring fluid bolus.  A & P   Persistent sinus tachycardia, suspect related to dehydration.  Other work-up unremarkable (within normal limits TSH, hemoglobin stable, blood pressure responsive to IV fluids) -Continue IV fluids -Continue to monitor heart rate  Hypotension, improving.  Nadir SBP in the 80s responded to fluid bolus.  Suspect related to dehydration given stable hemoglobin and no fevers, slight lactic acidosis of 2.2.  Procalcitonin is 1.26 so infection is a possibility, currently remains afebrile without leukocytosis (UA unremarkable, chest x-ray unremarkable) -Trend lactic acid -Continue IV fluids -Low threshold to add antibiotics if becomes febrile -Monitor blood cultures  1 of 4 blood cultures growing GPC (methicillin sensitive).  Suspect contamination given patient is afebrile have more likely explanation for hypotension/tachycardia. -Hold off any antibiotics currently.  Esophageal candidiasis.  Concerning findings on EGD.  Biopsy showed budding yeast -Continue IV Diflucan until able to tolerate p.o.   Esophagitis with bleeding found on EGD.  Hemoglobin remained stable, not  having any nausea or vomiting. Intractable nausea and vomiting, resolved -Continue IV PPI once able to tolerate p.o. -On discharge need to continue Prilosec 40 mg p.o. twice daily for 2 months with repeat EGD in 2 months to check healing. -Return to GI office in 1 month with Dr. Alice Reichert  Normocytic anemia, assist with iron deficiency hemoglobin stable at 9-10.  No signs or symptoms of bleeding.  14 on admission, suspect hemoconcentration, prior hemoglobins closer to 10-11 -Monitor CBC -Continue Venofer for total of 5 days -Start oral iron supplementation on discharge  Hypoglycemia in setting of diminished oral intake, laxatives esophagitis -Currently on D5 normal saline IV fluids  Hypophosphatemia likely also with a to diminished oral intake -Replete phosphorus as necessary -Monitor phosphorus level daily  Hypomagnesemia.  Not currently have any diarrhea potassium within normal limits. -Replete IV, repeat mag in a.m.      Family Communication  : Mother updated at bedside  Code Status : Full  Disposition Plan  :  Patient is from home. Anticipated d/c date: 2 to 3 days. Barriers to d/c or necessity for inpatient status:  Currently requiring IV supplementation with Diflucan for esophageal candidiasis, IV Protonix for esophagitis, aggressive repletion of magnesium/phosphorus/potassium in setting of diminished oral intake related to esophagitis. Consults  : Gastroenterology  Procedures  :    EGD, 03/10/2020:  DVT Prophylaxis  :  Lovenox   Lab Results  Component Value Date   PLT 233 03/11/2020    Diet :  Diet Order            DIET SOFT Room service appropriate? Yes; Fluid consistency: Thin  Diet effective now               Inpatient Medications Scheduled Meds: . doxazosin  2 mg  Oral QHS  . enoxaparin (LOVENOX) injection  40 mg Subcutaneous Q24H  . fluticasone  2 spray Each Nare Daily  . loratadine  10 mg Oral Daily  . methenamine  1 g Oral BID WC  . pantoprazole  (PROTONIX) IV  40 mg Intravenous Q12H  . sucralfate  1 g Oral TID WC & HS  . traZODone  50 mg Oral QHS   Continuous Infusions: . dextrose 5 % and 0.9 % NaCl with KCl 40 mEq/L 125 mL/hr at 03/11/20 1641  . fluconazole (DIFLUCAN) IV 200 mg (03/10/20 1546)  . iron sucrose 200 mg (03/11/20 1044)   PRN Meds:.acetaminophen **OR** acetaminophen, morphine injection, ondansetron **OR** ondansetron (ZOFRAN) IV, traZODone  Antibiotics  :   Anti-infectives (From admission, onward)   Start     Dose/Rate Route Frequency Ordered Stop   03/09/20 1400  fluconazole (DIFLUCAN) IVPB 200 mg    Note to Pharmacy: We will switch to oral once patient will start taking oral medications   200 mg 100 mL/hr over 60 Minutes Intravenous Every 24 hours 03/09/20 1144 03/23/20 0959   03/07/20 0800  methenamine (MANDELAMINE) tablet 1 g     1 g Oral 2 times daily with meals 03/06/20 2207         Objective   Vitals:   03/11/20 0803 03/11/20 1100 03/11/20 1225 03/11/20 1606  BP: (!) 86/47  124/74 122/83  Pulse: (!) 118  (!) 122 (!) 135  Resp: 17  16 17   Temp:  99.5 F (37.5 C)  98.9 F (37.2 C)  TempSrc:  Axillary  Axillary  SpO2:   96% 97%  Weight:      Height:        SpO2: 97 % O2 Flow Rate (L/min): 0 L/min  Wt Readings from Last 3 Encounters:  03/06/20 47.6 kg  03/02/20 52.2 kg  02/24/20 52.2 kg     Intake/Output Summary (Last 24 hours) at 03/11/2020 1752 Last data filed at 03/11/2020 1228 Gross per 24 hour  Intake 2654 ml  Output 860 ml  Net 1794 ml    Physical Exam:    Vocalizing incomprehensibly during exam (baseline per mother at bedside), Awake, not responding to voice No new F.N deficits,  [place].AT, Normal respiratory effort on room air, CTAB Tachycardic no Gallops,Rubs or new Murmurs,  +ve B.Sounds, Abd Soft, No tenderness, No rebound, guarding or rigidity. No Cyanosis, No new Rash or bruise    I have personally reviewed the following:   Data Reviewed:  CBC Recent Labs    Lab 03/07/20 0415 03/09/20 0654 03/10/20 0557 03/11/20 0347 03/11/20 1233  WBC 6.9 4.5 4.3 6.4 4.5  HGB 9.4* 10.2* 10.4* 9.8* 9.2*  HCT 31.0* 32.0* 31.4* 32.0* 29.6*  PLT 251 139* 265 260 233  MCV 98.7 92.2 91.5 95.8 95.8  MCH 29.9 29.4 30.3 29.3 29.8  MCHC 30.3 31.9 33.1 30.6 31.1  RDW 14.6 14.6 14.6 14.6 15.2  LYMPHSABS  --   --   --  1.9  --   MONOABS  --   --   --  0.4  --   EOSABS  --   --   --  0.2  --   BASOSABS  --   --   --  0.0  --     Chemistries  Recent Labs  Lab 03/06/20 1621 03/06/20 1621 03/07/20 [id number] 03/07/20 [id number] 03/08/20 0536 03/09/20 0628 03/10/20 0625 03/10/20 1551 03/11/20 0347  NA 147*   < > 151*   < >  149* 142 138 139 142  K 3.9   < > 2.9*   < > 3.4* 3.1* 3.1* 4.1 4.5  CL 110   < > 123*   < > 120* 110 105 105 111  CO2 12*   < > 18*   < > 19* 24 27 27 26   GLUCOSE 126*   < > 89   < > 60* 96 109* 129* 115*  BUN 12   < > 7   < > <5* <5* <5* <5* <5*  CREATININE 0.90   < > 0.46   < > <0.30* 0.30* 0.37* 0.43* 0.31*  CALCIUM 10.1   < > 8.0*   < > 8.2* 8.3* 8.2* 8.4* 8.4*  MG  --   --   --   --  1.8 1.5* 1.9  --  1.6*  AST 23  --  17  --   --   --   --   --  25  ALT 19  --  14  --   --   --   --   --  15  ALKPHOS 68  --  39  --   --   --   --   --  45  BILITOT 1.7*  --  0.9  --   --   --   --   --  0.4   < > = values in this interval not displayed.   ------------------------------------------------------------------------------------------------------------------ No results for input(s): CHOL, HDL, LDLCALC, TRIG, CHOLHDL, LDLDIRECT in the last 72 hours.  Lab Results  Component Value Date   HGBA1C 4.9 07/29/2019   ------------------------------------------------------------------------------------------------------------------ Recent Labs    03/11/20 0347  TSH 1.452   ------------------------------------------------------------------------------------------------------------------ No results for input(s): VITAMINB12, FOLATE, FERRITIN, TIBC,  IRON, RETICCTPCT in the last 72 hours.  Coagulation profile No results for input(s): INR, PROTIME in the last 168 hours.  No results for input(s): DDIMER in the last 72 hours.  Cardiac Enzymes No results for input(s): CKMB, TROPONINI, MYOGLOBIN in the last 168 hours.  Invalid input(s): CK ------------------------------------------------------------------------------------------------------------------ No results found for: BNP  Micro Results Recent Results (from the past 240 hour(s))  KOH prep     Status: None   Collection Time: 03/08/20 11:30 AM   Specimen: Esophagus  Result Value Ref Range Status   Specimen Description ESOPHAGUS  Final   Special Requests NONE  Final   KOH Prep   Final    BUDDING YEAST SEEN Performed at [facility], [address] [place], [place] [address]    Report Status 03/08/2020 FINAL  Final  CULTURE, BLOOD (ROUTINE X 2) w Reflex to ID Panel     Status: None (Preliminary result)   Collection Time: 03/11/20  2:03 AM   Specimen: BLOOD  Result Value Ref Range Status   Specimen Description BLOOD BLOOD RIGHT FOREARM  Final   Special Requests   Final    BOTTLES DRAWN AEROBIC AND ANAEROBIC Blood Culture adequate volume   Culture  Setup Time   Final    ANAEROBIC BOTTLE ONLY GRAM POSITIVE COCCI Organism ID to follow CRITICAL RESULT CALLED TO, READ BACK BY AND VERIFIED WITH: [place] @1727  03/11/20 MJU Performed at [facility], [address] [place], [place] [address]    Culture [facility] POSITIVE COCCI  Final   Report Status PENDING  Incomplete  Blood Culture ID Panel (Reflexed)     Status: Abnormal   Collection Time: 03/11/20  2:03 AM  Result Value Ref Range  Status   Enterococcus species NOT DETECTED NOT DETECTED Final   Listeria monocytogenes NOT DETECTED NOT DETECTED Final   Staphylococcus species DETECTED (A) NOT DETECTED Final    Comment: Methicillin (oxacillin) susceptible coagulase negative staphylococcus. Possible blood  culture contaminant (unless isolated from more than one blood culture draw or clinical case suggests pathogenicity). No antibiotic treatment is indicated for blood  culture contaminants. CRITICAL RESULT CALLED TO, READ BACK BY AND VERIFIED WITH: ALEX CHAPPELL @1727  03/11/20 MJU    Staphylococcus aureus (BCID) NOT DETECTED NOT DETECTED Final   Methicillin resistance NOT DETECTED NOT DETECTED Final   Streptococcus species NOT DETECTED NOT DETECTED Final   Streptococcus agalactiae NOT DETECTED NOT DETECTED Final   Streptococcus pneumoniae NOT DETECTED NOT DETECTED Final   Streptococcus pyogenes NOT DETECTED NOT DETECTED Final   Acinetobacter baumannii NOT DETECTED NOT DETECTED Final   Enterobacteriaceae species NOT DETECTED NOT DETECTED Final   Enterobacter cloacae complex NOT DETECTED NOT DETECTED Final   Escherichia coli NOT DETECTED NOT DETECTED Final   Klebsiella oxytoca NOT DETECTED NOT DETECTED Final   Klebsiella pneumoniae NOT DETECTED NOT DETECTED Final   Proteus species NOT DETECTED NOT DETECTED Final   Serratia marcescens NOT DETECTED NOT DETECTED Final   Haemophilus influenzae NOT DETECTED NOT DETECTED Final   Neisseria meningitidis NOT DETECTED NOT DETECTED Final   Pseudomonas aeruginosa NOT DETECTED NOT DETECTED Final   Candida albicans NOT DETECTED NOT DETECTED Final   Candida glabrata NOT DETECTED NOT DETECTED Final   Candida krusei NOT DETECTED NOT DETECTED Final   Candida parapsilosis NOT DETECTED NOT DETECTED Final   Candida tropicalis NOT DETECTED NOT DETECTED Final    Comment: Performed at [facility], [address] [place], [place] [address]  CULTURE, BLOOD (ROUTINE X 2) w Reflex to ID Panel     Status: None (Preliminary result)   Collection Time: 03/11/20  3:47 AM   Specimen: BLOOD  Result Value Ref Range Status   Specimen Description BLOOD BLOOD RIGHT HAND  Final   Special Requests   Final    BOTTLES DRAWN AEROBIC AND ANAEROBIC Blood Culture results  may not be optimal due to an inadequate volume of blood received in culture bottles   Culture   Final    NO GROWTH < 12 HOURS Performed at [facility], [address] [place],  [address]    Report Status PENDING  Incomplete    Radiology Reports DG Chest 1 View  Result Date: 03/11/2020 CLINICAL DATA:  Sepsis EXAM: CHEST  1 VIEW COMPARISON:  This 03/02/2020 FINDINGS: The heart size and mediastinal contours are within normal limits. Both lungs are clear. Dextroscoliosis with incompletely visualized spinal rods. Gas distended colon is visible in the upper abdomen. IMPRESSION: No active cardiopulmonary disease. Electronically Signed   By: Ulyses Jarred M.D.   On: 03/11/2020 01:30   DG Chest 1 View  Result Date: 03/02/2020 CLINICAL DATA:  Cough, vomiting EXAM: CHEST  1 VIEW COMPARISON:  02/13/2020, 02/25/2020 FINDINGS: Single frontal view of the chest demonstrates stable spinal fusion rods. Continued right convex scoliosis. Cardiac silhouette is unchanged. No airspace disease, effusion, or pneumothorax. Chronic degenerative changes right shoulder. No acute fractures. IMPRESSION: 1. No acute intrathoracic process. Electronically Signed   By: Randa Ngo M.D.   On: 03/02/2020 17:25   DG Abdomen 1 View  Result Date: 03/06/2020 CLINICAL DATA:  Nausea and vomiting EXAM: ABDOMEN - 1 VIEW COMPARISON:  02/13/2020 FINDINGS: Postsurgical changes are noted in the thoracolumbar spine  with stable scoliosis concave to the right at the thoracolumbar junction. Scattered large and small bowel gas is noted. No obstructive changes are seen. No free air is noted. No abnormal mass or abnormal calcifications are seen. IMPRESSION: No acute abnormality noted. Electronically Signed   By: Inez Catalina M.D.   On: 03/06/2020 19:20   CT ABDOMEN PELVIS W CONTRAST  Result Date: 03/02/2020 CLINICAL DATA:  Nausea and vomiting EXAM: CT ABDOMEN AND PELVIS WITH CONTRAST TECHNIQUE: Multidetector CT imaging of the  abdomen and pelvis was performed using the standard protocol following bolus administration of intravenous contrast. CONTRAST:  29mL OMNIPAQUE IOHEXOL 300 MG/ML  SOLN COMPARISON:  CT 02/25/2020, 08/03/2014 FINDINGS: Lower chest: Lung bases demonstrate atelectasis at the left base. No consolidation or pleural effusion. Moderate hiatal hernia Hepatobiliary: Central hepatic cysts. No calcified gallstone or biliary dilatation. Pancreas: Unremarkable. No pancreatic ductal dilatation or surrounding inflammatory changes. Spleen: Normal in size without focal abnormality. Adrenals/Urinary Tract: Adrenal glands are unremarkable. Kidneys are normal, without renal calculi, focal lesion, or hydronephrosis. Bladder is unremarkable. Stomach/Bowel: Stomach is within normal limits. Appendix not clearly identified but no right lower quadrant inflammatory process. No evidence of bowel wall thickening, distention, or inflammatory changes. Vascular/Lymphatic: Nonaneurysmal aorta. No suspicious adenopathy. Reproductive: No adnexal mass. Other: Negative for free air or free fluid. Musculoskeletal: Scoliosis of the spine with thoracolumbar spinal rods. Bilateral acetabular dysplasia. No acute osseous abnormality. IMPRESSION: 1. No CT evidence for acute intra-abdominal or pelvic abnormality. 2. Moderate hiatal hernia. Electronically Signed   By: Donavan Foil M.D.   On: 03/02/2020 17:48   CT ABDOMEN PELVIS W CONTRAST  Result Date: 02/25/2020 CLINICAL DATA:  Nausea and vomiting with acute abdominal pain EXAM: CT ABDOMEN AND PELVIS WITH CONTRAST TECHNIQUE: Multidetector CT imaging of the abdomen and pelvis was performed using the standard protocol following bolus administration of intravenous contrast. CONTRAST:  74mL OMNIPAQUE IOHEXOL 300 MG/ML  SOLN COMPARISON:  08/03/2014 FINDINGS: Lower chest:  Moderate sliding hiatal hernia Hepatobiliary: Small cystic density in the central liver.No evidence of biliary obstruction or stone. Pancreas:  Unremarkable. Spleen: Unremarkable. Adrenals/Urinary Tract: Negative adrenals. No hydronephrosis or stone. Unremarkable bladder. Stomach/Bowel: No obstruction. No appendicitis. Moderate desiccated stool, but nonobstructive. Vascular/Lymphatic: No acute vascular abnormality. No mass or adenopathy. Reproductive:No pathologic findings. Other: No ascites or pneumoperitoneum. Musculoskeletal: No acute abnormalities. Scoliosis and hip dysplasia. Marked muscular atrophy. No acute osseous finding. IMPRESSION: 1. No acute finding. Negative for bowel obstruction or visible inflammation. 2. Hiatal hernia. Electronically Signed   By: Monte Fantasia M.D.   On: 02/25/2020 04:14   DG Abdomen Acute W/Chest  Result Date: 02/13/2020 CLINICAL DATA:  Abdominal pain and vomiting. COVID positive 1 week ago. EXAM: DG ABDOMEN ACUTE W/ 1V CHEST COMPARISON:  Chest radiograph 12/09/2014. FINDINGS: Unchanged heart size and mediastinal contours, normal anatomy distorted by scoliotic curvature of the thoracolumbar spine. There is streaky retrocardiac opacities at the left lung base. No other focal airspace disease. No pulmonary edema or large pleural effusion. Remote left clavicle fracture. Chronic changes about the right shoulder. Air throughout nondilated small and large bowel in the abdomen without evidence of obstruction. No free air. Small volume of stool in the transverse and sigmoid colon. No visualized radiopaque calculi or abnormal soft tissue calcifications. Scoliotic curvature of the spine with Harrington rod fixation hardware in place. Bones are under mineralized. IMPRESSION: 1. Streaky retrocardiac opacities at the left lung base, atelectasis versus pneumonia. 2. Normal bowel gas pattern without obstruction.  No free air. Electronically  Signed   By: Keith Rake M.D.   On: 02/13/2020 15:05   US Abdomen Limited RUQ  Result Date: 03/06/2020 CLINICAL DATA:  Vomiting for 1 month EXAM: ULTRASOUND ABDOMEN LIMITED RIGHT UPPER  QUADRANT COMPARISON:  03/02/2020 FINDINGS: Gallbladder: No gallstones or wall thickening visualized. No sonographic Murphy sign noted by sonographer. Common bile duct: Diameter: 2 mm (assessment is however markedly limited by patient body habitus. Area felt to represent the common bile duct is measured and is anterior to portal venous structures. These areas are not well evaluated given limitations of the current exam. Liver: Small cysts in the liver are similar to recent CT evaluation. The liver is incompletely assessed due to overlying: Along the RIGHT hepatic lobe, bowel gas in the midline also limiting assessment of midline structures. Portal vein is patent on color Doppler imaging with normal direction of blood flow towards the liver. Again assessment of the porta hepatis is limited by body habitus. Other: No ascites. IMPRESSION: 1. Limited evaluation due to body habitus and overlying bowel gas as described. 2. Within the limitations of the current study no ultrasound evidence of acute cholecystitis. Electronically Signed   By: Zetta Bills M.D.   On: 03/06/2020 17:49     Time Spent in minutes  30     Desiree Hane M.D on 03/11/2020 at 5:52 PM  To page go to [email or web address] - password [facility]

## 2020-03-12 ENCOUNTER — Inpatient Hospital Stay: Payer: Medicare Other

## 2020-03-12 DIAGNOSIS — R194 Change in bowel habit: Secondary | ICD-10-CM

## 2020-03-12 LAB — BASIC METABOLIC PANEL
Anion gap: 5 (ref 5–15)
BUN: <5 mg/dL — ABNORMAL LOW (ref 6–20)
CO2: 29 mmol/L (ref 22–32)
Calcium: 8.1 mg/dL — ABNORMAL LOW (ref 8.9–10.3)
Chloride: 109 mmol/L (ref 98–111)
Creatinine, Ser: <0.3 mg/dL — ABNORMAL LOW (ref 0.44–1.00)
Glucose, Bld: 93 mg/dL (ref 70–99)
Potassium: 4.6 mmol/L (ref 3.5–5.1)
Sodium: 143 mmol/L (ref 135–145)

## 2020-03-12 LAB — MAGNESIUM: Magnesium: 1.9 mg/dL (ref 1.7–2.4)

## 2020-03-12 LAB — CBC
HCT: 32.4 % — ABNORMAL LOW (ref 36.0–46.0)
Hemoglobin: 10.3 g/dL — ABNORMAL LOW (ref 12.0–15.0)
MCH: 29.5 pg (ref 26.0–34.0)
MCHC: 31.8 g/dL (ref 30.0–36.0)
MCV: 92.8 fL (ref 80.0–100.0)
Platelets: 177 10*3/uL (ref 150–400)
RBC: 3.49 MIL/uL — ABNORMAL LOW (ref 3.87–5.11)
RDW: 15.6 % — ABNORMAL HIGH (ref 11.5–15.5)
WBC: 4.7 10*3/uL (ref 4.0–10.5)
nRBC: 0 % (ref 0.0–0.2)

## 2020-03-12 LAB — PHOSPHORUS: Phosphorus: 2.5 mg/dL (ref 2.5–4.6)

## 2020-03-12 MED ORDER — POLYETHYLENE GLYCOL 3350 17 G PO PACK
17.0000 g | PACK | Freq: Two times a day (BID) | ORAL | Status: DC
  Administered 2020-03-12 – 2020-03-15 (×4): 17 g via ORAL
  Filled 2020-03-12 (×6): qty 1

## 2020-03-12 MED ORDER — DEXTROSE-NACL 5-0.45 % IV SOLN: INTRAVENOUS | Status: DC

## 2020-03-12 MED ORDER — SENNOSIDES-DOCUSATE SODIUM 8.6-50 MG PO TABS
2.0000 | ORAL_TABLET | Freq: Two times a day (BID) | ORAL | Status: DC
  Administered 2020-03-12 – 2020-03-20 (×8): 2 via ORAL
  Filled 2020-03-12 (×11): qty 2

## 2020-03-12 MED ORDER — ENSURE ENLIVE PO LIQD
237.0000 mL | Freq: Three times a day (TID) | ORAL | Status: DC
  Administered 2020-03-12 – 2020-03-15 (×6): 237 mL via ORAL

## 2020-03-12 NOTE — Progress Notes (Signed)
[facility]  PROGRESS NOTE  Loretta Savage [id number] DOB: Feb 14, 1966 DOA: 03/06/2020 PCP: Olin Hauser, DO Admit date - 03/06/2020   Admitting Physician Sidney Ace, MD  Outpatient Primary MD for the patient is Olin Hauser, DO  LOS - 5 Brief Narrative   Loretta Savage is a 54 y.o. year old female with medical history significant for cerebral palsy, GERD, anxiety who presented on 03/06/2020 with several weeks of intractable nausea and vomiting and was found to have esophagitis with bleeding complicated by esophageal candidiasis confirmed on EGD with biopsy. Hospital course complicated by diminished oral intake with resultant hypophosphatemia, hypoglycemia, sinus tachycardia, hypotension.    Subjective  Today mother reports she thinks she is doing a bit better but she is worried about her cough.  She would like to try giving her Ensure today  A & P   Persistent sinus tachycardia, suspect related to dehydration, improving some with heart rate in the 110s to 120s.  Other work-up unremarkable (within normal limits TSH, hemoglobin stable, blood pressure responsive to IV fluids) -Continue IV fluids -Continue to monitor heart rate -Encourage oral hydration (soft diet) safely with assistance, aspiration precautions  Hypotension, improving.  Responding to maintenance fluids.  Suspect related to dehydration given stable hemoglobin and no fevers, slight lactic acidosis of 2.2.  Procalcitonin is 1.26 so infection is a possibility, currently remains afebrile without leukocytosis (UA unremarkable, chest x-ray unremarkable) -Continue IV fluids -Low threshold to add antibiotics if becomes febrile -Monitor blood cultures  1 of 4 blood cultures growing GPC (methicillin sensitive).  Suspect contamination given patient is afebrile have more likely explanation for hypotension/tachycardia. -Hold off any antibiotics currently suspect skin contamination  Esophageal  candidiasis.  Concerning findings on EGD.  Biopsy showed budding yeast -Continue IV Diflucan until able to tolerate p.o.   Esophagitis with bleeding found on EGD.  Hemoglobin remained stable, not having any nausea or vomiting. Intractable nausea and vomiting, resolved -Continue IV PPI once able to tolerate p.o. -On discharge need to continue Prilosec 40 mg p.o. twice daily for 2 months with repeat EGD in 2 months to check healing. -Return to GI office in 1 month with Dr. Alice Reichert  Normocytic anemia, assist with iron deficiency hemoglobin stable at 9-10.  No signs or symptoms of bleeding.  14 on admission, suspect hemoconcentration, prior hemoglobins closer to 10-11 -Monitor CBC -Continue Venofer for total of 5 days -Start oral iron supplementation on discharge  Hypoglycemia in setting of diminished oral intake later to esophagitis, no recurrent hypoglycemic episodes -Currently on D5 normal saline IV fluids -mom will trial oral diet  Hypophosphatemia likely also with a to diminished oral intake -Replete phosphorus as necessary -Monitor phosphorus level daily  Hypomagnesemia.  Not currently have any diarrhea potassium within normal limits. -Replete IV, repeat mag in a.m.  Chronic constipation.  Normal abdominal exam.  Abdominal x-ray nonobstructive pattern. -optimize bowel regimen, closely monitor      Family Communication  : Mother updated at bedside  Code Status : Full  Disposition Plan  :  Patient is from home. Anticipated d/c date: 2 to 3 days. Barriers to d/c or necessity for inpatient status:  Currently requiring IV supplementation with Diflucan for esophageal candidiasis, IV Protonix for esophagitis, aggressive repletion of magnesium/phosphorus/potassium in setting of diminished oral intake related to esophagitis.  Will trial oral diet, if no improvement in 24 hours will likely consult palliative care Consults  : Gastroenterology  Procedures  :    EGD, 03/10/2020:  DVT  Prophylaxis  :  Lovenox   Lab Results  Component Value Date   PLT 177 03/12/2020    Diet :  Diet Order            DIET SOFT Room service appropriate? Yes; Fluid consistency: Thin  Diet effective now               Inpatient Medications Scheduled Meds: . doxazosin  2 mg Oral QHS  . enoxaparin (LOVENOX) injection  40 mg Subcutaneous Q24H  . feeding supplement (ENSURE ENLIVE)  237 mL Oral TID BM  . fluticasone  2 spray Each Nare Daily  . loratadine  10 mg Oral Daily  . methenamine  1 g Oral BID WC  . pantoprazole (PROTONIX) IV  40 mg Intravenous Q12H  . polyethylene glycol  17 g Oral BID  . senna-docusate  2 tablet Oral BID  . sucralfate  1 g Oral TID WC & HS  . traZODone  50 mg Oral QHS   Continuous Infusions: . dextrose 5 % and 0.45% NaCl 125 mL/hr at 03/12/20 1019  . fluconazole (DIFLUCAN) IV 200 mg (03/12/20 1019)   PRN Meds:.acetaminophen **OR** acetaminophen, morphine injection, ondansetron **OR** ondansetron (ZOFRAN) IV, traZODone  Antibiotics  :   Anti-infectives (From admission, onward)   Start     Dose/Rate Route Frequency Ordered Stop   03/09/20 1400  fluconazole (DIFLUCAN) IVPB 200 mg    Note to Pharmacy: We will switch to oral once patient will start taking oral medications   200 mg 100 mL/hr over 60 Minutes Intravenous Every 24 hours 03/09/20 1144 03/23/20 0959   03/07/20 0800  methenamine (MANDELAMINE) tablet 1 g     1 g Oral 2 times daily with meals 03/06/20 2207         Objective   Vitals:   03/12/20 0342 03/12/20 0807 03/12/20 1055 03/12/20 1332  BP: (!) 110/38 (!) 105/58  123/67  Pulse: (!) 132 (!) 118  (!) 121  Resp: 17 18  17   Temp: 99.8 F (37.7 C) (!) 100.4 F (38 C) 99.3 F (37.4 C) 98.2 F (36.8 C)  TempSrc: Axillary Axillary Axillary Axillary  SpO2: 100% 93%  98%  Weight:      Height:        SpO2: 98 % O2 Flow Rate (L/min): 0 L/min  Wt Readings from Last 3 Encounters:  03/06/20 47.6 kg  03/02/20 52.2 kg  02/24/20 52.2 kg       Intake/Output Summary (Last 24 hours) at 03/12/2020 1529 Last data filed at 03/12/2020 1237 Gross per 24 hour  Intake 2219.09 ml  Output 1850 ml  Net 369.09 ml    Physical Exam:    Vocalizing incomprehensibly during exam (baseline per mother at bedside), Awake, opens eyes to voice, tracks appropriately No new F.N deficits,  [place].AT, Normal respiratory effort on room air, CTAB Tachycardic no Gallops,Rubs or new Murmurs,  +ve B.Sounds, Abd Soft, No tenderness, No rebound, guarding or rigidity. No Cyanosis, No new Rash or bruise    I have personally reviewed the following:   Data Reviewed:  CBC Recent Labs  Lab 03/09/20 0654 03/10/20 0557 03/11/20 0347 03/11/20 1233 03/12/20 0747  WBC 4.5 4.3 6.4 4.5 4.7  HGB 10.2* 10.4* 9.8* 9.2* 10.3*  HCT 32.0* 31.4* 32.0* 29.6* 32.4*  PLT 139* 265 260 233 177  MCV 92.2 91.5 95.8 95.8 92.8  MCH 29.4 30.3 29.3 29.8 29.5  MCHC 31.9 33.1 30.6 31.1 31.8  RDW 14.6 14.6  14.6 15.2 15.6*  LYMPHSABS  --   --  1.9  --   --   MONOABS  --   --  0.4  --   --   EOSABS  --   --  0.2  --   --   BASOSABS  --   --  0.0  --   --     Chemistries  Recent Labs  Lab 03/06/20 1621 03/06/20 1621 03/07/20 [id number] 03/07/20 [id number] 03/08/20 0536 03/08/20 0536 03/09/20 [id number] 03/10/20 0625 03/10/20 1551 03/11/20 0347 03/12/20 0747  NA 147*   < > 151*   < > 149*   < > 142 138 139 142 143  K 3.9   < > 2.9*   < > 3.4*   < > 3.1* 3.1* 4.1 4.5 4.6  CL 110   < > 123*   < > 120*   < > 110 105 105 111 109  CO2 12*   < > 18*   < > 19*   < > 24 27 27 26 29   GLUCOSE 126*   < > 89   < > 60*   < > 96 109* 129* 115* 93  BUN 12   < > 7   < > <5*   < > <5* <5* <5* <5* <5*  CREATININE 0.90   < > 0.46   < > <0.30*   < > 0.30* 0.37* 0.43* 0.31* <0.30*  CALCIUM 10.1   < > 8.0*   < > 8.2*   < > 8.3* 8.2* 8.4* 8.4* 8.1*  MG  --   --   --   --  1.8  --  1.5* 1.9  --  1.6* 1.9  AST 23  --  17  --   --   --   --   --   --  25  --   ALT 19  --  14  --   --   --   --    --   --  15  --   ALKPHOS 68  --  39  --   --   --   --   --   --  45  --   BILITOT 1.7*  --  0.9  --   --   --   --   --   --  0.4  --    < > = values in this interval not displayed.   ------------------------------------------------------------------------------------------------------------------ No results for input(s): CHOL, HDL, LDLCALC, TRIG, CHOLHDL, LDLDIRECT in the last 72 hours.  Lab Results  Component Value Date   HGBA1C 4.9 07/29/2019   ------------------------------------------------------------------------------------------------------------------ Recent Labs    03/11/20 0347  TSH 1.452   ------------------------------------------------------------------------------------------------------------------ No results for input(s): VITAMINB12, FOLATE, FERRITIN, TIBC, IRON, RETICCTPCT in the last 72 hours.  Coagulation profile No results for input(s): INR, PROTIME in the last 168 hours.  No results for input(s): DDIMER in the last 72 hours.  Cardiac Enzymes No results for input(s): CKMB, TROPONINI, MYOGLOBIN in the last 168 hours.  Invalid input(s): CK ------------------------------------------------------------------------------------------------------------------ No results found for: BNP  Micro Results Recent Results (from the past 240 hour(s))  KOH prep     Status: None   Collection Time: 03/08/20 11:30 AM   Specimen: Esophagus  Result Value Ref Range Status   Specimen Description ESOPHAGUS  Final   Special Requests NONE  Final   KOH Prep   Final    BUDDING YEAST SEEN Performed at [employer]  [facility], [address] [place], [place] [address]    Report Status 03/08/2020 FINAL  Final  CULTURE, BLOOD (ROUTINE X 2) w Reflex to ID Panel     Status: Abnormal (Preliminary result)   Collection Time: 03/11/20  2:03 AM   Specimen: BLOOD  Result Value Ref Range Status   Specimen Description   Final    BLOOD BLOOD RIGHT FOREARM Performed at [facility], [address], [place], [place] [address]    Special Requests   Final    BOTTLES DRAWN AEROBIC AND ANAEROBIC Blood Culture adequate volume Performed at [facility], [address] [place], [place] [address]    Culture  Setup Time   Final    ANAEROBIC BOTTLE ONLY GRAM POSITIVE COCCI Organism ID to follow CRITICAL RESULT CALLED TO, READ BACK BY AND VERIFIED WITH: ALEX CHAPPELL @1727  03/11/20 MJU Performed at [facility], [address] [place], [place] [address]    Culture (A)  Final    STAPHYLOCOCCUS SPECIES (COAGULASE NEGATIVE) THE SIGNIFICANCE OF ISOLATING THIS ORGANISM FROM A SINGLE SET OF BLOOD CULTURES WHEN MULTIPLE SETS ARE DRAWN IS UNCERTAIN. PLEASE NOTIFY THE MICROBIOLOGY DEPARTMENT WITHIN ONE WEEK IF SPECIATION AND SENSITIVITIES ARE REQUIRED. Performed at [facility], [place] [address] [place], [place] [address]    Report Status PENDING  Incomplete  Blood Culture ID Panel (Reflexed)     Status: Abnormal   Collection Time: 03/11/20  2:03 AM  Result Value Ref Range Status   Enterococcus species NOT DETECTED NOT DETECTED Final   Listeria monocytogenes NOT DETECTED NOT DETECTED Final   Staphylococcus species DETECTED (A) NOT DETECTED Final    Comment: Methicillin (oxacillin) susceptible coagulase negative staphylococcus. Possible blood culture contaminant (unless isolated from more than one blood culture draw or clinical case suggests pathogenicity). No antibiotic treatment is indicated for blood  culture contaminants. CRITICAL RESULT CALLED TO, READ BACK BY AND VERIFIED WITH: ALEX CHAPPELL @1727  03/11/20 MJU    Staphylococcus aureus (BCID) NOT DETECTED NOT DETECTED Final   Methicillin resistance NOT DETECTED NOT DETECTED Final   Streptococcus species NOT DETECTED NOT DETECTED Final   Streptococcus agalactiae NOT DETECTED NOT DETECTED Final   Streptococcus pneumoniae NOT DETECTED NOT DETECTED Final   Streptococcus pyogenes NOT DETECTED NOT  DETECTED Final   Acinetobacter baumannii NOT DETECTED NOT DETECTED Final   Enterobacteriaceae species NOT DETECTED NOT DETECTED Final   Enterobacter cloacae complex NOT DETECTED NOT DETECTED Final   Escherichia coli NOT DETECTED NOT DETECTED Final   Klebsiella oxytoca NOT DETECTED NOT DETECTED Final   Klebsiella pneumoniae NOT DETECTED NOT DETECTED Final   Proteus species NOT DETECTED NOT DETECTED Final   Serratia marcescens NOT DETECTED NOT DETECTED Final   Haemophilus influenzae NOT DETECTED NOT DETECTED Final   Neisseria meningitidis NOT DETECTED NOT DETECTED Final   Pseudomonas aeruginosa NOT DETECTED NOT DETECTED Final   Candida albicans NOT DETECTED NOT DETECTED Final   Candida glabrata NOT DETECTED NOT DETECTED Final   Candida krusei NOT DETECTED NOT DETECTED Final   Candida parapsilosis NOT DETECTED NOT DETECTED Final   Candida tropicalis NOT DETECTED NOT DETECTED Final    Comment: Performed at [facility], [address] [place], [place] [address]  CULTURE, BLOOD (ROUTINE X 2) w Reflex to ID Panel     Status: None (Preliminary result)   Collection Time: 03/11/20  3:47 AM   Specimen: BLOOD  Result Value Ref Range Status   Specimen Description BLOOD BLOOD  RIGHT HAND  Final   Special Requests   Final    BOTTLES DRAWN AEROBIC AND ANAEROBIC Blood Culture results may not be optimal due to an inadequate volume of blood received in culture bottles   Culture   Final    NO GROWTH 1 DAY Performed at [facility], [address], [place], [place] [address]    Report Status PENDING  Incomplete    Radiology Reports DG Chest 1 View  Result Date: 03/11/2020 CLINICAL DATA:  Sepsis EXAM: CHEST  1 VIEW COMPARISON:  This 03/02/2020 FINDINGS: The heart size and mediastinal contours are within normal limits. Both lungs are clear. Dextroscoliosis with incompletely visualized spinal rods. Gas distended colon is visible in the upper abdomen. IMPRESSION: No active  cardiopulmonary disease. Electronically Signed   By: Ulyses Jarred M.D.   On: 03/11/2020 01:30   DG Chest 1 View  Result Date: 03/02/2020 CLINICAL DATA:  Cough, vomiting EXAM: CHEST  1 VIEW COMPARISON:  02/13/2020, 02/25/2020 FINDINGS: Single frontal view of the chest demonstrates stable spinal fusion rods. Continued right convex scoliosis. Cardiac silhouette is unchanged. No airspace disease, effusion, or pneumothorax. Chronic degenerative changes right shoulder. No acute fractures. IMPRESSION: 1. No acute intrathoracic process. Electronically Signed   By: Randa Ngo M.D.   On: 03/02/2020 17:25   DG Abdomen 1 View  Result Date: 03/06/2020 CLINICAL DATA:  Nausea and vomiting EXAM: ABDOMEN - 1 VIEW COMPARISON:  02/13/2020 FINDINGS: Postsurgical changes are noted in the thoracolumbar spine with stable scoliosis concave to the right at the thoracolumbar junction. Scattered large and small bowel gas is noted. No obstructive changes are seen. No free air is noted. No abnormal mass or abnormal calcifications are seen. IMPRESSION: No acute abnormality noted. Electronically Signed   By: Inez Catalina M.D.   On: 03/06/2020 19:20   CT ABDOMEN PELVIS W CONTRAST  Result Date: 03/02/2020 CLINICAL DATA:  Nausea and vomiting EXAM: CT ABDOMEN AND PELVIS WITH CONTRAST TECHNIQUE: Multidetector CT imaging of the abdomen and pelvis was performed using the standard protocol following bolus administration of intravenous contrast. CONTRAST:  62mL OMNIPAQUE IOHEXOL 300 MG/ML  SOLN COMPARISON:  CT 02/25/2020, 08/03/2014 FINDINGS: Lower chest: Lung bases demonstrate atelectasis at the left base. No consolidation or pleural effusion. Moderate hiatal hernia Hepatobiliary: Central hepatic cysts. No calcified gallstone or biliary dilatation. Pancreas: Unremarkable. No pancreatic ductal dilatation or surrounding inflammatory changes. Spleen: Normal in size without focal abnormality. Adrenals/Urinary Tract: Adrenal glands are  unremarkable. Kidneys are normal, without renal calculi, focal lesion, or hydronephrosis. Bladder is unremarkable. Stomach/Bowel: Stomach is within normal limits. Appendix not clearly identified but no right lower quadrant inflammatory process. No evidence of bowel wall thickening, distention, or inflammatory changes. Vascular/Lymphatic: Nonaneurysmal aorta. No suspicious adenopathy. Reproductive: No adnexal mass. Other: Negative for free air or free fluid. Musculoskeletal: Scoliosis of the spine with thoracolumbar spinal rods. Bilateral acetabular dysplasia. No acute osseous abnormality. IMPRESSION: 1. No CT evidence for acute intra-abdominal or pelvic abnormality. 2. Moderate hiatal hernia. Electronically Signed   By: Donavan Foil M.D.   On: 03/02/2020 17:48   CT ABDOMEN PELVIS W CONTRAST  Result Date: 02/25/2020 CLINICAL DATA:  Nausea and vomiting with acute abdominal pain EXAM: CT ABDOMEN AND PELVIS WITH CONTRAST TECHNIQUE: Multidetector CT imaging of the abdomen and pelvis was performed using the standard protocol following bolus administration of intravenous contrast. CONTRAST:  21mL OMNIPAQUE IOHEXOL 300 MG/ML  SOLN COMPARISON:  08/03/2014 FINDINGS: Lower chest:  Moderate sliding hiatal hernia Hepatobiliary: Small cystic  density in the central liver.No evidence of biliary obstruction or stone. Pancreas: Unremarkable. Spleen: Unremarkable. Adrenals/Urinary Tract: Negative adrenals. No hydronephrosis or stone. Unremarkable bladder. Stomach/Bowel: No obstruction. No appendicitis. Moderate desiccated stool, but nonobstructive. Vascular/Lymphatic: No acute vascular abnormality. No mass or adenopathy. Reproductive:No pathologic findings. Other: No ascites or pneumoperitoneum. Musculoskeletal: No acute abnormalities. Scoliosis and hip dysplasia. Marked muscular atrophy. No acute osseous finding. IMPRESSION: 1. No acute finding. Negative for bowel obstruction or visible inflammation. 2. Hiatal hernia.  Electronically Signed   By: Monte Fantasia M.D.   On: 02/25/2020 04:14   DG Abdomen Acute W/Chest  Result Date: 02/13/2020 CLINICAL DATA:  Abdominal pain and vomiting. COVID positive 1 week ago. EXAM: DG ABDOMEN ACUTE W/ 1V CHEST COMPARISON:  Chest radiograph 12/09/2014. FINDINGS: Unchanged heart size and mediastinal contours, normal anatomy distorted by scoliotic curvature of the thoracolumbar spine. There is streaky retrocardiac opacities at the left lung base. No other focal airspace disease. No pulmonary edema or large pleural effusion. Remote left clavicle fracture. Chronic changes about the right shoulder. Air throughout nondilated small and large bowel in the abdomen without evidence of obstruction. No free air. Small volume of stool in the transverse and sigmoid colon. No visualized radiopaque calculi or abnormal soft tissue calcifications. Scoliotic curvature of the spine with Harrington rod fixation hardware in place. Bones are under mineralized. IMPRESSION: 1. Streaky retrocardiac opacities at the left lung base, atelectasis versus pneumonia. 2. Normal bowel gas pattern without obstruction.  No free air. Electronically Signed   By: Keith Rake M.D.   On: 02/13/2020 15:05   DG Abd Portable 1V  Result Date: 03/12/2020 CLINICAL DATA:  Tachycardia, nausea. EXAM: PORTABLE ABDOMEN - 1 VIEW COMPARISON:  Plain film of the abdomen dated 03/06/2020. FINDINGS: Overall bowel gas pattern is nonobstructive. No evidence of soft tissue mass or abnormal fluid collection. No evidence of free intraperitoneal air. No evidence of renal or ureteral calculi. Fixation hardware seen traversing the scoliotic thoracolumbar spine, stable. IMPRESSION: No acute findings. Nonobstructive bowel gas pattern. Electronically Signed   By: Franki Cabot M.D.   On: 03/12/2020 08:43   US Abdomen Limited RUQ  Result Date: 03/06/2020 CLINICAL DATA:  Vomiting for 1 month EXAM: ULTRASOUND ABDOMEN LIMITED RIGHT UPPER QUADRANT  COMPARISON:  03/02/2020 FINDINGS: Gallbladder: No gallstones or wall thickening visualized. No sonographic Murphy sign noted by sonographer. Common bile duct: Diameter: 2 mm (assessment is however markedly limited by patient body habitus. Area felt to represent the common bile duct is measured and is anterior to portal venous structures. These areas are not well evaluated given limitations of the current exam. Liver: Small cysts in the liver are similar to recent CT evaluation. The liver is incompletely assessed due to overlying: Along the RIGHT hepatic lobe, bowel gas in the midline also limiting assessment of midline structures. Portal vein is patent on color Doppler imaging with normal direction of blood flow towards the liver. Again assessment of the porta hepatis is limited by body habitus. Other: No ascites. IMPRESSION: 1. Limited evaluation due to body habitus and overlying bowel gas as described. 2. Within the limitations of the current study no ultrasound evidence of acute cholecystitis. Electronically Signed   By: Zetta Bills M.D.   On: 03/06/2020 17:49     Time Spent in minutes  30     Desiree Hane M.D on 03/12/2020 at 3:29 PM  To page go to [email or web address] - password [facility]

## 2020-03-12 NOTE — Plan of Care (Signed)
  Problem: Elimination: Goal: Will not experience complications related to bowel motility 03/12/2020 1130 by Verdene Rio, RN Outcome: Completed/Met 03/12/2020 0722 by Verdene Rio, RN Outcome: Progressing

## 2020-03-12 NOTE — Plan of Care (Signed)
  Problem: Clinical Measurements: Goal: Ability to maintain clinical measurements within normal limits will improve Outcome: Progressing Goal: Cardiovascular complication will be avoided Outcome: Progressing   Problem: Pain Managment: Goal: General experience of comfort will improve Outcome: Progressing

## 2020-03-12 NOTE — Plan of Care (Signed)
  Problem: Clinical Measurements: Goal: Cardiovascular complication will be avoided Outcome: Progressing   Problem: Nutrition: Goal: Adequate nutrition will be maintained Outcome: Progressing   Problem: Elimination: Goal: Will not experience complications related to urinary retention Outcome: Progressing

## 2020-03-12 NOTE — Plan of Care (Signed)
  Problem: Clinical Measurements: Goal: Ability to maintain clinical measurements within normal limits will improve Outcome: Progressing Goal: Will remain free from infection Outcome: Progressing Goal: Cardiovascular complication will be avoided Outcome: Progressing   Problem: Nutrition: Goal: Adequate nutrition will be maintained Outcome: Progressing   Problem: Elimination: Goal: Will not experience complications related to bowel motility Outcome: Progressing   Problem: Pain Managment: Goal: General experience of comfort will improve Outcome: Progressing

## 2020-03-13 ENCOUNTER — Inpatient Hospital Stay: Payer: Medicare Other

## 2020-03-13 DIAGNOSIS — G809 Cerebral palsy, unspecified: Secondary | ICD-10-CM

## 2020-03-13 DIAGNOSIS — R05 Cough: Secondary | ICD-10-CM

## 2020-03-13 DIAGNOSIS — K209 Esophagitis, unspecified without bleeding: Secondary | ICD-10-CM

## 2020-03-13 DIAGNOSIS — Z515 Encounter for palliative care: Secondary | ICD-10-CM

## 2020-03-13 DIAGNOSIS — R509 Fever, unspecified: Secondary | ICD-10-CM

## 2020-03-13 DIAGNOSIS — R110 Nausea: Secondary | ICD-10-CM

## 2020-03-13 DIAGNOSIS — B3781 Candidal esophagitis: Principal | ICD-10-CM

## 2020-03-13 DIAGNOSIS — Z7189 Other specified counseling: Secondary | ICD-10-CM

## 2020-03-13 LAB — BASIC METABOLIC PANEL
Anion gap: 8 (ref 5–15)
BUN: 6 mg/dL (ref 6–20)
CO2: 29 mmol/L (ref 22–32)
Calcium: 9 mg/dL (ref 8.9–10.3)
Chloride: 107 mmol/L (ref 98–111)
Creatinine, Ser: 0.4 mg/dL — ABNORMAL LOW (ref 0.44–1.00)
GFR calc Af Amer: >60 mL/min (ref >60)
GFR calc non Af Amer: >60 mL/min (ref >60)
Glucose, Bld: 116 mg/dL — ABNORMAL HIGH (ref 70–99)
Potassium: 4.1 mmol/L (ref 3.5–5.1)
Sodium: 144 mmol/L (ref 135–145)

## 2020-03-13 LAB — CULTURE, BLOOD (ROUTINE X 2): Special Requests: ADEQUATE

## 2020-03-13 LAB — CBC
HCT: 29.5 % — ABNORMAL LOW (ref 36.0–46.0)
Hemoglobin: 8.8 g/dL — ABNORMAL LOW (ref 12.0–15.0)
MCH: 29.2 pg (ref 26.0–34.0)
MCHC: 29.8 g/dL — ABNORMAL LOW (ref 30.0–36.0)
MCV: 98 fL (ref 80.0–100.0)
Platelets: 223 10*3/uL (ref 150–400)
RBC: 3.01 MIL/uL — ABNORMAL LOW (ref 3.87–5.11)
RDW: 15.6 % — ABNORMAL HIGH (ref 11.5–15.5)
WBC: 5.6 10*3/uL (ref 4.0–10.5)
nRBC: 0 % (ref 0.0–0.2)

## 2020-03-13 MED ORDER — MORPHINE SULFATE (PF) 2 MG/ML IV SOLN
2.0000 mg | Freq: Once | INTRAVENOUS | Status: AC
  Administered 2020-03-13: 2 mg via INTRAVENOUS
  Filled 2020-03-13: qty 1

## 2020-03-13 MED ORDER — TRAMADOL HCL 50 MG PO TABS
50.0000 mg | ORAL_TABLET | Freq: Four times a day (QID) | ORAL | Status: DC | PRN
  Administered 2020-03-13: 50 mg via ORAL
  Filled 2020-03-13 (×2): qty 1

## 2020-03-13 NOTE — Progress Notes (Signed)
   03/13/20 1015  Assess: MEWS Score  Level of Consciousness Alert  Assess: MEWS Score  MEWS Temp 1  MEWS Systolic 0  MEWS Pulse 1  MEWS RR 0  MEWS LOC 0  MEWS Score 2  MEWS Score Color Yellow  Assess: if the MEWS score is Yellow or Red  Were vital signs taken at a resting state? Yes  Focused Assessment Documented focused assessment  Early Detection of Sepsis Score *See Row Information* Low  MEWS guidelines implemented *See Row Information* No, previously yellow, continue vital signs every 4 hours  Pt continues with yellow MEWs this shift.

## 2020-03-13 NOTE — Care Management Important Message (Signed)
Important Message  Patient Details  Name: Loretta Savage MRN: [id number] Date of Birth: 08-26-1966   Medicare Important Message Given:  Yes     Dannette Barbara 03/13/2020, 3:13 PM

## 2020-03-13 NOTE — Progress Notes (Addendum)
SLP Cancellation Note  Patient Details Name: Loretta Savage MRN: [id number] DOB: [birth year]   Cancelled treatment:       Reason Eval/Treat Not Completed: Fatigue/lethargy limiting ability to participate;Patient's level of consciousness(chart reviewed; consulted NSG then met w/ Mother). Pt recently given meds for pain; now sleeping. ST services will f/u in the morning for BSE.  Talked w/ Mother re: pt's swallowing; Mother aware of pt's dx'd Hiatal Hernia and Esophagitis with bleeding w/ Esophageal candidiasis found on EGD. Discussed how these issues can impact swallowing, oral intake. Pt is being treated now by MD/GI. Recommend general aspiration precautions; Pills in puree as needed. Diet modified to mech soft w/ Minced meats, gravies added to moisten well.     Orinda Kenner, [place], CCC-SLP Perri Aragones 03/13/2020, 3:29 PM

## 2020-03-13 NOTE — Progress Notes (Signed)
[facility]  PROGRESS NOTE  Loretta Savage [id number] DOB: [birth year] DOA: 03/06/2020 PCP: Olin Hauser, DO Admit date - 03/06/2020   Admitting Physician Sidney Ace, MD  Outpatient Primary MD for the patient is Olin Hauser, DO  LOS - 6 Brief Narrative   Loretta Savage is a 54 y.o. year old female with medical history significant for cerebral palsy, GERD, anxiety who presented on 03/06/2020 with several weeks of intractable nausea and vomiting and was found to have esophagitis with bleeding complicated by esophageal candidiasis confirmed on EGD with biopsy. Hospital course complicated by diminished oral intake with resultant hypophosphatemia, hypoglycemia, sinus tachycardia, hypotension.    Subjective  Today mother reports concern with persistent cough particularly after eating.  She states that she has had some Ensure but tends to cough right after.  Denies any emesis overnight.  A & P   Persistent sinus tachycardia, suspect related to dehydration, improving.  Heart rate stable in the 110s to 120s.  Other work-up unremarkable (within normal limits TSH, hemoglobin stable, blood pressure responsive to IV fluids) -Continue IV fluids -Continue to monitor heart rate -Encourage oral hydration (soft diet) safely with assistance, aspiration precautions  Hypotension, improving.  Blood pressure now 112/56 much improved from being in the low 80s a few days prior responding to maintenance fluids.  Suspect related to dehydration given stable hemoglobin, slight lactic acidosis of 2.2.  Procalcitonin is 1.26 so infection is a possibility but could also be elevated in the setting of inflammation related to her esophagitis/esophageal candidiasis, she did have a slight fever 100.5 however UA, chest x-ray, blood cultures remain unremarkable without leukocytosis we will continue to hold off on antibiotics and closely monitor  -Continue IV fluids -Monitor blood  cultures  1 of 4 blood cultures growing GPC (methicillin sensitive).  Suspect contamination given patient has more likely explanation for hypotension/tachycardia. -Hold off any antibiotics currently suspect skin contamination -follow repeat blood cultures  Esophageal candidiasis.  Concerning findings on EGD.  Biopsy showed budding yeast -Continue IV Diflucan until able to tolerate p.o.   Esophagitis with bleeding found on EGD.  Hemoglobin remained stable, not having any recurrent nausea or vomiting. Intractable nausea and vomiting, resolved -Continue IV PPI until able to tolerate p.o. -On discharge need to continue Prilosec 40 mg p.o. twice daily for 2 months with repeat EGD in 2 months to check healing. -Return to GI office in 1 month with Dr. Alice Reichert  Normocytic anemia, assist with iron deficiency hemoglobin stable at 9-10.  No signs or symptoms of bleeding.  14 on admission, suspect hemoconcentration, prior hemoglobins closer to 9-11 -Monitor CBC -Completed course of Injectafer for 5 days -Start oral iron supplementation on discharge  Hypoglycemia in setting of diminished oral intake related to esophagitis, no recurrent hypoglycemic episodes, resolved -Discontinue D5 1/2normal saline IV fluids -Monitor on soft diet  Hypophosphatemia likely also with a to diminished oral intake -Replete phosphorus as necessary -Monitor phosphorus level daily  Hypomagnesemia.  Not currently have any diarrhea potassium within normal limits. -Replete IV, repeat mag in a.m.  Chronic constipation.  Normal abdominal exam.  Abdominal x-ray nonobstructive pattern. -optimize bowel regimen, closely monitor      Family Communication  : Mother updated at bedside  Code Status : Full  Disposition Plan  :  Patient is from home. Anticipated d/c date: 2 to 3 days. Barriers to d/c or necessity for inpatient status:  Currently requiring IV supplementation with Diflucan for esophageal candidiasis, IV Protonix  for esophagitis, aggressive repletion of magnesium/phosphorus/potassium in setting of diminished oral intake related to esophagitis.  High concern for aspiration, continue aspiration precautions, trial oral diet Consults  : Gastroenterology, palliative  Procedures  :    EGD, 03/10/2020:  DVT Prophylaxis  :  Lovenox   Lab Results  Component Value Date   PLT 223 03/13/2020    Diet :  Diet Order            DIET SOFT Room service appropriate? Yes; Fluid consistency: Thin  Diet effective now               Inpatient Medications Scheduled Meds: . doxazosin  2 mg Oral QHS  . enoxaparin (LOVENOX) injection  40 mg Subcutaneous Q24H  . feeding supplement (ENSURE ENLIVE)  237 mL Oral TID BM  . fluticasone  2 spray Each Nare Daily  . loratadine  10 mg Oral Daily  . methenamine  1 g Oral BID WC  . pantoprazole (PROTONIX) IV  40 mg Intravenous Q12H  . polyethylene glycol  17 g Oral BID  . senna-docusate  2 tablet Oral BID  . sucralfate  1 g Oral TID WC & HS  . traZODone  50 mg Oral QHS   Continuous Infusions: . dextrose 5 % and 0.45% NaCl 125 mL/hr at 03/13/20 1215  . fluconazole (DIFLUCAN) IV 200 mg (03/13/20 1036)   PRN Meds:.acetaminophen **OR** acetaminophen, morphine injection, ondansetron **OR** ondansetron (ZOFRAN) IV, traMADol, traZODone  Antibiotics  :   Anti-infectives (From admission, onward)   Start     Dose/Rate Route Frequency Ordered Stop   03/09/20 1400  fluconazole (DIFLUCAN) IVPB 200 mg    Note to Pharmacy: We will switch to oral once patient will start taking oral medications   200 mg 100 mL/hr over 60 Minutes Intravenous Every 24 hours 03/09/20 1144 03/23/20 0959   03/07/20 0800  methenamine (MANDELAMINE) tablet 1 g     1 g Oral 2 times daily with meals 03/06/20 2207         Objective   Vitals:   03/13/20 0001 03/13/20 0402 03/13/20 1128 03/13/20 1737  BP: (!) 112/56 (!) 119/40 (!) 117/52 125/75  Pulse: (!) 115 (!) 110 (!) 113 (!) 120  Resp: 18 16 18  18   Temp: (!) 100.4 F (38 C) (!) 100.5 F (38.1 C) 98.2 F (36.8 C) 98.2 F (36.8 C)  TempSrc: Axillary Axillary Axillary   SpO2: 97% 96% 100% 99%  Weight:      Height:        SpO2: 99 % O2 Flow Rate (L/min): 0 L/min  Wt Readings from Last 3 Encounters:  03/06/20 47.6 kg  03/02/20 52.2 kg  02/24/20 52.2 kg     Intake/Output Summary (Last 24 hours) at 03/13/2020 1745 Last data filed at 03/13/2020 1047 Gross per 24 hour  Intake 480 ml  Output 4800 ml  Net -4320 ml    Physical Exam:    Awake, opens eyes to voice, tracks appropriately No new F.N deficits,  [place].AT, Normal respiratory effort on room air, CTAB Tachycardic no Gallops,Rubs or new Murmurs,  +ve B.Sounds, Abd Soft, No tenderness, No rebound, guarding or rigidity. No Cyanosis, No new Rash or bruise    I have personally reviewed the following:   Data Reviewed:  CBC Recent Labs  Lab 03/10/20 0557 03/11/20 0347 03/11/20 1233 03/12/20 0747 03/13/20 0334  WBC 4.3 6.4 4.5 4.7 5.6  HGB 10.4* 9.8* 9.2* 10.3* 8.8*  HCT 31.4* 32.0* 29.6*  32.4* 29.5*  PLT 265 260 233 177 223  MCV 91.5 95.8 95.8 92.8 98.0  MCH 30.3 29.3 29.8 29.5 29.2  MCHC 33.1 30.6 31.1 31.8 29.8*  RDW 14.6 14.6 15.2 15.6* 15.6*  LYMPHSABS  --  1.9  --   --   --   MONOABS  --  0.4  --   --   --   EOSABS  --  0.2  --   --   --   BASOSABS  --  0.0  --   --   --     Chemistries  Recent Labs  Lab 03/07/20 0613 03/07/20 0613 03/08/20 0536 03/08/20 0536 03/09/20 0628 03/09/20 0628 03/10/20 0625 03/10/20 1551 03/11/20 0347 03/12/20 0747 03/13/20 0334  NA 151*   < > 149*   < > 142   < > 138 139 142 143 144  K 2.9*   < > 3.4*   < > 3.1*   < > 3.1* 4.1 4.5 4.6 4.1  CL 123*   < > 120*   < > 110   < > 105 105 111 109 107  CO2 18*   < > 19*   < > 24   < > 27 27 26 29 29   GLUCOSE 89   < > 60*   < > 96   < > 109* 129* 115* 93 116*  BUN 7   < > <5*   < > <5*   < > <5* <5* <5* <5* 6  CREATININE 0.46   < > <0.30*   < > 0.30*   < > 0.37*  0.43* 0.31* <0.30* 0.40*  CALCIUM 8.0*   < > 8.2*   < > 8.3*   < > 8.2* 8.4* 8.4* 8.1* 9.0  MG  --   --  1.8  --  1.5*  --  1.9  --  1.6* 1.9  --   AST 17  --   --   --   --   --   --   --  25  --   --   ALT 14  --   --   --   --   --   --   --  15  --   --   ALKPHOS 39  --   --   --   --   --   --   --  45  --   --   BILITOT 0.9  --   --   --   --   --   --   --  0.4  --   --    < > = values in this interval not displayed.   ------------------------------------------------------------------------------------------------------------------ No results for input(s): CHOL, HDL, LDLCALC, TRIG, CHOLHDL, LDLDIRECT in the last 72 hours.  Lab Results  Component Value Date   HGBA1C 4.9 07/29/2019   ------------------------------------------------------------------------------------------------------------------ Recent Labs    03/11/20 0347  TSH 1.452   ------------------------------------------------------------------------------------------------------------------ No results for input(s): VITAMINB12, FOLATE, FERRITIN, TIBC, IRON, RETICCTPCT in the last 72 hours.  Coagulation profile No results for input(s): INR, PROTIME in the last 168 hours.  No results for input(s): DDIMER in the last 72 hours.  Cardiac Enzymes No results for input(s): CKMB, TROPONINI, MYOGLOBIN in the last 168 hours.  Invalid input(s): CK ------------------------------------------------------------------------------------------------------------------ No results found for: BNP  Micro Results Recent Results (from the past 240 hour(s))  KOH prep     Status: None   Collection Time: 03/08/20  11:30 AM   Specimen: Esophagus  Result Value Ref Range Status   Specimen Description ESOPHAGUS  Final   Special Requests NONE  Final   KOH Prep   Final    BUDDING YEAST SEEN Performed at [facility], [address] [place], [place] [address]    Report Status 03/08/2020 FINAL  Final  CULTURE, BLOOD (ROUTINE X 2)  w Reflex to ID Panel     Status: Abnormal   Collection Time: 03/11/20  2:03 AM   Specimen: BLOOD  Result Value Ref Range Status   Specimen Description   Final    BLOOD BLOOD RIGHT FOREARM Performed at [facility], [address], [place], [place] [address]    Special Requests   Final    BOTTLES DRAWN AEROBIC AND ANAEROBIC Blood Culture adequate volume Performed at [facility], [address] [place], [place] [address]    Culture  Setup Time   Final    ANAEROBIC BOTTLE ONLY GRAM POSITIVE COCCI Organism ID to follow CRITICAL RESULT CALLED TO, READ BACK BY AND VERIFIED WITH: [place] @1727  03/11/20 MJU Performed at [facility], [address] [place], [place] [address]    Culture (A)  Final    STAPHYLOCOCCUS SPECIES (COAGULASE NEGATIVE) THE SIGNIFICANCE OF ISOLATING THIS ORGANISM FROM A SINGLE SET OF BLOOD CULTURES WHEN MULTIPLE SETS ARE DRAWN IS UNCERTAIN. PLEASE NOTIFY THE MICROBIOLOGY DEPARTMENT WITHIN ONE WEEK IF SPECIATION AND SENSITIVITIES ARE REQUIRED. Performed at [facility], [place] [address] [place], [place] [address]    Report Status 03/13/2020 FINAL  Final  Blood Culture ID Panel (Reflexed)     Status: Abnormal   Collection Time: 03/11/20  2:03 AM  Result Value Ref Range Status   Enterococcus species NOT DETECTED NOT DETECTED Final   Listeria monocytogenes NOT DETECTED NOT DETECTED Final   Staphylococcus species DETECTED (A) NOT DETECTED Final    Comment: Methicillin (oxacillin) susceptible coagulase negative staphylococcus. Possible blood culture contaminant (unless isolated from more than one blood culture draw or clinical case suggests pathogenicity). No antibiotic treatment is indicated for blood  culture contaminants. CRITICAL RESULT CALLED TO, READ BACK BY AND VERIFIED WITH: [place] @1727  03/11/20 MJU    Staphylococcus aureus (BCID) NOT DETECTED NOT DETECTED Final   Methicillin resistance NOT DETECTED NOT DETECTED  Final   Streptococcus species NOT DETECTED NOT DETECTED Final   Streptococcus agalactiae NOT DETECTED NOT DETECTED Final   Streptococcus pneumoniae NOT DETECTED NOT DETECTED Final   Streptococcus pyogenes NOT DETECTED NOT DETECTED Final   Acinetobacter baumannii NOT DETECTED NOT DETECTED Final   Enterobacteriaceae species NOT DETECTED NOT DETECTED Final   Enterobacter cloacae complex NOT DETECTED NOT DETECTED Final   Escherichia coli NOT DETECTED NOT DETECTED Final   Klebsiella oxytoca NOT DETECTED NOT DETECTED Final   Klebsiella pneumoniae NOT DETECTED NOT DETECTED Final   Proteus species NOT DETECTED NOT DETECTED Final   Serratia marcescens NOT DETECTED NOT DETECTED Final   Haemophilus influenzae NOT DETECTED NOT DETECTED Final   Neisseria meningitidis NOT DETECTED NOT DETECTED Final   Pseudomonas aeruginosa NOT DETECTED NOT DETECTED Final   Candida albicans NOT DETECTED NOT DETECTED Final   Candida glabrata NOT DETECTED NOT DETECTED Final   Candida krusei NOT DETECTED NOT DETECTED Final   Candida parapsilosis NOT DETECTED NOT DETECTED Final   Candida tropicalis NOT DETECTED NOT DETECTED Final    Comment: Performed at [facility], [address], [place],  [address]  CULTURE,  BLOOD (ROUTINE X 2) w Reflex to ID Panel     Status: None (Preliminary result)   Collection Time: 03/11/20  3:47 AM   Specimen: BLOOD  Result Value Ref Range Status   Specimen Description BLOOD BLOOD RIGHT HAND  Final   Special Requests   Final    BOTTLES DRAWN AEROBIC AND ANAEROBIC Blood Culture results may not be optimal due to an inadequate volume of blood received in culture bottles   Culture   Final    NO GROWTH 2 DAYS Performed at [facility], [address], [place], [place] [address]    Report Status PENDING  Incomplete    Radiology Reports DG Chest 1 View  Result Date: 03/11/2020 CLINICAL DATA:  Sepsis EXAM: CHEST  1 VIEW COMPARISON:  This 03/02/2020 FINDINGS: The  heart size and mediastinal contours are within normal limits. Both lungs are clear. Dextroscoliosis with incompletely visualized spinal rods. Gas distended colon is visible in the upper abdomen. IMPRESSION: No active cardiopulmonary disease. Electronically Signed   By: Ulyses Jarred M.D.   On: 03/11/2020 01:30   DG Chest 1 View  Result Date: 03/02/2020 CLINICAL DATA:  Cough, vomiting EXAM: CHEST  1 VIEW COMPARISON:  02/13/2020, 02/25/2020 FINDINGS: Single frontal view of the chest demonstrates stable spinal fusion rods. Continued right convex scoliosis. Cardiac silhouette is unchanged. No airspace disease, effusion, or pneumothorax. Chronic degenerative changes right shoulder. No acute fractures. IMPRESSION: 1. No acute intrathoracic process. Electronically Signed   By: Randa Ngo M.D.   On: 03/02/2020 17:25   DG Abdomen 1 View  Result Date: 03/06/2020 CLINICAL DATA:  Nausea and vomiting EXAM: ABDOMEN - 1 VIEW COMPARISON:  02/13/2020 FINDINGS: Postsurgical changes are noted in the thoracolumbar spine with stable scoliosis concave to the right at the thoracolumbar junction. Scattered large and small bowel gas is noted. No obstructive changes are seen. No free air is noted. No abnormal mass or abnormal calcifications are seen. IMPRESSION: No acute abnormality noted. Electronically Signed   By: Inez Catalina M.D.   On: 03/06/2020 19:20   CT ABDOMEN PELVIS W CONTRAST  Result Date: 03/02/2020 CLINICAL DATA:  Nausea and vomiting EXAM: CT ABDOMEN AND PELVIS WITH CONTRAST TECHNIQUE: Multidetector CT imaging of the abdomen and pelvis was performed using the standard protocol following bolus administration of intravenous contrast. CONTRAST:  20mL OMNIPAQUE IOHEXOL 300 MG/ML  SOLN COMPARISON:  CT 02/25/2020, 08/03/2014 FINDINGS: Lower chest: Lung bases demonstrate atelectasis at the left base. No consolidation or pleural effusion. Moderate hiatal hernia Hepatobiliary: Central hepatic cysts. No calcified gallstone  or biliary dilatation. Pancreas: Unremarkable. No pancreatic ductal dilatation or surrounding inflammatory changes. Spleen: Normal in size without focal abnormality. Adrenals/Urinary Tract: Adrenal glands are unremarkable. Kidneys are normal, without renal calculi, focal lesion, or hydronephrosis. Bladder is unremarkable. Stomach/Bowel: Stomach is within normal limits. Appendix not clearly identified but no right lower quadrant inflammatory process. No evidence of bowel wall thickening, distention, or inflammatory changes. Vascular/Lymphatic: Nonaneurysmal aorta. No suspicious adenopathy. Reproductive: No adnexal mass. Other: Negative for free air or free fluid. Musculoskeletal: Scoliosis of the spine with thoracolumbar spinal rods. Bilateral acetabular dysplasia. No acute osseous abnormality. IMPRESSION: 1. No CT evidence for acute intra-abdominal or pelvic abnormality. 2. Moderate hiatal hernia. Electronically Signed   By: Donavan Foil M.D.   On: 03/02/2020 17:48   CT ABDOMEN PELVIS W CONTRAST  Result Date: 02/25/2020 CLINICAL DATA:  Nausea and vomiting with acute abdominal pain EXAM: CT ABDOMEN AND PELVIS WITH CONTRAST TECHNIQUE: Multidetector CT  imaging of the abdomen and pelvis was performed using the standard protocol following bolus administration of intravenous contrast. CONTRAST:  2mL OMNIPAQUE IOHEXOL 300 MG/ML  SOLN COMPARISON:  08/03/2014 FINDINGS: Lower chest:  Moderate sliding hiatal hernia Hepatobiliary: Small cystic density in the central liver.No evidence of biliary obstruction or stone. Pancreas: Unremarkable. Spleen: Unremarkable. Adrenals/Urinary Tract: Negative adrenals. No hydronephrosis or stone. Unremarkable bladder. Stomach/Bowel: No obstruction. No appendicitis. Moderate desiccated stool, but nonobstructive. Vascular/Lymphatic: No acute vascular abnormality. No mass or adenopathy. Reproductive:No pathologic findings. Other: No ascites or pneumoperitoneum. Musculoskeletal: No acute  abnormalities. Scoliosis and hip dysplasia. Marked muscular atrophy. No acute osseous finding. IMPRESSION: 1. No acute finding. Negative for bowel obstruction or visible inflammation. 2. Hiatal hernia. Electronically Signed   By: Monte Fantasia M.D.   On: 02/25/2020 04:14   DG Chest Port 1 View  Result Date: 03/13/2020 CLINICAL DATA:  Cough and fevers EXAM: PORTABLE CHEST 1 VIEW COMPARISON:  03/11/2020 FINDINGS: Cardiac shadow is enlarged but stable. The overall inspiratory effort has improved. No focal confluent infiltrate is seen. Scoliosis with prior fixation rods is noted. IMPRESSION: Overall improved aeration.  No focal infiltrate is noted. Electronically Signed   By: Inez Catalina M.D.   On: 03/13/2020 08:31   DG Abdomen Acute W/Chest  Result Date: 02/13/2020 CLINICAL DATA:  Abdominal pain and vomiting. COVID positive 1 week ago. EXAM: DG ABDOMEN ACUTE W/ 1V CHEST COMPARISON:  Chest radiograph 12/09/2014. FINDINGS: Unchanged heart size and mediastinal contours, normal anatomy distorted by scoliotic curvature of the thoracolumbar spine. There is streaky retrocardiac opacities at the left lung base. No other focal airspace disease. No pulmonary edema or large pleural effusion. Remote left clavicle fracture. Chronic changes about the right shoulder. Air throughout nondilated small and large bowel in the abdomen without evidence of obstruction. No free air. Small volume of stool in the transverse and sigmoid colon. No visualized radiopaque calculi or abnormal soft tissue calcifications. Scoliotic curvature of the spine with Harrington rod fixation hardware in place. Bones are under mineralized. IMPRESSION: 1. Streaky retrocardiac opacities at the left lung base, atelectasis versus pneumonia. 2. Normal bowel gas pattern without obstruction.  No free air. Electronically Signed   By: Keith Rake M.D.   On: 02/13/2020 15:05   DG Abd Portable 1V  Result Date: 03/12/2020 CLINICAL DATA:  Tachycardia,  nausea. EXAM: PORTABLE ABDOMEN - 1 VIEW COMPARISON:  Plain film of the abdomen dated 03/06/2020. FINDINGS: Overall bowel gas pattern is nonobstructive. No evidence of soft tissue mass or abnormal fluid collection. No evidence of free intraperitoneal air. No evidence of renal or ureteral calculi. Fixation hardware seen traversing the scoliotic thoracolumbar spine, stable. IMPRESSION: No acute findings. Nonobstructive bowel gas pattern. Electronically Signed   By: Franki Cabot M.D.   On: 03/12/2020 08:43   US Abdomen Limited RUQ  Result Date: 03/06/2020 CLINICAL DATA:  Vomiting for 1 month EXAM: ULTRASOUND ABDOMEN LIMITED RIGHT UPPER QUADRANT COMPARISON:  03/02/2020 FINDINGS: Gallbladder: No gallstones or wall thickening visualized. No sonographic Murphy sign noted by sonographer. Common bile duct: Diameter: 2 mm (assessment is however markedly limited by patient body habitus. Area felt to represent the common bile duct is measured and is anterior to portal venous structures. These areas are not well evaluated given limitations of the current exam. Liver: Small cysts in the liver are similar to recent CT evaluation. The liver is incompletely assessed due to overlying: Along the RIGHT hepatic lobe, bowel gas in the midline also limiting assessment of midline structures.  Portal vein is patent on color Doppler imaging with normal direction of blood flow towards the liver. Again assessment of the porta hepatis is limited by body habitus. Other: No ascites. IMPRESSION: 1. Limited evaluation due to body habitus and overlying bowel gas as described. 2. Within the limitations of the current study no ultrasound evidence of acute cholecystitis. Electronically Signed   By: Zetta Bills M.D.   On: 03/06/2020 17:49     Time Spent in minutes  30     Desiree Hane M.D on 03/13/2020 at 5:45 PM  To page go to [email or web address] - password [email or web address]

## 2020-03-13 NOTE — Consult Note (Signed)
Consultation Note Date: 03/13/2020   Patient Name: Loretta Savage  DOB: 02-Oct-1966  MRN: [id number]  Age / Sex: 54 y.o., female  PCP: Loretta Hauser, DO Referring Physician: Desiree Hane, MD  Reason for Consultation: Establishing goals of care  HPI/Patient Profile: 54 y.o. female  with past medical history of cerebral palsy, GERD, anxiety admitted on 03/06/2020 with intractable nausea and vomiting. GI following. EGD performed and found to have esophagitis with bleeding complicated by esophageal candidiasis. Medium sized hiatal hernia. On IV PPI and IV Diflucan. Outpatient GI recommended with repeat EGD in 2 months. Course of hospitalization complicated by poor oral intake, aspiration risk, and persistent tachycardia. Palliative medicine consultation for goals of care.   Clinical Assessment and Goals of Care:  I have reviewed medical records, discussed with Dr. Lonny Savage and RN and met patient and mother/legal guardian Loretta Savage) at bedside to discuss goals of care.   Upon arrival to room, patient appears to be in acute distress with continuous moaning. Spit up tramadol that was attempted to be given this AM. She was recently given IV Morphine 23m with no relief of pain or discomfort. Instructed RN to given another 269mIV morphine now.   Per Loretta Spillerspatient's pain was previously controlled on IV morphine but through the weekend, dose was adjusted and patient did not require as much morphine. Loretta Spillerseports that CyUnited Stationershas been moaning and uncomfortable majority of the afternoon. RN to bring morphine once pharmacy approves.   Introduced Palliative Medicine as specialized medical care for people living with serious illness. It focuses on providing relief from the symptoms and stress of a serious illness. The goal is to improve quality of life for both the patient and the family.  We discussed a  brief life review of the patient. Loretta Spillerss Loretta Savage's mother, legal guardian and primary caregiver. Baseline, Loretta Reichmannas CP and MR with minimal verbal communication (just a few words). Prior to admission, able to ambulate with assist and braces but majority of time is spent in her wheelchair. Caregiver support in the home. Attends group [facility] activities during the week [facility]. Botox injections approximately every 3 months.   Discussed events leading up to admission and course of hospitalization including diagnoses, interventions, plan of care. Discussed concern with ongoing poor oral intake, dehydration, and aspiration risk. Discussed pending SLP evaluation but that Loretta Reichmannill likely not be able to participate today with agitation and pain.   I attempted to elicit values and goals of care important to patient's mother. Briefly explored Loretta Savage's thoughts on artificial nutrition/feeding tube placement versus comfort feeds. Loretta Spillersods her head no to feeding tube, sharing this would mean Loretta Savage would need feeding tube long-term if it came to that. Loretta Spillersuickly changes the subject and attempts to calm Loretta Reichmannown again.   Emotional support provided. Loretta Savage started to calm down by the end of my visit. Morphine seems to be helping. PMT contact information given and reassured of ongoing support.  SUMMARY OF RECOMMENDATIONS    Continue full code/full scope treatment.  Ongoing [place] discussions. Patient in acute distress during visit and symptom management was priority. Difficult to have [place] with mother at bedside.   Symptom management--see below.  SLP evaluation pending. Doubt patient will be able to participate today. Hopefully 5/18.   PMT will follow.   Code Status/Advance Care Planning:  Full code  Symptom Management:   Continue Morphine 83m IV q4h prn severe pain  Continue Tramadol 567mPO q6h prn moderate pain  Trazadone 5077mO HS sleep  Consider  low-dose ativan/xanax for anxiety/agitation  Senna BID, MIralax BID   Carafate 1g PO TID   Diflucan daily IVPB  Palliative Prophylaxis:   Aspiration, Bowel Regimen, Delirium Protocol, Frequent Pain Assessment, Oral Care and Turn Reposition  Psycho-social/Spiritual:   Desire for further Chaplaincy support:yes  Additional Recommendations: Caregiving  Support/Resources  Prognosis:   Guarded  Discharge Planning: To Be Determined      Primary Diagnoses: Present on Admission: . Intractable nausea and vomiting . Esophagitis . Esophageal candidiasis ([place] I have reviewed the medical record, interviewed the patient and family, and examined the patient. The following aspects are pertinent.  Past Medical History:  Diagnosis Date  . Anxiety   . Cerebral palsy ([place] . Chronic kidney disease    stone  . Colon polyp   . GERD (gastroesophageal reflux disease)   . Mentally challenged   . Sleep apnea   . Urinary incontinence    Social History   Socioeconomic History  . Marital status: Single    Spouse name: Not on file  . Number of children: Not on file  . Years of education: Vocational Program  . Highest education level: Associate degree: occupational, tecHotel managerr vocational program  Occupational History  . Not on file  Tobacco Use  . Smoking status: Never Smoker  . Smokeless tobacco: Never Used  Substance and Sexual Activity  . Alcohol use: Never  . Drug use: Never  . Sexual activity: Never  Other Topics Concern  . Not on file  Social History Narrative  . Not on file   Social Determinants of Health   Financial Resource Strain:   . Difficulty of Paying Living Expenses:   Food Insecurity:   . Worried About RunCharity fundraiser the Last Year:   . RanArboriculturist the Last Year:   Transportation Needs:   . LacFilm/video editoredical):   . LMarland Kitchenck of Transportation (Non-Medical):   Physical Activity:   . Days of Exercise per Week:   . Minutes of  Exercise per Session:   Stress:   . Feeling of Stress :   Social Connections:   . Frequency of Communication with Friends and Family:   . Frequency of Social Gatherings with Friends and Family:   . Attends Religious Services:   . Active Member of Clubs or Organizations:   . Attends CluArchivistetings:   . MMarland Kitchenrital Status:    Family History  Problem Relation Age of Onset  . Colon polyps Mother   . Heart disease Father   . Heart attack Father   . Thyroid disease Maternal Grandmother   . Heart disease Maternal Grandfather   . Heart disease Paternal Grandmother   . Heart disease Paternal Grandfather    Scheduled Meds: . doxazosin  2 mg Oral QHS  . enoxaparin (LOVENOX) injection  40 mg Subcutaneous Q24H  . feeding supplement (ENSURE ENLIVE)  237 mL Oral TID BM  . fluticasone  2 spray Each Nare Daily  . loratadine  10 mg Oral Daily  . methenamine  1 g Oral BID WC  . pantoprazole (PROTONIX) IV  40 mg Intravenous Q12H  . polyethylene glycol  17 g Oral BID  . senna-docusate  2 tablet Oral BID  . sucralfate  1 g Oral TID WC & HS  . traZODone  50 mg Oral QHS   Continuous Infusions: . dextrose 5 % and 0.45% NaCl 125 mL/hr at 03/13/20 1215  . fluconazole (DIFLUCAN) IV 200 mg (03/13/20 1036)   PRN Meds:.acetaminophen **OR** acetaminophen, morphine injection, ondansetron **OR** ondansetron (ZOFRAN) IV, traMADol, traZODone Medications Prior to Admission:  Prior to Admission medications   Medication Sig Start Date End Date Taking? Authorizing Provider  acetaminophen (TYLENOL) 500 MG tablet Take 1,000 mg by mouth every 6 (six) hours as needed.    Yes [provider]  aspirin 81 MG EC tablet Take 81 mg by mouth daily.    Yes [provider]  bisacodyl (MAGIC BULLETS) 10 MG suppository Place 10 mg rectally daily as needed.    Yes [provider]  Calcium-Vitamin D 600-200 MG-UNIT tablet Take 1 tablet by mouth daily.    Yes [provider]    Cholecalciferol (D2000 ULTRA STRENGTH) 50 MCG (2000 UT) CAPS Take 1 capsule by mouth daily.    Yes [provider]  Dexlansoprazole (DEXILANT) 30 MG capsule 30 mg daily. 11/27/15  Yes [provider]  docusate sodium (COLACE CLEAR) 50 MG capsule Take 50 mg by mouth daily.    Yes [provider]  doxazosin (CARDURA) 2 MG tablet TAKE 1 TABLET (2 MG TOTAL) BY MOUTH AT BEDTIME. FOR BLADDER. 03/22/19  Yes Karamalegos, Devonne Doughty, DO  fluticasone (FLONASE) 50 MCG/ACT nasal spray SPRAY 1 SPRAY INTO EACH NOSTRIL TWICE A DAY 02/15/20  Yes Karamalegos, Devonne Doughty, DO  levocetirizine (XYZAL) 5 MG tablet Take 5 mg by mouth daily. 11/08/17  Yes [provider]  loratadine (CLARITIN) 10 MG tablet Take 10 mg by mouth daily as needed.  06/19/12  Yes [provider]  meloxicam (MOBIC) 15 MG tablet TAKE 1 TABLET BY MOUTH DAILY FOR ARTHRITIS 02/10/20  Yes Karamalegos, Devonne Doughty, DO  methenamine (HIPREX) 1 g tablet TAKE 1 TABLET (1 G TOTAL) BY MOUTH 2 (TWO) TIMES DAILY WITH A MEAL. 08/05/19  Yes Karamalegos, Devonne Doughty, DO  ondansetron (ZOFRAN ODT) 4 MG disintegrating tablet Take 1-2 tablets (4-8 mg total) by mouth every 8 (eight) hours as needed for nausea or vomiting. 02/23/20  Yes Karamalegos, Alexander J, DO  ondansetron (ZOFRAN ODT) 4 MG disintegrating tablet Take 1 tablet (4 mg total) by mouth every 8 (eight) hours as needed. 02/25/20  Yes Gregor Hams, MD  pantoprazole (PROTONIX) 20 MG tablet Take 1 tablet (20 mg total) by mouth 2 (two) times daily. 02/25/20 03/26/20 Yes Gregor Hams, MD  promethazine (PHENERGAN) 12.5 MG suppository Place 1 suppository (12.5 mg total) rectally every 8 (eight) hours as needed for nausea or vomiting. 02/23/20  Yes Karamalegos, Devonne Doughty, DO  sucralfate (CARAFATE) 1 g tablet TAKE 1 TABLET BY MOUTH AT 10:30AM AND 1 TABLET AT BEDTIME EVERY DAY 01/05/18  Yes [provider]  traZODone (DESYREL) 50 MG tablet TAKE 1 TABLET (50 MG  TOTAL) BY MOUTH AT BEDTIME. FOR SLEEP 11/16/19  Yes Karamalegos, Devonne Doughty, DO  ALPRAZolam (XANAX) 0.5 MG tablet TAKE 1 TABLET (0.5 MG TOTAL) BY MOUTH  2 (TWO) TIMES DAILY. FOR ANXIETY Patient not taking: Reported on 02/11/2020 08/25/19   Loretta Hauser, DO  lidocaine-prilocaine (EMLA) cream  09/07/18   [provider]  traMADol (ULTRAM) 50 MG tablet Take 50 mg by mouth daily as needed.    [provider]   Allergies  Allergen Reactions  . Neomycin-Bacitracin Zn-Polymyx     Other reaction(s): HIVES   . Hydrocortisone Rash  . Levofloxacin Rash    Other reaction(s): SWELLING Other reaction(s): SWELLING Other reaction(s): SWELLING Other reaction(s): SWELLING   . Penicillins Rash    Other reaction(s): HIVES Other reaction(s): HIVES Other reaction(s): HIVES Other reaction(s): HIVES    Review of Systems  Unable to perform ROS: Patient nonverbal    Physical Exam Vitals and nursing note reviewed.  Constitutional:      General: She is in acute distress.     Appearance: She is ill-appearing.  HENT:     Head: Normocephalic and atraumatic.  Cardiovascular:     Rate and Rhythm: Normal rate.  Pulmonary:     Effort: No tachypnea, accessory muscle usage or respiratory distress.  Abdominal:     Tenderness: There is no abdominal tenderness.  Skin:    General: Skin is warm and dry.     Coloration: Skin is pale.  Neurological:     Comments: Baseline CP, non-verbal. During visit, moaning in acute distress/pain.   Psychiatric:        Speech: She is noncommunicative.        Cognition and Memory: Cognition is impaired.     Vital Signs: BP (!) 117/52 (BP Location: Right Leg)   Pulse (!) 113   Temp 98.2 F (36.8 C) (Axillary)   Resp 18   Ht 4' 5"  (1.346 m)   Wt 47.6 kg   LMP  (LMP Unknown)   SpO2 100%   BMI 26.28 kg/m  Pain Scale: Faces POSS *See Group Information*: 1-Acceptable,Awake and alert Pain Score: 10-Worst pain ever   SpO2: SpO2: 100  % O2 Device:SpO2: 100 % O2 Flow Rate: .O2 Flow Rate (L/min): 0 L/min  IO: Intake/output summary:   Intake/Output Summary (Last 24 hours) at 03/13/2020 1445 Last data filed at 03/13/2020 1047 Gross per 24 hour  Intake 1390.92 ml  Output 5275 ml  Net -3884.08 ml    LBM: Last BM Date: 03/12/20 Baseline Weight: Weight: 47.6 kg Most recent weight: Weight: 47.6 kg     Palliative Assessment/Data: PPS 30%     Time In: 1330 Time Out: 1445 Time Total: 83mn Greater than 50%  of this time was spent counseling and coordinating care related to the above assessment and plan.  Signed by:  MIhor Dow DNP, FNP-C Palliative Medicine Team  Phone: [phone]: [phone]  Please contact Palliative Medicine Team phone at [phone] questions and concerns.  For individual provider: See AShea Evans

## 2020-03-14 LAB — CBC
HCT: 30 % — ABNORMAL LOW (ref 36.0–46.0)
Hemoglobin: 9.2 g/dL — ABNORMAL LOW (ref 12.0–15.0)
MCH: 29.7 pg (ref 26.0–34.0)
MCHC: 30.7 g/dL (ref 30.0–36.0)
MCV: 96.8 fL (ref 80.0–100.0)
Platelets: 218 10*3/uL (ref 150–400)
RBC: 3.1 MIL/uL — ABNORMAL LOW (ref 3.87–5.11)
RDW: 15.9 % — ABNORMAL HIGH (ref 11.5–15.5)
WBC: 6.6 10*3/uL (ref 4.0–10.5)
nRBC: 0 % (ref 0.0–0.2)

## 2020-03-14 LAB — BASIC METABOLIC PANEL
Anion gap: 5 (ref 5–15)
BUN: <5 mg/dL — ABNORMAL LOW (ref 6–20)
CO2: 29 mmol/L (ref 22–32)
Calcium: 8.7 mg/dL — ABNORMAL LOW (ref 8.9–10.3)
Chloride: 104 mmol/L (ref 98–111)
Creatinine, Ser: 0.32 mg/dL — ABNORMAL LOW (ref 0.44–1.00)
GFR calc Af Amer: >60 mL/min (ref >60)
GFR calc non Af Amer: >60 mL/min (ref >60)
Glucose, Bld: 98 mg/dL (ref 70–99)
Potassium: 4.2 mmol/L (ref 3.5–5.1)
Sodium: 138 mmol/L (ref 135–145)

## 2020-03-14 MED ORDER — PHENOL 1.4 % MT LIQD
1.0000 | OROMUCOSAL | Status: DC | PRN
  Filled 2020-03-14: qty 177

## 2020-03-14 NOTE — Progress Notes (Signed)
Daily Progress Note   Patient Name: Loretta Savage       Date: 03/14/2020 DOB: 26-Dec-1965  Age: 54 y.o. MRN#: [id number] Attending Physician: Desiree Hane, MD Primary Care Physician: Olin Hauser, DO Admit Date: 03/06/2020  Reason for Consultation/Follow-up: Establishing goals of care  Subjective: Patient sleeping during visit. No s/s of pain or discomfort.   GOC:  F/u with patient and mother, Loretta Savage at bedside. Patient is much more comfortable compared to visit yesterday. Mother reports she had a restful night and that speech is planning to evaluate her at 12:30.   Discussed events leading up to hospitalization and course of hospitalization including diagnoses, interventions, plan of care, and provider recommendations. Loretta Savage is hopeful for improvement from medication regimen for candidiasis and is planning for outpatient GI follow-up and eventually repeat EGD. Discussed pending SLP evaluation and concern with ongoing dysphagia/aspiration risk.   I attempted to elicit values and goals of care important to the patient and her mother. Advanced directives, concepts specific to code status, artifical feeding and hydration were discussed. Introduced MOST form. Loretta Savage shares at this point, she does desire full code/full scope treatment for Loretta Savage. Loretta Savage would wish for resuscitation to be attempted and short-term intubation/mechanically ventilation. She further explains that she would not wish for prolonged ventilator support if Loretta Savage was not improving within a week or without meaningful quality of life (sent to a nursing home to lay there).   We discussed feeding tubes and medical recommendation against feeding tube for Loretta Savage due to ongoing risk for aspiration, infection, and not  necessarily giving her a better quality of life. Loretta Savage shares her understanding that this would just "prolong" her life for which I agreed with. Loretta Savage shares her conversation with Dr. Dwyane Dee and how aggressive CPR is on one's body versus allowing a natural death. Further educated on code blue scenario and this would be aggressive and painful if Loretta Savage required it. Hard Choices booklet given for review. Encouraged Loretta Savage to consider these decisions moving forward, explaining fear that she will eventually be faced with decision regarding feeding tube versus comfort feeds accepting aspiration risk.    Palliative Care services outpatient were explained and offered. Mother agreeable and appreciative of the visit. Mother has PMT contact information. Questions addressed.   Length of Stay: 7  Current Medications: Scheduled  Meds:  . doxazosin  2 mg Oral QHS  . enoxaparin (LOVENOX) injection  40 mg Subcutaneous Q24H  . feeding supplement (ENSURE ENLIVE)  237 mL Oral TID BM  . fluticasone  2 spray Each Nare Daily  . loratadine  10 mg Oral Daily  . methenamine  1 g Oral BID WC  . pantoprazole (PROTONIX) IV  40 mg Intravenous Q12H  . polyethylene glycol  17 g Oral BID  . senna-docusate  2 tablet Oral BID  . sucralfate  1 g Oral TID WC & HS  . traZODone  50 mg Oral QHS    Continuous Infusions: . dextrose 5 % and 0.45% NaCl 125 mL/hr at 03/14/20 0329  . fluconazole (DIFLUCAN) IV Stopped (03/13/20 1140)    PRN Meds: acetaminophen **OR** acetaminophen, morphine injection, ondansetron **OR** ondansetron (ZOFRAN) IV, traMADol, traZODone  Physical Exam Vitals and nursing note reviewed.  Constitutional:      General: She is sleeping.  Cardiovascular:     Heart sounds: Normal heart sounds.  Pulmonary:     Effort: No tachypnea, accessory muscle usage or respiratory distress.  Skin:    General: Skin is warm and dry.  Neurological:     Comments: Sleeping, baseline non-verbal with CP/MR              Vital Signs: BP 96/63 (BP Location: Left Arm)   Pulse (!) 115   Temp 97.6 F (36.4 C) (Oral)   Resp 20   Ht 4\' 5"  (1.346 m)   Wt 47.6 kg   LMP  (LMP Unknown)   SpO2 99%   BMI 26.28 kg/m  SpO2: SpO2: 99 % O2 Device: O2 Device: Room Air O2 Flow Rate: O2 Flow Rate (L/min): 0 L/min  Intake/output summary:   Intake/Output Summary (Last 24 hours) at 03/14/2020 1145 Last data filed at 03/14/2020 0751 Gross per 24 hour  Intake 1620 ml  Output 1150 ml  Net 470 ml   LBM: Last BM Date: 03/12/20 Baseline Weight: Weight: 47.6 kg Most recent weight: Weight: 47.6 kg       Palliative Assessment/Data: PPS 40%    Flowsheet Rows     Most Recent Value  Intake Tab  Referral Department  Hospitalist  Unit at Time of Referral  Med/Surg Unit  Date Notified  03/10/20  Palliative Care Type  New Palliative care  Reason for referral  Clarify Goals of Care  Date of Admission  03/06/20  Date first seen by Palliative Care  03/13/20  # of days Palliative referral response time  3 Day(s)  # of days IP prior to Palliative referral  4  Clinical Assessment  Psychosocial & Spiritual Assessment  Palliative Care Outcomes      Patient Active Problem List   Diagnosis Date Noted  . Cerebral palsy ([place])   . Palliative care by specialist   . Goals of care, counseling/discussion   . Iron deficiency anemia due to chronic blood loss 03/11/2020  . Hypophosphatemia 03/11/2020  . Esophagitis 03/11/2020  . Esophageal candidiasis ([place]) 03/11/2020  . Hypotension 03/11/2020  . Sinus tachycardia 03/11/2020  . Hypomagnesemia 03/11/2020  . Intractable nausea and vomiting 03/06/2020  . Scoliosis 11/23/2019  . Bowel and bladder incontinence 11/23/2019  . 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 Anxiety 02/04/2018  . Insomnia 02/04/2018  . 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 GERD (gastroesophageal reflux disease) 02/04/2018  .  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  Seasonal allergies 02/04/2018  . Chronic interstitial cystitis 02/04/2018    Palliative Care Assessment & Plan   Patient Profile: 54 y.o. female  with past medical history of cerebral palsy, GERD, anxiety admitted on 03/06/2020 with intractable nausea and vomiting. GI following. EGD performed and found to have esophagitis with bleeding complicated by esophageal candidiasis. Medium sized hiatal hernia. On IV PPI and IV Diflucan. Outpatient GI recommended with repeat EGD in 2 months. Course of hospitalization complicated by poor oral intake, aspiration risk, and persistent tachycardia. Palliative medicine consultation for goals of care.   Assessment: Hx of cerebral palsy Intractable nausea/vomiting Esophageal candidiasis Esophagitis  Hiatal hernia Hypotension improving Persistent sinus tachycardia improving  Recommendations/Plan:  Further GOC discussion with mother/legal guardian at bedside Lattie Corns).  Mother does desire full code/full scope treatment. She would wish for resuscitation to be attempted and short-term intubation/mechanical ventilation if necessary. Mother would not wish for prolonged interventions (mechanical ventilation/trach) and seems to be leaning against feeding tube placement if it came to this. Encouraged ongoing consideration of her goals and wishes for [place]. Encouraged she read Hard Choices booklet.   Continue current plan of care and medical management.  Outpatient GI follow-up.  Pending SLP evaluation.   Mother agreeable with outpatient palliative referral. Notified Dr. Lonny Prude to add to discharge summary. Also notified TOC team.   Goals of Care and Additional Recommendations:  Limitations on Scope of Treatment: Full Scope Treatment  Code Status: FULL   Code Status Orders  (From admission, onward)         Start     Ordered   03/06/20 2200  Full code  Continuous     03/06/20 2207          Code Status History    This patient has a current code status but no historical code status.   Advance Care Planning Activity    Advance Directive Documentation     Most Recent Value  Type of Advance Directive  Healthcare Power of Attorney  Pre-existing out of facility DNR order (yellow form or pink MOST form)  --  "MOST" Form in Place?  --       Prognosis:   Unable to determine  Discharge Planning:  To Be Determined  Care plan was discussed with mother Loretta Savage), updated Dr. Lonny Prude via secure chat  Thank you for allowing the Palliative Medicine Team to assist in the care of this patient.   Time In: 1105 Time Out: 1145 Total Time 40 Prolonged Time Billed  no      Greater than 50%  of this time was spent counseling and coordinating care related to the above assessment and plan.  Ihor Dow, DNP, FNP-C Palliative Medicine Team  Phone: [phone] Fax: [phone]  Please contact Palliative Medicine Team phone at [phone] for questions and concerns.

## 2020-03-14 NOTE — Progress Notes (Signed)
   03/13/20 2018  Assess: MEWS Score  Temp 98.5 F (36.9 C)  BP 119/76  Pulse Rate (!) 109  Resp 16  SpO2 95 %  Assess: MEWS Score  MEWS Temp 0  MEWS Systolic 0  MEWS Pulse 1  MEWS RR 0  MEWS LOC 0  MEWS Score 1  MEWS Score Color Green  Assess: if the MEWS score is Yellow or Red  Were vital signs taken at a resting state? Yes  Focused Assessment Documented focused assessment  Early Detection of Sepsis Score *See Row Information* Low  MEWS guidelines implemented *See Row Information* No, previously yellow, continue vital signs every 4 hours

## 2020-03-14 NOTE — Plan of Care (Signed)
  Problem: Clinical Measurements: Goal: Ability to maintain clinical measurements within normal limits will improve Outcome: Progressing   Problem: Pain Managment: Goal: General experience of comfort will improve Outcome: Progressing Note: Patient slept most of the night, only moaned when she was soiled with urine, stopped moaning once she was cleaned (patient went back to sleep).

## 2020-03-14 NOTE — Progress Notes (Signed)
Chart reviewed. Pt visited for over 30 minutes. Pt's mom was present. Several attempts to awaken Pt with tactile cues, ice rubbed on chest and face. Pt opened eyes briefly but not enough for swallow eval. As ST started to leave, Pt opened her eyes again but would not allow bolus of applesauce enter between her lips, pursing them. No further attemots made. Will reattempt tomorow in hopes of increased alertness. Discussed dysphagia precautions with mom, who was very appreciative.

## 2020-03-14 NOTE — Progress Notes (Signed)
[facility]  PROGRESS NOTE  Loretta Savage [id number] DOB: 06-11-1966 DOA: 03/06/2020 PCP: Olin Hauser, DO Admit date - 03/06/2020   Admitting Physician Sidney Ace, MD  Outpatient Primary MD for the patient is Olin Hauser, DO  LOS - 7 Brief Narrative   Loretta Savage is a 54 y.o. year old female with medical history significant for cerebral palsy, GERD, anxiety who presented on 03/06/2020 with several weeks of intractable nausea and vomiting and was found to have esophagitis with bleeding complicated by esophageal candidiasis confirmed on EGD with biopsy. Hospital course complicated by diminished oral intake with resultant hypophosphatemia, hypoglycemia, sinus tachycardia, hypotension.    Subjective  Today mother reports she seems much more comfortable today.  She has been sleeping most of the day.  A & P   Persistent sinus tachycardia, suspect related to dehydration given diminished oral intake.  Heart rate stable in the 110s to 120s.  Other work-up unremarkable (within normal limits TSH, hemoglobin stable, blood pressure responsive to IV fluids) -Continue IV fluids -Continue to monitor heart rate -Encourage oral hydration (soft diet) safely with assistance, aspiration precautions -Continue goals of care discussion, appreciate palliative care  Hypotension, improving.  Blood pressure  much improved from being in the low 80s a few days prior responding to maintenance fluids.  Suspect related to dehydration given stable hemoglobin, slight lactic acidosis of 2.2.  Procalcitonin is 1.26 so infection is a possibility but could also be elevated in the setting of inflammation related to her esophagitis/esophageal candidiasis, she did have a slight fever 100.5 however UA, chest x-ray, blood cultures remain unremarkable without leukocytosis we will continue to hold off on antibiotics and closely monitor  -Continue IV fluids -Monitor blood cultures  1  of 4 blood cultures growing GPC (methicillin sensitive).  Suspect contamination given patient has more likely explanation for hypotension/tachycardia. -Hold off any antibiotics currently suspect skin contamination -follow repeat blood cultures  Esophageal candidiasis.  Concerning findings on EGD.  Biopsy showed budding yeast -Continue IV Diflucan until able to tolerate p.o.   Esophagitis with bleeding found on EGD.  Hemoglobin remained stable, not having any recurrent nausea or vomiting. Intractable nausea and vomiting, resolved -Continue IV PPI until able to tolerate p.o. -On discharge need to continue Prilosec 40 mg p.o. twice daily for 2 months with repeat EGD in 2 months to check healing. -Return to GI office in 1 month with Dr. Alice Reichert  Normocytic anemia, assist with iron deficiency hemoglobin stable at 9-10.  No signs or symptoms of bleeding.  14 on admission, suspect hemoconcentration, prior hemoglobins closer to 9-11 -Monitor CBC -Completed course of Injectafer for 5 days -Start oral iron supplementation on discharge  Hypoglycemia in setting of diminished oral intake related to esophagitis, no recurrent hypoglycemic episodes, resolved -Discontinue D5 1/2normal saline IV fluids -Monitor on soft diet  Hypophosphatemia likely also with a to diminished oral intake -Replete phosphorus as necessary -Monitor phosphorus level daily  Hypomagnesemia.  Not currently have any diarrhea potassium within normal limits. -Replete IV, repeat mag in a.m.  Chronic constipation.  Normal abdominal exam.  Abdominal x-ray nonobstructive pattern. -optimize bowel regimen, closely monitor      Family Communication  : Mother updated at bedside  Code Status : Full  Disposition Plan  :  Patient is from home. Anticipated d/c date: 2 to 3 days. Barriers to d/c or necessity for inpatient status:  Currently requiring IV supplementation with Diflucan for esophageal candidiasis, IV Protonix for  esophagitis,  aggressive repletion of magnesium/phosphorus/potassium in setting of diminished oral intake related to esophagitis.  High concern for aspiration, continue aspiration precautions, trial oral diet Consults  : Gastroenterology, palliative  Procedures  :    EGD, 03/10/2020:  DVT Prophylaxis  :  Lovenox   Lab Results  Component Value Date   PLT 218 03/14/2020    Diet :  Diet Order            DIET DYS 3 Room service appropriate? Yes with Assist; Fluid consistency: Thin  Diet effective now               Inpatient Medications Scheduled Meds: . doxazosin  2 mg Oral QHS  . enoxaparin (LOVENOX) injection  40 mg Subcutaneous Q24H  . feeding supplement (ENSURE ENLIVE)  237 mL Oral TID BM  . fluticasone  2 spray Each Nare Daily  . loratadine  10 mg Oral Daily  . methenamine  1 g Oral BID WC  . pantoprazole (PROTONIX) IV  40 mg Intravenous Q12H  . polyethylene glycol  17 g Oral BID  . senna-docusate  2 tablet Oral BID  . sucralfate  1 g Oral TID WC & HS  . traZODone  50 mg Oral QHS   Continuous Infusions: . dextrose 5 % and 0.45% NaCl Stopped (03/14/20 1525)  . fluconazole (DIFLUCAN) IV 200 mg (03/14/20 1527)   PRN Meds:.acetaminophen **OR** acetaminophen, morphine injection, ondansetron **OR** ondansetron (ZOFRAN) IV, phenol, traMADol, traZODone  Antibiotics  :   Anti-infectives (From admission, onward)   Start     Dose/Rate Route Frequency Ordered Stop   03/09/20 1400  fluconazole (DIFLUCAN) IVPB 200 mg    Note to Pharmacy: We will switch to oral once patient will start taking oral medications   200 mg 100 mL/hr over 60 Minutes Intravenous Every 24 hours 03/09/20 1144 03/23/20 0959   03/07/20 0800  methenamine (MANDELAMINE) tablet 1 g     1 g Oral 2 times daily with meals 03/06/20 2207         Objective   Vitals:   03/14/20 1715 03/14/20 2028 03/14/20 2056 03/14/20 2107  BP: 120/90 (!) 83/66 (!) 101/50   Pulse: (!) 120 (!) 112 98 (!) 107  Resp: 18 14      Temp: 98.7 F (37.1 C) 99.9 F (37.7 C)    TempSrc: Oral Oral    SpO2: 100% 97%  99%  Weight:      Height:        SpO2: 99 % O2 Flow Rate (L/min): 0 L/min  Wt Readings from Last 3 Encounters:  03/06/20 47.6 kg  03/02/20 52.2 kg  02/24/20 52.2 kg     Intake/Output Summary (Last 24 hours) at 03/14/2020 2215 Last data filed at 03/14/2020 1717 Gross per 24 hour  Intake 2308.02 ml  Output 1950 ml  Net 358.02 ml    Physical Exam:    Sleeping comfortably, no distress No new F.N deficits,  .AT, Normal respiratory effort on room air, CTAB Tachycardic no Gallops,Rubs or new Murmurs,  +ve B.Sounds, Abd Soft, No tenderness, No rebound, guarding or rigidity. No Cyanosis, No new Rash or bruise    I have personally reviewed the following:   Data Reviewed:  CBC Recent Labs  Lab 03/11/20 0347 03/11/20 1233 03/12/20 0747 03/13/20 0334 03/14/20 0846  WBC 6.4 4.5 4.7 5.6 6.6  HGB 9.8* 9.2* 10.3* 8.8* 9.2*  HCT 32.0* 29.6* 32.4* 29.5* 30.0*  PLT 260 233 177 223 218  MCV 95.8  95.8 92.8 98.0 96.8  MCH 29.3 29.8 29.5 29.2 29.7  MCHC 30.6 31.1 31.8 29.8* 30.7  RDW 14.6 15.2 15.6* 15.6* 15.9*  LYMPHSABS 1.9  --   --   --   --   MONOABS 0.4  --   --   --   --   EOSABS 0.2  --   --   --   --   BASOSABS 0.0  --   --   --   --     Chemistries  Recent Labs  Lab 03/08/20 0536 03/08/20 0536 03/09/20 0628 03/09/20 0628 03/10/20 0625 03/10/20 0625 03/10/20 1551 03/11/20 0347 03/12/20 0747 03/13/20 0334 03/14/20 0846  NA 149*   < > 142   < > 138   < > 139 142 143 144 138  K 3.4*   < > 3.1*   < > 3.1*   < > 4.1 4.5 4.6 4.1 4.2  CL 120*   < > 110   < > 105   < > 105 111 109 107 104  CO2 19*   < > 24   < > 27   < > 27 26 29 29 29   GLUCOSE 60*   < > 96   < > 109*   < > 129* 115* 93 116* 98  BUN <5*   < > <5*   < > <5*   < > <5* <5* <5* 6 <5*  CREATININE <0.30*   < > 0.30*   < > 0.37*   < > 0.43* 0.31* <0.30* 0.40* 0.32*  CALCIUM 8.2*   < > 8.3*   < > 8.2*   < > 8.4*  8.4* 8.1* 9.0 8.7*  MG 1.8  --  1.5*  --  1.9  --   --  1.6* 1.9  --   --   AST  --   --   --   --   --   --   --  25  --   --   --   ALT  --   --   --   --   --   --   --  15  --   --   --   ALKPHOS  --   --   --   --   --   --   --  45  --   --   --   BILITOT  --   --   --   --   --   --   --  0.4  --   --   --    < > = values in this interval not displayed.   ------------------------------------------------------------------------------------------------------------------ No results for input(s): CHOL, HDL, LDLCALC, TRIG, CHOLHDL, LDLDIRECT in the last 72 hours.  Lab Results  Component Value Date   HGBA1C 4.9 07/29/2019   ------------------------------------------------------------------------------------------------------------------ No results for input(s): TSH, T4TOTAL, T3FREE, THYROIDAB in the last 72 hours.  Invalid input(s): FREET3 ------------------------------------------------------------------------------------------------------------------ No results for input(s): VITAMINB12, FOLATE, FERRITIN, TIBC, IRON, RETICCTPCT in the last 72 hours.  Coagulation profile No results for input(s): INR, PROTIME in the last 168 hours.  No results for input(s): DDIMER in the last 72 hours.  Cardiac Enzymes No results for input(s): CKMB, TROPONINI, MYOGLOBIN in the last 168 hours.  Invalid input(s): CK ------------------------------------------------------------------------------------------------------------------ No results found for: BNP  Micro Results Recent Results (from the past 240 hour(s))  KOH prep     Status: None   Collection Time:  03/08/20 11:30 AM   Specimen: Esophagus  Result Value Ref Range Status   Specimen Description ESOPHAGUS  Final   Special Requests NONE  Final   KOH Prep   Final    BUDDING YEAST SEEN Performed at [facility], [address] [place], [place] [address]    Report Status 03/08/2020 FINAL  Final  CULTURE, BLOOD (ROUTINE X 2) w  Reflex to ID Panel     Status: Abnormal   Collection Time: 03/11/20  2:03 AM   Specimen: BLOOD  Result Value Ref Range Status   Specimen Description   Final    BLOOD BLOOD RIGHT FOREARM Performed at [facility], [address] [place], [place] [address]    Special Requests   Final    BOTTLES DRAWN AEROBIC AND ANAEROBIC Blood Culture adequate volume Performed at [facility], [address] [place], [place] [address]    Culture  Setup Time   Final    ANAEROBIC BOTTLE ONLY GRAM POSITIVE COCCI Organism ID to follow CRITICAL RESULT CALLED TO, READ BACK BY AND VERIFIED WITH: [place] @1727  03/11/20 MJU Performed at [facility], [address] [place], [place] [address]    Culture (A)  Final    STAPHYLOCOCCUS SPECIES (COAGULASE NEGATIVE) THE SIGNIFICANCE OF ISOLATING THIS ORGANISM FROM A SINGLE SET OF BLOOD CULTURES WHEN MULTIPLE SETS ARE DRAWN IS UNCERTAIN. PLEASE NOTIFY THE MICROBIOLOGY DEPARTMENT WITHIN ONE WEEK IF SPECIATION AND SENSITIVITIES ARE REQUIRED. Performed at [facility], [place] [address] [place], [place] [address]    Report Status 03/13/2020 FINAL  Final  Blood Culture ID Panel (Reflexed)     Status: Abnormal   Collection Time: 03/11/20  2:03 AM  Result Value Ref Range Status   Enterococcus species NOT DETECTED NOT DETECTED Final   Listeria monocytogenes NOT DETECTED NOT DETECTED Final   Staphylococcus species DETECTED (A) NOT DETECTED Final    Comment: Methicillin (oxacillin) susceptible coagulase negative staphylococcus. Possible blood culture contaminant (unless isolated from more than one blood culture draw or clinical case suggests pathogenicity). No antibiotic treatment is indicated for blood  culture contaminants. CRITICAL RESULT CALLED TO, READ BACK BY AND VERIFIED WITH: [place] @1727  03/11/20 MJU    Staphylococcus aureus (BCID) NOT DETECTED NOT DETECTED Final   Methicillin resistance NOT DETECTED NOT DETECTED  Final   Streptococcus species NOT DETECTED NOT DETECTED Final   Streptococcus agalactiae NOT DETECTED NOT DETECTED Final   Streptococcus pneumoniae NOT DETECTED NOT DETECTED Final   Streptococcus pyogenes NOT DETECTED NOT DETECTED Final   Acinetobacter baumannii NOT DETECTED NOT DETECTED Final   Enterobacteriaceae species NOT DETECTED NOT DETECTED Final   Enterobacter cloacae complex NOT DETECTED NOT DETECTED Final   Escherichia coli NOT DETECTED NOT DETECTED Final   Klebsiella oxytoca NOT DETECTED NOT DETECTED Final   Klebsiella pneumoniae NOT DETECTED NOT DETECTED Final   Proteus species NOT DETECTED NOT DETECTED Final   Serratia marcescens NOT DETECTED NOT DETECTED Final   Haemophilus influenzae NOT DETECTED NOT DETECTED Final   Neisseria meningitidis NOT DETECTED NOT DETECTED Final   Pseudomonas aeruginosa NOT DETECTED NOT DETECTED Final   Candida albicans NOT DETECTED NOT DETECTED Final   Candida glabrata NOT DETECTED NOT DETECTED Final   Candida krusei NOT DETECTED NOT DETECTED Final   Candida parapsilosis NOT DETECTED NOT DETECTED Final   Candida tropicalis NOT DETECTED NOT DETECTED Final    Comment: Performed at [facility], [address] [place], [place] [address]  CULTURE, BLOOD (ROUTINE X 2) w Reflex to ID Panel     Status: None (Preliminary result)   Collection Time: 03/11/20  3:47 AM   Specimen: BLOOD  Result Value Ref Range Status   Specimen Description BLOOD BLOOD RIGHT HAND  Final   Special Requests   Final    BOTTLES DRAWN AEROBIC AND ANAEROBIC Blood Culture results may not be optimal due to an inadequate volume of blood received in culture bottles   Culture   Final    NO GROWTH 3 DAYS Performed at [facility], [address], [place], [place] [address]    Report Status PENDING  Incomplete  CULTURE, BLOOD (ROUTINE X 2) w Reflex to ID Panel     Status: None (Preliminary result)   Collection Time: 03/13/20  9:40 AM   Specimen: BLOOD  Result  Value Ref Range Status   Specimen Description BLOOD BLOOD LEFT ARM  Final   Special Requests   Final    BOTTLES DRAWN AEROBIC AND ANAEROBIC Blood Culture adequate volume   Culture   Final    NO GROWTH < 24 HOURS Performed at [facility], [address] [place], [place] [address]    Report Status PENDING  Incomplete  CULTURE, BLOOD (ROUTINE X 2) w Reflex to ID Panel     Status: None (Preliminary result)   Collection Time: 03/13/20  9:43 AM   Specimen: BLOOD  Result Value Ref Range Status   Specimen Description BLOOD BLOOD RIGHT ARM  Final   Special Requests   Final    BOTTLES DRAWN AEROBIC AND ANAEROBIC Blood Culture adequate volume   Culture   Final    NO GROWTH < 24 HOURS Performed at [facility], [address], [place], [place] [address]    Report Status PENDING  Incomplete    Radiology Reports DG Chest 1 View  Result Date: 03/11/2020 CLINICAL DATA:  Sepsis EXAM: CHEST  1 VIEW COMPARISON:  This 03/02/2020 FINDINGS: The heart size and mediastinal contours are within normal limits. Both lungs are clear. Dextroscoliosis with incompletely visualized spinal rods. Gas distended colon is visible in the upper abdomen. IMPRESSION: No active cardiopulmonary disease. Electronically Signed   By: Ulyses Jarred M.D.   On: 03/11/2020 01:30   DG Chest 1 View  Result Date: 03/02/2020 CLINICAL DATA:  Cough, vomiting EXAM: CHEST  1 VIEW COMPARISON:  02/13/2020, 02/25/2020 FINDINGS: Single frontal view of the chest demonstrates stable spinal fusion rods. Continued right convex scoliosis. Cardiac silhouette is unchanged. No airspace disease, effusion, or pneumothorax. Chronic degenerative changes right shoulder. No acute fractures. IMPRESSION: 1. No acute intrathoracic process. Electronically Signed   By: Randa Ngo M.D.   On: 03/02/2020 17:25   DG Abdomen 1 View  Result Date: 03/06/2020 CLINICAL DATA:  Nausea and vomiting EXAM: ABDOMEN - 1 VIEW COMPARISON:  02/13/2020  FINDINGS: Postsurgical changes are noted in the thoracolumbar spine with stable scoliosis concave to the right at the thoracolumbar junction. Scattered large and small bowel gas is noted. No obstructive changes are seen. No free air is noted. No abnormal mass or abnormal calcifications are seen. IMPRESSION: No acute abnormality noted. Electronically Signed   By: Inez Catalina M.D.   On: 03/06/2020 19:20   CT ABDOMEN PELVIS W CONTRAST  Result Date: 03/02/2020 CLINICAL DATA:  Nausea and vomiting EXAM: CT ABDOMEN AND PELVIS WITH CONTRAST TECHNIQUE: Multidetector CT imaging of the abdomen and pelvis was performed using the standard protocol following bolus administration of intravenous  contrast. CONTRAST:  47mL OMNIPAQUE IOHEXOL 300 MG/ML  SOLN COMPARISON:  CT 02/25/2020, 08/03/2014 FINDINGS: Lower chest: Lung bases demonstrate atelectasis at the left base. No consolidation or pleural effusion. Moderate hiatal hernia Hepatobiliary: Central hepatic cysts. No calcified gallstone or biliary dilatation. Pancreas: Unremarkable. No pancreatic ductal dilatation or surrounding inflammatory changes. Spleen: Normal in size without focal abnormality. Adrenals/Urinary Tract: Adrenal glands are unremarkable. Kidneys are normal, without renal calculi, focal lesion, or hydronephrosis. Bladder is unremarkable. Stomach/Bowel: Stomach is within normal limits. Appendix not clearly identified but no right lower quadrant inflammatory process. No evidence of bowel wall thickening, distention, or inflammatory changes. Vascular/Lymphatic: Nonaneurysmal aorta. No suspicious adenopathy. Reproductive: No adnexal mass. Other: Negative for free air or free fluid. Musculoskeletal: Scoliosis of the spine with thoracolumbar spinal rods. Bilateral acetabular dysplasia. No acute osseous abnormality. IMPRESSION: 1. No CT evidence for acute intra-abdominal or pelvic abnormality. 2. Moderate hiatal hernia. Electronically Signed   By: Donavan Foil M.D.    On: 03/02/2020 17:48   CT ABDOMEN PELVIS W CONTRAST  Result Date: 02/25/2020 CLINICAL DATA:  Nausea and vomiting with acute abdominal pain EXAM: CT ABDOMEN AND PELVIS WITH CONTRAST TECHNIQUE: Multidetector CT imaging of the abdomen and pelvis was performed using the standard protocol following bolus administration of intravenous contrast. CONTRAST:  11mL OMNIPAQUE IOHEXOL 300 MG/ML  SOLN COMPARISON:  08/03/2014 FINDINGS: Lower chest:  Moderate sliding hiatal hernia Hepatobiliary: Small cystic density in the central liver.No evidence of biliary obstruction or stone. Pancreas: Unremarkable. Spleen: Unremarkable. Adrenals/Urinary Tract: Negative adrenals. No hydronephrosis or stone. Unremarkable bladder. Stomach/Bowel: No obstruction. No appendicitis. Moderate desiccated stool, but nonobstructive. Vascular/Lymphatic: No acute vascular abnormality. No mass or adenopathy. Reproductive:No pathologic findings. Other: No ascites or pneumoperitoneum. Musculoskeletal: No acute abnormalities. Scoliosis and hip dysplasia. Marked muscular atrophy. No acute osseous finding. IMPRESSION: 1. No acute finding. Negative for bowel obstruction or visible inflammation. 2. Hiatal hernia. Electronically Signed   By: Monte Fantasia M.D.   On: 02/25/2020 04:14   DG Chest Port 1 View  Result Date: 03/13/2020 CLINICAL DATA:  Cough and fevers EXAM: PORTABLE CHEST 1 VIEW COMPARISON:  03/11/2020 FINDINGS: Cardiac shadow is enlarged but stable. The overall inspiratory effort has improved. No focal confluent infiltrate is seen. Scoliosis with prior fixation rods is noted. IMPRESSION: Overall improved aeration.  No focal infiltrate is noted. Electronically Signed   By: Inez Catalina M.D.   On: 03/13/2020 08:31   DG Abd Portable 1V  Result Date: 03/12/2020 CLINICAL DATA:  Tachycardia, nausea. EXAM: PORTABLE ABDOMEN - 1 VIEW COMPARISON:  Plain film of the abdomen dated 03/06/2020. FINDINGS: Overall bowel gas pattern is nonobstructive. No  evidence of soft tissue mass or abnormal fluid collection. No evidence of free intraperitoneal air. No evidence of renal or ureteral calculi. Fixation hardware seen traversing the scoliotic thoracolumbar spine, stable. IMPRESSION: No acute findings. Nonobstructive bowel gas pattern. Electronically Signed   By: Franki Cabot M.D.   On: 03/12/2020 08:43   US Abdomen Limited RUQ  Result Date: 03/06/2020 CLINICAL DATA:  Vomiting for 1 month EXAM: ULTRASOUND ABDOMEN LIMITED RIGHT UPPER QUADRANT COMPARISON:  03/02/2020 FINDINGS: Gallbladder: No gallstones or wall thickening visualized. No sonographic Murphy sign noted by sonographer. Common bile duct: Diameter: 2 mm (assessment is however markedly limited by patient body habitus. Area felt to represent the common bile duct is measured and is anterior to portal venous structures. These areas are not well evaluated given limitations of the current exam. Liver: Small cysts in the liver are similar  to recent CT evaluation. The liver is incompletely assessed due to overlying: Along the RIGHT hepatic lobe, bowel gas in the midline also limiting assessment of midline structures. Portal vein is patent on color Doppler imaging with normal direction of blood flow towards the liver. Again assessment of the porta hepatis is limited by body habitus. Other: No ascites. IMPRESSION: 1. Limited evaluation due to body habitus and overlying bowel gas as described. 2. Within the limitations of the current study no ultrasound evidence of acute cholecystitis. Electronically Signed   By: Zetta Bills M.D.   On: 03/06/2020 17:49     Time Spent in minutes  30     Desiree Hane M.D on 03/14/2020 at 10:15 PM  To page go to [email or web address] - password [facility]

## 2020-03-15 ENCOUNTER — Inpatient Hospital Stay: Payer: Medicare Other

## 2020-03-15 ENCOUNTER — Encounter: Payer: Self-pay | Admitting: Internal Medicine

## 2020-03-15 ENCOUNTER — Inpatient Hospital Stay: Payer: Self-pay

## 2020-03-15 LAB — CBC WITH DIFFERENTIAL/PLATELET
Abs Immature Granulocytes: 0.03 10*3/uL (ref 0.00–0.07)
Abs Immature Granulocytes: 0.06 10*3/uL (ref 0.00–0.07)
Basophils Absolute: 0 10*3/uL (ref 0.0–0.1)
Basophils Absolute: 0 10*3/uL (ref 0.0–0.1)
Basophils Relative: 0 %
Basophils Relative: 0 %
Eosinophils Absolute: 0.2 10*3/uL (ref 0.0–0.5)
Eosinophils Absolute: 0.1 10*3/uL (ref 0.0–0.5)
Eosinophils Relative: 4 %
Eosinophils Relative: 1 %
HCT: 31.5 % — ABNORMAL LOW (ref 36.0–46.0)
HCT: 34.2 % — ABNORMAL LOW (ref 36.0–46.0)
Hemoglobin: 9.8 g/dL — ABNORMAL LOW (ref 12.0–15.0)
Hemoglobin: 10.3 g/dL — ABNORMAL LOW (ref 12.0–15.0)
Immature Granulocytes: 1 %
Immature Granulocytes: 1 %
Lymphocytes Relative: 41 %
Lymphocytes Relative: 16 %
Lymphs Abs: 2.1 10*3/uL (ref 0.7–4.0)
Lymphs Abs: 1.3 10*3/uL (ref 0.7–4.0)
MCH: 29.8 pg (ref 26.0–34.0)
MCH: 29.3 pg (ref 26.0–34.0)
MCHC: 31.1 g/dL (ref 30.0–36.0)
MCHC: 30.1 g/dL (ref 30.0–36.0)
MCV: 95.7 fL (ref 80.0–100.0)
MCV: 97.2 fL (ref 80.0–100.0)
Monocytes Absolute: 0.4 10*3/uL (ref 0.1–1.0)
Monocytes Absolute: 0.4 10*3/uL (ref 0.1–1.0)
Monocytes Relative: 9 %
Monocytes Relative: 5 %
Neutro Abs: 2.3 10*3/uL (ref 1.7–7.7)
Neutro Abs: 6.4 10*3/uL (ref 1.7–7.7)
Neutrophils Relative %: 45 %
Neutrophils Relative %: 77 %
Platelets: 223 10*3/uL (ref 150–400)
Platelets: 274 10*3/uL (ref 150–400)
RBC: 3.29 MIL/uL — ABNORMAL LOW (ref 3.87–5.11)
RBC: 3.52 MIL/uL — ABNORMAL LOW (ref 3.87–5.11)
RDW: 16.8 % — ABNORMAL HIGH (ref 11.5–15.5)
RDW: 17.1 % — ABNORMAL HIGH (ref 11.5–15.5)
Smear Review: NORMAL
Smear Review: NORMAL
WBC: 5 10*3/uL (ref 4.0–10.5)
WBC: 8.1 10*3/uL (ref 4.0–10.5)
nRBC: 0 % (ref 0.0–0.2)
nRBC: 0 % (ref 0.0–0.2)

## 2020-03-15 LAB — BASIC METABOLIC PANEL
Anion gap: 10 (ref 5–15)
BUN: 9 mg/dL (ref 6–20)
CO2: 25 mmol/L (ref 22–32)
Calcium: 8.9 mg/dL (ref 8.9–10.3)
Chloride: 105 mmol/L (ref 98–111)
Creatinine, Ser: 0.64 mg/dL (ref 0.44–1.00)
GFR calc Af Amer: >60 mL/min (ref >60)
GFR calc non Af Amer: >60 mL/min (ref >60)
Glucose, Bld: 147 mg/dL — ABNORMAL HIGH (ref 70–99)
Potassium: 3.9 mmol/L (ref 3.5–5.1)
Sodium: 140 mmol/L (ref 135–145)

## 2020-03-15 LAB — MAGNESIUM
Magnesium: 1.7 mg/dL (ref 1.7–2.4)
Magnesium: 1.8 mg/dL (ref 1.7–2.4)

## 2020-03-15 LAB — PROCALCITONIN: Procalcitonin: <0.1 ng/mL

## 2020-03-15 LAB — COMPREHENSIVE METABOLIC PANEL
ALT: 23 U/L (ref 0–44)
AST: 37 U/L (ref 15–41)
Albumin: 2.8 g/dL — ABNORMAL LOW (ref 3.5–5.0)
Alkaline Phosphatase: 60 U/L (ref 38–126)
Anion gap: 7 (ref 5–15)
BUN: 5 mg/dL — ABNORMAL LOW (ref 6–20)
CO2: 26 mmol/L (ref 22–32)
Calcium: 8.5 mg/dL — ABNORMAL LOW (ref 8.9–10.3)
Chloride: 106 mmol/L (ref 98–111)
Creatinine, Ser: 0.41 mg/dL — ABNORMAL LOW (ref 0.44–1.00)
GFR calc Af Amer: >60 mL/min (ref >60)
GFR calc non Af Amer: >60 mL/min (ref >60)
Glucose, Bld: 117 mg/dL — ABNORMAL HIGH (ref 70–99)
Potassium: 3.4 mmol/L — ABNORMAL LOW (ref 3.5–5.1)
Sodium: 139 mmol/L (ref 135–145)
Total Bilirubin: 0.5 mg/dL (ref 0.3–1.2)
Total Protein: 5.7 g/dL — ABNORMAL LOW (ref 6.5–8.1)

## 2020-03-15 LAB — LACTIC ACID, PLASMA
Lactic Acid, Venous: 1.3 mmol/L (ref 0.5–1.9)
Lactic Acid, Venous: 3 mmol/L (ref 0.5–1.9)

## 2020-03-15 LAB — PREALBUMIN: Prealbumin: 17.4 mg/dL — ABNORMAL LOW (ref 18–38)

## 2020-03-15 LAB — GLUCOSE, CAPILLARY: Glucose-Capillary: 140 mg/dL — ABNORMAL HIGH (ref 70–99)

## 2020-03-15 LAB — LIPASE, BLOOD: Lipase: 19 U/L (ref 11–51)

## 2020-03-15 LAB — PHOSPHORUS: Phosphorus: 2.2 mg/dL — ABNORMAL LOW (ref 2.5–4.6)

## 2020-03-15 LAB — FIBRIN DERIVATIVES D-DIMER (ARMC ONLY): Fibrin derivatives D-dimer (ARMC): 333.67 ng/mL (FEU) (ref 0.00–499.00)

## 2020-03-15 MED ORDER — POTASSIUM PHOSPHATES 15 MMOLE/5ML IV SOLN
20.0000 mmol | Freq: Once | INTRAVENOUS | Status: AC
  Administered 2020-03-15: 20 mmol via INTRAVENOUS
  Filled 2020-03-15: qty 6.67

## 2020-03-15 MED ORDER — BISACODYL 10 MG RE SUPP
10.0000 mg | Freq: Two times a day (BID) | RECTAL | Status: DC
  Administered 2020-03-15 – 2020-03-21 (×9): 10 mg via RECTAL
  Filled 2020-03-15 (×10): qty 1

## 2020-03-15 MED ORDER — OLANZAPINE 5 MG PO TBDP
5.0000 mg | ORAL_TABLET | Freq: Every day | ORAL | Status: DC
  Administered 2020-03-15: 5 mg via ORAL
  Filled 2020-03-15 (×2): qty 1

## 2020-03-15 MED ORDER — IOHEXOL 300 MG/ML  SOLN
75.0000 mL | Freq: Once | INTRAMUSCULAR | Status: AC | PRN
  Administered 2020-03-15: 75 mL via INTRAVENOUS

## 2020-03-15 MED ORDER — OXYCODONE HCL 5 MG PO TABS
5.0000 mg | ORAL_TABLET | ORAL | Status: DC | PRN
  Filled 2020-03-15: qty 1

## 2020-03-15 MED ORDER — NEPRO/CARBSTEADY PO LIQD
237.0000 mL | Freq: Three times a day (TID) | ORAL | Status: DC
  Administered 2020-03-15 – 2020-03-19 (×4): 237 mL via ORAL

## 2020-03-15 MED ORDER — DEXTROSE IN LACTATED RINGERS 5 % IV SOLN: INTRAVENOUS | Status: DC

## 2020-03-15 MED ORDER — HALOPERIDOL LACTATE 5 MG/ML IJ SOLN
2.0000 mg | Freq: Four times a day (QID) | INTRAMUSCULAR | Status: DC | PRN
  Administered 2020-03-15 – 2020-03-18 (×4): 2 mg via INTRAVENOUS
  Filled 2020-03-15 (×4): qty 1
  Filled 2020-03-15: qty 0.4

## 2020-03-15 NOTE — Evaluation (Signed)
Clinical/Bedside Swallow Evaluation Patient Details  Name: Loretta Savage MRN: [id number] Date of Birth: 07/24/1966  Today's Date: 03/15/2020 Time: SLP Start Time (ACUTE ONLY): 0830 SLP Stop Time (ACUTE ONLY): 0930 SLP Time Calculation (min) (ACUTE ONLY): 60 min  Past Medical History:  Past Medical History:  Diagnosis Date  . Anxiety   . Cerebral palsy ([place])   . Chronic kidney disease    stone  . Colon polyp   . GERD (gastroesophageal reflux disease)   . Mentally challenged   . Sleep apnea   . Urinary incontinence    Past Surgical History:  Past Surgical History:  Procedure Laterality Date  . ABDOMINAL HYSTERECTOMY  2006  . BACK SURGERY  03/1991  . ESOPHAGOGASTRODUODENOSCOPY N/A 03/08/2020   Procedure: ESOPHAGOGASTRODUODENOSCOPY (EGD);  Surgeon: Lin Landsman, MD;  Location: [facility] ENDOSCOPY;  Service: Gastroenterology;  Laterality: N/A;  . HIP ADDUCTOR TENOTOMY  2004  . REPAIR QUADRICEPS / HAMSTRING MUSCLE  1992  . SIGMOIDOSCOPY     HPI:  Pt is a 54 y.o. Caucasian female with a known history of anxiety, GERD, Hiatal Hernia, MR(per Mother) and cerebral palsy, who presented to the emergency room with acute onset of intractable nausea and vomiting which has been intermittent over the last few weeks since 4/18.  The patient was scheduled to have an EGD by Dr. Alice Reichert on Wednesday.  Her mother and caregiver believes that she may have had mild abdominal discomfort.  She has been coughing from 02/04/2020 when she was diagnosed with COVID-19.  She did not have any dyspnea or wheezing.  She did not have any bowel movement since Wednesday night and has been taking laxatives.  No chest pain or palpitations.  All history was obtained from the patient's mother as the patient was nonverbal close to her baseline.  Per GI and EGD, pt has "LA Grade D reflux esophagitis with bleeding; White nummular lesions in esophageal mucosa; medium size Hiatal Hernia".  Per Mother's report, pt often has episodes  when "she doesn't want to eat at home" prior to this. She has had inconsistent oral intake since EGD despite a regular diet, GI treating. She receives Morphine for pain per NSG.    Assessment / Plan / Recommendation Clinical Impression  Pt appears at risk for oropharyngeal phase dysphagia (aspiration and choking) d/t pt's reduced Cognitive status Baseline and general awareness of tasks of oral intake. She requires Mod-Max verbal and tactile cues; full feeding support. W/ pt's declined Cognition and reduced mental awareness, pt's safety w/ oral intake reduces somewhat. She also has Acute GI/Esophageal discomfort currently per EGD (see report). GI is following and treating the Esophagitis. This may be impacting pt's desire and awareness for oral intake. She is receiving Pain medication which often makes her drowsy; again, reducing safety w/ oral intake. She has not been taking much po in the past ~3 days per NSG/Mother's report. When pt was positioning upright and given Max verbal/tactile cues, she did attend briefly to TSPs of puree and Nectar consistency liquids and ACCEPTED them orally when placed at lips. No overt coughing or decline in respiratory status was noted during the few TSP trials. However, when pt was not cooperative, she adamanetly turned head away and would not open her mouth to TSPs/Cup placed at lips which resulted in spillage. No further trials were attempted when it was noted that pt did not want the po's. Oral phase appeared adequate for management and clearing of these consistencies given; no gross unilateral weakness noted  orally. Unable to make full assessments d/t pt's declined Cognitive status and follow through.  Recommend a Dysphagia diet level 1 (PUREE) w/ NECTAR liquids to reduce risk for aspiration d/t oropharyngeal dysphagia. (Mother had noted coughing w/ Ensure last 2 days). Recommend aspiration precautions; Pills Crushed in Puree for safer swallowing. Full feeding assistance;  reduce distractions at meals. Recommend f/u w/ Dietician and discussion re: [place] w/ Mother, MD, team. As pt's Esophageal issues heal, she may be more accepting of po's in order to meet nutrition/hydration needs.  SLP Visit Diagnosis: Dysphagia, oropharyngeal phase (R13.12)(baseline Cognitive decline)    Aspiration Risk  Moderate aspiration risk;Risk for inadequate nutrition/hydration    Diet Recommendation  Dysphagia level 1 (PUREE) w/ NECTAR liquids; aspiration precautions; full feeding assistance at meals; GERD/Reflux precautions  Medication Administration: Crushed with puree(as able)    Other  Recommendations Recommended Consults: Consider GI evaluation(following; Dietician f/u for consult) Oral Care Recommendations: Oral care BID;Oral care before and after PO;Staff/trained caregiver to provide oral care Other Recommendations: Order thickener from pharmacy;Prohibited food (jello, ice cream, thin soups);Remove water pitcher;Have oral suction available   Follow up Recommendations (TBD)      Frequency and Duration min 2x/week  1 week       Prognosis Prognosis for Safe Diet Advancement: Guarded Barriers to Reach Goals: Cognitive deficits;Time post onset;Severity of deficits;Behavior      Swallow Study   General Date of Onset: 03/06/20 HPI: Pt is a 54 y.o. Caucasian female with a known history of anxiety, GERD, Hiatal Hernia, MR(per Mother) and cerebral palsy, who presented to the emergency room with acute onset of intractable nausea and vomiting which has been intermittent over the last few weeks since 4/18.  The patient was scheduled to have an EGD by Dr. Alice Reichert on Wednesday.  Her mother and caregiver believes that she may have had mild abdominal discomfort.  She has been coughing from 02/04/2020 when she was diagnosed with COVID-19.  She did not have any dyspnea or wheezing.  She did not have any bowel movement since Wednesday night and has been taking laxatives.  No chest pain or  palpitations.  All history was obtained from the patient's mother as the patient was nonverbal close to her baseline.  Per GI and EGD, pt has "LA Grade D reflux esophagitis with bleeding; White nummular lesions in esophageal mucosa; medium size Hiatal Hernia".  Per Mother's report, pt often has episodes when "she doesn't want to eat at home" prior to this. She has had inconsistent oral intake since EGD despite a regular diet, GI treating. She receives Morphine for pain per NSG.  Type of Study: Bedside Swallow Evaluation Previous Swallow Assessment: none Diet Prior to this Study: Regular;Thin liquids Temperature Spikes Noted: No(wbc 6.6) Respiratory Status: Room air History of Recent Intubation: No Behavior/Cognition: Confused;Agitated;Distractible;Requires cueing;Doesn't follow directions;Cooperative;Uncooperative Oral Cavity Assessment: (unable to assess d/t Cognition) Oral Care Completed by SLP: No(unable to d/t Cognition) Oral Cavity - Dentition: Adequate natural dentition Vision: (unknown) Self-Feeding Abilities: Total assist Patient Positioning: Upright in bed(needed full positioning) Baseline Vocal Quality: (moaning noises) Volitional Cough: Cognitively unable to elicit Volitional Swallow: Unable to elicit    Oral/Motor/Sensory Function Overall Oral Motor/Sensory Function: (unable to assess d/t Cognition; no unilateral weakness)   Ice Chips Ice chips: Not tested   Thin Liquid Thin Liquid: Not tested    Nectar Thick Nectar Thick Liquid: Impaired Presentation: Spoon;Cup(fed; 4 trials via spoon; 2-3 attempts by cup) Oral Phase Impairments: Poor awareness of bolus;Reduced labial seal(uncooperative; spillage) Oral  phase functional implications: (disorganized) Pharyngeal Phase Impairments: (none)   Honey Thick Honey Thick Liquid: Not tested   Puree Puree: Impaired Presentation: Spoon(fed; 3 trials) Oral Phase Impairments: Reduced labial seal;Poor awareness of bolus(uncooperative;  spillage) Pharyngeal Phase Impairments: (none)   Solid     Solid: Not tested      Orinda Kenner, MS, CCC-SLP Loretta Savage 03/15/2020,11:53 AM

## 2020-03-15 NOTE — Progress Notes (Signed)
[facility]  PROGRESS NOTE  Loretta Savage [id number] DOB: 10-Sep-1966 DOA: 03/06/2020 PCP: Olin Hauser, DO Admit date - 03/06/2020   Admitting Physician Sidney Ace, MD  Outpatient Primary MD for the patient is Olin Hauser, DO  LOS - 8 Brief Narrative   Loretta Savage is a 54 y.o. year old female with medical history significant for cerebral palsy, GERD, anxiety who presented on 03/06/2020 with several weeks of intractable nausea and vomiting and was found to have esophagitis with bleeding complicated by esophageal candidiasis confirmed on EGD with biopsy. Hospital course complicated by diminished oral intake with resultant hypophosphatemia, hypoglycemia, sinus tachycardia, hypotension.    Subjective  Patient visually more agitated.  Tomorrow morning.  Appears in moderate distress to mother as well.  Minimal oral intake.  Responded well to Haldol per mother  A & P   Persistent sinus tachycardia, suspect related to dehydration given diminished oral intake.  Heart rate stable in the 110s to 120s.  Other work-up unremarkable (within normal limits TSH, hemoglobin stable, blood pressure responsive to IV fluids) -Continue IV fluids increase rate -Continue to monitor heart rate -Encourage oral hydration (soft diet) safely with assistance, aspiration precautions -Continue goals of care discussion, appreciate palliative care  Hypotension, improving.  Blood pressure  much improved from being in the low 80s a few days prior responding to maintenance fluids.  Suspect related to dehydration given stable hemoglobin, slight lactic acidosis of 2.2.  Procalcitonin is 1.26 so infection is a possibility but could also be elevated in the setting of inflammation related to her esophagitis/esophageal candidiasis, she did have a slight fever 100.5 however UA, chest x-ray, blood cultures remain unremarkable without leukocytosis we will continue to hold off on antibiotics  and closely monitor  -Continue IV fluids -Monitor blood cultures Discontinue Cardura  1 of 4 blood cultures growing GPC (methicillin sensitive).  Suspect contamination given patient has more likely explanation for hypotension/tachycardia. -Hold off any antibiotics currently suspect skin contamination -follow repeat blood cultures  Esophageal candidiasis.  Concerning findings on EGD.  Biopsy showed budding yeast -Continue IV Diflucan until able to tolerate p.o.   Esophagitis with bleeding found on EGD.  Hemoglobin remained stable, not having any recurrent nausea or vomiting. Intractable nausea and vomiting, resolved -Continue IV PPI until able to tolerate p.o. -On discharge need to continue Prilosec 40 mg p.o. twice daily for 2 months with repeat EGD in 2 months to check healing. -Return to GI office in 1 month with Dr. Alice Reichert  Normocytic anemia, assist with iron deficiency hemoglobin stable at 9-10.  No signs or symptoms of bleeding.  14 on admission, suspect hemoconcentration, prior hemoglobins closer to 9-11 -Monitor CBC -Completed course of Injectafer for 5 days -Start oral iron supplementation on discharge  Hypoglycemia in setting of diminished oral intake related to esophagitis, no recurrent hypoglycemic episodes, resolved -Discontinue D5 1/2normal saline IV fluids -Monitor on soft diet  Hypophosphatemia likely also with a to diminished oral intake -Replete phosphorus as necessary -Monitor phosphorus level daily  Hypomagnesemia.  Not currently have any diarrhea potassium within normal limits. -Replete IV, repeat mag in a.m.  Chronic constipation.  Normal abdominal exam.  Abdominal x-ray nonobstructive pattern. -optimize bowel regimen, closely monitor  Goals of care. Palliative care consulted. Patient prognosis is relatively guarded given her poor p.o. intake and esophagitis as well as generalized deconditioning. She also is at risk for aspiration and currently on  dysphagia 1 diet. Given this presentation I do feel that  the patient will benefit from more conservative approach. Currently no organic etiology undefined for patient's increased agitation based on the CT scan. We will continue to monitor.    Family Communication  : Mother updated at bedside  Code Status : Full  Disposition Plan  :  Patient is from home. Anticipated d/c date: 2 to 3 days. Barriers to d/c or necessity for inpatient status:  Currently requiring IV supplementation with Diflucan for esophageal candidiasis, IV Protonix for esophagitis, aggressive repletion of magnesium/phosphorus/potassium in setting of diminished oral intake related to esophagitis.  High concern for aspiration, continue aspiration precautions, trial oral diet Consults  : Gastroenterology, palliative  Procedures  :    EGD, 03/10/2020:  DVT Prophylaxis  :  Lovenox   Lab Results  Component Value Date   PLT 223 03/15/2020    Diet :  Diet Order            DIET - DYS 1 Room service appropriate? Yes with Assist; Fluid consistency: Nectar Thick  Diet effective now               Inpatient Medications Scheduled Meds:  bisacodyl  10 mg Rectal BID   enoxaparin (LOVENOX) injection  40 mg Subcutaneous Q24H   feeding supplement (NEPRO CARB STEADY)  237 mL Oral TID WC   fluticasone  2 spray Each Nare Daily   OLANZapine zydis  5 mg Oral QHS   pantoprazole (PROTONIX) IV  40 mg Intravenous Q12H   polyethylene glycol  17 g Oral BID   senna-docusate  2 tablet Oral BID   sucralfate  1 g Oral TID WC & HS   Continuous Infusions:  dextrose 5% lactated ringers Stopped (03/15/20 1556)   fluconazole (DIFLUCAN) IV Stopped (03/15/20 1130)   potassium PHOSPHATE IVPB (in mmol) 20 mmol (03/15/20 1559)   PRN Meds:.acetaminophen **OR** acetaminophen, haloperidol lactate, morphine injection, ondansetron **OR** ondansetron (ZOFRAN) IV, oxyCODONE, phenol  Antibiotics  :   Anti-infectives (From admission,  onward)   Start     Dose/Rate Route Frequency Ordered Stop   03/09/20 1400  fluconazole (DIFLUCAN) IVPB 200 mg    Note to Pharmacy: We will switch to oral once patient will start taking oral medications   200 mg 100 mL/hr over 60 Minutes Intravenous Every 24 hours 03/09/20 1144 03/23/20 0959   03/07/20 0800  methenamine (MANDELAMINE) tablet 1 g  Status:  Discontinued     1 g Oral 2 times daily with meals 03/06/20 2207 03/15/20 1148       Objective   Vitals:   03/15/20 0800 03/15/20 0936 03/15/20 1205 03/15/20 1614  BP: (!) 96/47 (!) 98/59 115/76 115/82  Pulse: (!) 104 100 (!) 120 (!) 123  Resp: 14 20 15 16   Temp: 98.3 F (36.8 C) 98.7 F (37.1 C) 98.6 F (37 C) 98.7 F (37.1 C)  TempSrc:  Oral  Oral  SpO2: (!) 84% 99% 96% 98%  Weight:      Height:        SpO2: 98 % O2 Flow Rate (L/min): 0 L/min  Wt Readings from Last 3 Encounters:  03/06/20 47.6 kg  03/02/20 52.2 kg  02/24/20 52.2 kg     Intake/Output Summary (Last 24 hours) at 03/15/2020 1922 Last data filed at 03/15/2020 1847 Gross per 24 hour  Intake 1017.55 ml  Output 900 ml  Net 117.55 ml    Physical Exam:    General: Appear in severe distress, no Rash; Oral Mucosa Clear, dry. no Abnormal Neck  Mass Or lumps, Conjunctiva normal  Cardiovascular: S1 and S2 Present, no Murmur, Respiratory: Increased respiratory effort, Bilateral Air entry present and Clear to Auscultation, no Crackles, no wheezes Abdomen: Bowel Sound present, Soft and difficult to assess  tenderness, no hernia Extremities: no Pedal edema, no calf tenderness Neurology: lethargic and not oriented to time, place, and person affect emotionally labile.     I have personally reviewed the following:   Data Reviewed:  CBC Recent Labs  Lab 03/11/20 0347 03/11/20 0347 03/11/20 1233 03/12/20 0747 03/13/20 0334 03/14/20 0846 03/15/20 1208  WBC 6.4   < > 4.5 4.7 5.6 6.6 5.0  HGB 9.8*   < > 9.2* 10.3* 8.8* 9.2* 9.8*  HCT 32.0*   < > 29.6*  32.4* 29.5* 30.0* 31.5*  PLT 260   < > 233 177 223 218 223  MCV 95.8   < > 95.8 92.8 98.0 96.8 95.7  MCH 29.3   < > 29.8 29.5 29.2 29.7 29.8  MCHC 30.6   < > 31.1 31.8 29.8* 30.7 31.1  RDW 14.6   < > 15.2 15.6* 15.6* 15.9* 16.8*  LYMPHSABS 1.9  --   --   --   --   --  2.1  MONOABS 0.4  --   --   --   --   --  0.4  EOSABS 0.2  --   --   --   --   --  0.2  BASOSABS 0.0  --   --   --   --   --  0.0   < > = values in this interval not displayed.    Chemistries  Recent Labs  Lab 03/09/20 0628 03/09/20 0628 03/10/20 0625 03/10/20 1551 03/11/20 0347 03/12/20 0747 03/13/20 0334 03/14/20 0846 03/15/20 1208  NA 142   < > 138   < > 142 143 144 138 139  K 3.1*   < > 3.1*   < > 4.5 4.6 4.1 4.2 3.4*  CL 110   < > 105   < > 111 109 107 104 106  CO2 24   < > 27   < > 26 29 29 29 26   GLUCOSE 96   < > 109*   < > 115* 93 116* 98 117*  BUN <5*   < > <5*   < > <5* <5* 6 <5* 5*  CREATININE 0.30*   < > 0.37*   < > 0.31* <0.30* 0.40* 0.32* 0.41*  CALCIUM 8.3*   < > 8.2*   < > 8.4* 8.1* 9.0 8.7* 8.5*  MG 1.5*  --  1.9  --  1.6* 1.9  --   --  1.7  AST  --   --   --   --  25  --   --   --  37  ALT  --   --   --   --  15  --   --   --  23  ALKPHOS  --   --   --   --  45  --   --   --  60  BILITOT  --   --   --   --  0.4  --   --   --  0.5   < > = values in this interval not displayed.   ------------------------------------------------------------------------------------------------------------------ No results for input(s): CHOL, HDL, LDLCALC, TRIG, CHOLHDL, LDLDIRECT in the last 72 hours.  Lab Results  Component Value Date  HGBA1C 4.9 07/29/2019   ------------------------------------------------------------------------------------------------------------------ No results for input(s): TSH, T4TOTAL, T3FREE, THYROIDAB in the last 72 hours.  Invalid input(s): FREET3 ------------------------------------------------------------------------------------------------------------------ No results for  input(s): VITAMINB12, FOLATE, FERRITIN, TIBC, IRON, RETICCTPCT in the last 72 hours.  Coagulation profile No results for input(s): INR, PROTIME in the last 168 hours.  No results for input(s): DDIMER in the last 72 hours.  Cardiac Enzymes No results for input(s): CKMB, TROPONINI, MYOGLOBIN in the last 168 hours.  Invalid input(s): CK ------------------------------------------------------------------------------------------------------------------ No results found for: BNP  Micro Results Recent Results (from the past 240 hour(s))  KOH prep     Status: None   Collection Time: 03/08/20 11:30 AM   Specimen: Esophagus  Result Value Ref Range Status   Specimen Description ESOPHAGUS  Final   Special Requests NONE  Final   KOH Prep   Final    BUDDING YEAST SEEN Performed at [facility], [address] [place], [place] [address]    Report Status 03/08/2020 FINAL  Final  CULTURE, BLOOD (ROUTINE X 2) w Reflex to ID Panel     Status: Abnormal   Collection Time: 03/11/20  2:03 AM   Specimen: BLOOD  Result Value Ref Range Status   Specimen Description   Final    BLOOD BLOOD RIGHT FOREARM Performed at [facility], [address] [place], [place] [address]    Special Requests   Final    BOTTLES DRAWN AEROBIC AND ANAEROBIC Blood Culture adequate volume Performed at [facility], [address] [place], [place] [address]    Culture  Setup Time   Final    ANAEROBIC BOTTLE ONLY GRAM POSITIVE COCCI Organism ID to follow CRITICAL RESULT CALLED TO, READ BACK BY AND VERIFIED WITH: [place] @1727  03/11/20 MJU Performed at [facility], [address] [place], [place] [address]    Culture (A)  Final    STAPHYLOCOCCUS SPECIES (COAGULASE NEGATIVE) THE SIGNIFICANCE OF ISOLATING THIS ORGANISM FROM A SINGLE SET OF BLOOD CULTURES WHEN MULTIPLE SETS ARE DRAWN IS UNCERTAIN. PLEASE NOTIFY THE MICROBIOLOGY DEPARTMENT WITHIN ONE WEEK IF SPECIATION AND  SENSITIVITIES ARE REQUIRED. Performed at [facility], [place] [address], [place], [place] [address]    Report Status 03/13/2020 FINAL  Final  Blood Culture ID Panel (Reflexed)     Status: Abnormal   Collection Time: 03/11/20  2:03 AM  Result Value Ref Range Status   Enterococcus species NOT DETECTED NOT DETECTED Final   Listeria monocytogenes NOT DETECTED NOT DETECTED Final   Staphylococcus species DETECTED (A) NOT DETECTED Final    Comment: Methicillin (oxacillin) susceptible coagulase negative staphylococcus. Possible blood culture contaminant (unless isolated from more than one blood culture draw or clinical case suggests pathogenicity). No antibiotic treatment is indicated for blood  culture contaminants. CRITICAL RESULT CALLED TO, READ BACK BY AND VERIFIED WITH: ALEX CHAPPELL @1727  03/11/20 MJU    Staphylococcus aureus (BCID) NOT DETECTED NOT DETECTED Final   Methicillin resistance NOT DETECTED NOT DETECTED Final   Streptococcus species NOT DETECTED NOT DETECTED Final   Streptococcus agalactiae NOT DETECTED NOT DETECTED Final   Streptococcus pneumoniae NOT DETECTED NOT DETECTED Final   Streptococcus pyogenes NOT DETECTED NOT DETECTED Final   Acinetobacter baumannii NOT DETECTED NOT DETECTED Final   Enterobacteriaceae species NOT DETECTED NOT DETECTED Final   Enterobacter cloacae complex NOT DETECTED NOT DETECTED Final   Escherichia coli NOT DETECTED NOT DETECTED Final   Klebsiella oxytoca NOT DETECTED NOT DETECTED Final   Klebsiella pneumoniae  NOT DETECTED NOT DETECTED Final   Proteus species NOT DETECTED NOT DETECTED Final   Serratia marcescens NOT DETECTED NOT DETECTED Final   Haemophilus influenzae NOT DETECTED NOT DETECTED Final   Neisseria meningitidis NOT DETECTED NOT DETECTED Final   Pseudomonas aeruginosa NOT DETECTED NOT DETECTED Final   Candida albicans NOT DETECTED NOT DETECTED Final   Candida glabrata NOT DETECTED NOT DETECTED Final   Candida krusei NOT DETECTED  NOT DETECTED Final   Candida parapsilosis NOT DETECTED NOT DETECTED Final   Candida tropicalis NOT DETECTED NOT DETECTED Final    Comment: Performed at [facility], [address] [place], [place] [address]  CULTURE, BLOOD (ROUTINE X 2) w Reflex to ID Panel     Status: None (Preliminary result)   Collection Time: 03/11/20  3:47 AM   Specimen: BLOOD  Result Value Ref Range Status   Specimen Description BLOOD BLOOD RIGHT HAND  Final   Special Requests   Final    BOTTLES DRAWN AEROBIC AND ANAEROBIC Blood Culture results may not be optimal due to an inadequate volume of blood received in culture bottles   Culture   Final    NO GROWTH 4 DAYS Performed at [facility], [address] [place], [place] [address]    Report Status PENDING  Incomplete  CULTURE, BLOOD (ROUTINE X 2) w Reflex to ID Panel     Status: None (Preliminary result)   Collection Time: 03/13/20  9:40 AM   Specimen: BLOOD  Result Value Ref Range Status   Specimen Description BLOOD BLOOD LEFT ARM  Final   Special Requests   Final    BOTTLES DRAWN AEROBIC AND ANAEROBIC Blood Culture adequate volume   Culture   Final    NO GROWTH 2 DAYS Performed at [facility], [address] [place], [place] [address]    Report Status PENDING  Incomplete  CULTURE, BLOOD (ROUTINE X 2) w Reflex to ID Panel     Status: None (Preliminary result)   Collection Time: 03/13/20  9:43 AM   Specimen: BLOOD  Result Value Ref Range Status   Specimen Description BLOOD BLOOD RIGHT ARM  Final   Special Requests   Final    BOTTLES DRAWN AEROBIC AND ANAEROBIC Blood Culture adequate volume   Culture   Final    NO GROWTH 2 DAYS Performed at [facility], [address], [place], [place] [address]    Report Status PENDING  Incomplete    Radiology Reports DG Chest 1 View  Result Date: 03/11/2020 CLINICAL DATA:  Sepsis EXAM: CHEST  1 VIEW COMPARISON:  This 03/02/2020 FINDINGS: The heart size and mediastinal  contours are within normal limits. Both lungs are clear. Dextroscoliosis with incompletely visualized spinal rods. Gas distended colon is visible in the upper abdomen. IMPRESSION: No active cardiopulmonary disease. Electronically Signed   By: Ulyses Jarred M.D.   On: 03/11/2020 01:30   DG Chest 1 View  Result Date: 03/02/2020 CLINICAL DATA:  Cough, vomiting EXAM: CHEST  1 VIEW COMPARISON:  02/13/2020, 02/25/2020 FINDINGS: Single frontal view of the chest demonstrates stable spinal fusion rods. Continued right convex scoliosis. Cardiac silhouette is unchanged. No airspace disease, effusion, or pneumothorax. Chronic degenerative changes right shoulder. No acute fractures. IMPRESSION: 1. No acute intrathoracic process. Electronically Signed   By: Randa Ngo M.D.   On: 03/02/2020 17:25   DG Abdomen 1 View  Result Date: 03/06/2020 CLINICAL DATA:  Nausea and vomiting EXAM: ABDOMEN - 1 VIEW COMPARISON:  02/13/2020 FINDINGS: Postsurgical changes are noted in the thoracolumbar spine with stable scoliosis concave to the right at the thoracolumbar junction. Scattered large and small bowel gas is noted. No obstructive changes are seen. No free air is noted. No abnormal mass or abnormal calcifications are seen. IMPRESSION: No acute abnormality noted. Electronically Signed   By: Inez Catalina M.D.   On: 03/06/2020 19:20   CT ABDOMEN PELVIS W CONTRAST  Result Date: 03/15/2020 CLINICAL DATA:  Abdominal pain, intractable nausea and vomiting, candidal esophagitis EXAM: CT ABDOMEN AND PELVIS WITH CONTRAST TECHNIQUE: Multidetector CT imaging of the abdomen and pelvis was performed using the standard protocol following bolus administration of intravenous contrast. CONTRAST:  12mL OMNIPAQUE IOHEXOL 300 MG/ML  SOLN COMPARISON:  03/02/2020 FINDINGS: Lower chest: No acute abnormality. Small hiatal hernia, relatively reduced on current examination. Unchanged scarring or atelectasis of the left lung base. Hepatobiliary: No solid  liver abnormality is seen. No gallstones, gallbladder wall thickening, or biliary dilatation. Pancreas: Unremarkable. No pancreatic ductal dilatation or surrounding inflammatory changes. Spleen: Normal in size without significant abnormality. Adrenals/Urinary Tract: Adrenal glands are unremarkable. Kidneys are normal, without renal calculi, solid lesion, or hydronephrosis. Bladder is unremarkable. Stomach/Bowel: Stomach is within normal limits. Appendix appears normal. No evidence of bowel wall thickening, distention, or inflammatory changes. Dense stool balls in the rectum. Vascular/Lymphatic: No significant vascular findings are present. No enlarged abdominal or pelvic lymph nodes. Reproductive: No mass or other significant abnormality. Other: No abdominal wall hernia or abnormality. No abdominopelvic ascites. Musculoskeletal: No acute or significant osseous findings. Extensive posterior thoracolumbar fusion. IMPRESSION: 1. No acute CT findings of the abdomen or pelvis to explain pain or vomiting. 2. Small hiatal hernia, relatively reduced on current examination. Electronically Signed   By: Eddie Candle M.D.   On: 03/15/2020 13:44   CT ABDOMEN PELVIS W CONTRAST  Result Date: 03/02/2020 CLINICAL DATA:  Nausea and vomiting EXAM: CT ABDOMEN AND PELVIS WITH CONTRAST TECHNIQUE: Multidetector CT imaging of the abdomen and pelvis was performed using the standard protocol following bolus administration of intravenous contrast. CONTRAST:  82mL OMNIPAQUE IOHEXOL 300 MG/ML  SOLN COMPARISON:  CT 02/25/2020, 08/03/2014 FINDINGS: Lower chest: Lung bases demonstrate atelectasis at the left base. No consolidation or pleural effusion. Moderate hiatal hernia Hepatobiliary: Central hepatic cysts. No calcified gallstone or biliary dilatation. Pancreas: Unremarkable. No pancreatic ductal dilatation or surrounding inflammatory changes. Spleen: Normal in size without focal abnormality. Adrenals/Urinary Tract: Adrenal glands are  unremarkable. Kidneys are normal, without renal calculi, focal lesion, or hydronephrosis. Bladder is unremarkable. Stomach/Bowel: Stomach is within normal limits. Appendix not clearly identified but no right lower quadrant inflammatory process. No evidence of bowel wall thickening, distention, or inflammatory changes. Vascular/Lymphatic: Nonaneurysmal aorta. No suspicious adenopathy. Reproductive: No adnexal mass. Other: Negative for free air or free fluid. Musculoskeletal: Scoliosis of the spine with thoracolumbar spinal rods. Bilateral acetabular dysplasia. No acute osseous abnormality. IMPRESSION: 1. No CT evidence for acute intra-abdominal or pelvic abnormality. 2. Moderate hiatal hernia. Electronically Signed   By: Donavan Foil M.D.   On: 03/02/2020 17:48   CT ABDOMEN PELVIS W CONTRAST  Result Date: 02/25/2020 CLINICAL DATA:  Nausea and vomiting with acute abdominal pain EXAM: CT ABDOMEN AND PELVIS WITH CONTRAST TECHNIQUE: Multidetector CT imaging of the abdomen and pelvis was performed using the standard protocol following bolus administration of intravenous contrast. CONTRAST:  70mL OMNIPAQUE IOHEXOL 300 MG/ML  SOLN COMPARISON:  08/03/2014 FINDINGS: Lower chest:  Moderate sliding hiatal hernia Hepatobiliary: Small cystic density in the central  liver.No evidence of biliary obstruction or stone. Pancreas: Unremarkable. Spleen: Unremarkable. Adrenals/Urinary Tract: Negative adrenals. No hydronephrosis or stone. Unremarkable bladder. Stomach/Bowel: No obstruction. No appendicitis. Moderate desiccated stool, but nonobstructive. Vascular/Lymphatic: No acute vascular abnormality. No mass or adenopathy. Reproductive:No pathologic findings. Other: No ascites or pneumoperitoneum. Musculoskeletal: No acute abnormalities. Scoliosis and hip dysplasia. Marked muscular atrophy. No acute osseous finding. IMPRESSION: 1. No acute finding. Negative for bowel obstruction or visible inflammation. 2. Hiatal hernia.  Electronically Signed   By: Monte Fantasia M.D.   On: 02/25/2020 04:14   DG Chest Port 1 View  Result Date: 03/13/2020 CLINICAL DATA:  Cough and fevers EXAM: PORTABLE CHEST 1 VIEW COMPARISON:  03/11/2020 FINDINGS: Cardiac shadow is enlarged but stable. The overall inspiratory effort has improved. No focal confluent infiltrate is seen. Scoliosis with prior fixation rods is noted. IMPRESSION: Overall improved aeration.  No focal infiltrate is noted. Electronically Signed   By: Inez Catalina M.D.   On: 03/13/2020 08:31   DG Abd Portable 1V  Result Date: 03/12/2020 CLINICAL DATA:  Tachycardia, nausea. EXAM: PORTABLE ABDOMEN - 1 VIEW COMPARISON:  Plain film of the abdomen dated 03/06/2020. FINDINGS: Overall bowel gas pattern is nonobstructive. No evidence of soft tissue mass or abnormal fluid collection. No evidence of free intraperitoneal air. No evidence of renal or ureteral calculi. Fixation hardware seen traversing the scoliotic thoracolumbar spine, stable. IMPRESSION: No acute findings. Nonobstructive bowel gas pattern. Electronically Signed   By: Franki Cabot M.D.   On: 03/12/2020 08:43   US Abdomen Limited RUQ  Result Date: 03/06/2020 CLINICAL DATA:  Vomiting for 1 month EXAM: ULTRASOUND ABDOMEN LIMITED RIGHT UPPER QUADRANT COMPARISON:  03/02/2020 FINDINGS: Gallbladder: No gallstones or wall thickening visualized. No sonographic Murphy sign noted by sonographer. Common bile duct: Diameter: 2 mm (assessment is however markedly limited by patient body habitus. Area felt to represent the common bile duct is measured and is anterior to portal venous structures. These areas are not well evaluated given limitations of the current exam. Liver: Small cysts in the liver are similar to recent CT evaluation. The liver is incompletely assessed due to overlying: Along the RIGHT hepatic lobe, bowel gas in the midline also limiting assessment of midline structures. Portal vein is patent on color Doppler imaging with  normal direction of blood flow towards the liver. Again assessment of the porta hepatis is limited by body habitus. Other: No ascites. IMPRESSION: 1. Limited evaluation due to body habitus and overlying bowel gas as described. 2. Within the limitations of the current study no ultrasound evidence of acute cholecystitis. Electronically Signed   By: Zetta Bills M.D.   On: 03/06/2020 17:49     Time Spent in minutes  30     Berle Mull M.D on 03/15/2020 at 7:22 PM  To page go to [email or web address] - password [facility]

## 2020-03-15 NOTE — Progress Notes (Signed)
Patient BP has been low overnight. Notified O[place] NP the her BP was low. Patient was moaning, restless Morphine given. Low BP usually occurs after the patient receives Morphine. Will continue to monitor.

## 2020-03-15 NOTE — Progress Notes (Signed)
Daily Progress Note   Patient Name: Loretta Savage       Date: 03/15/2020 DOB: 1966-09-28  Age: 54 y.o. MRN#: [id number] Attending Physician: Lavina Hamman, MD Primary Care Physician: Olin Hauser, DO Admit Date: 03/06/2020  Reason for Consultation/Follow-up: Establishing goals of care  Subjective: Patient moaning/agitated. Mother reports Loretta Savage had a rough night and required prn Morphine. Attending ordered prn haldol. RN at bedside giving prn Haldol.   GOC:  Discussed plan of care with Dr. Posey Pronto. CT abdomen and lab work ordered.   F/u with mother at bedside to discuss diagnoses, interventions, plan of care. Loretta Savage speaks of her concerns that Loretta Savage is heading "backwards" instead of showing improvement despite medical management. She continues to be uncomfortable and requiring prn morphine. She spits out her medications. Oral intake remains poor. Loretta Savage is appreciative of Dr. Posey Pronto ordering more testing.   Answered questions. Therapeutic listening and emotional/spiritual support provided. Reassured Loretta Savage that another palliative provider will follow-up Thursday or Friday for ongoing support. Loretta Savage has PMT contact information.   Length of Stay: 8  Current Medications: Scheduled Meds:  . enoxaparin (LOVENOX) injection  40 mg Subcutaneous Q24H  . feeding supplement (NEPRO CARB STEADY)  237 mL Oral TID WC  . fluticasone  2 spray Each Nare Daily  . pantoprazole (PROTONIX) IV  40 mg Intravenous Q12H  . polyethylene glycol  17 g Oral BID  . senna-docusate  2 tablet Oral BID  . sucralfate  1 g Oral TID WC & HS    Continuous Infusions: . dextrose 5% lactated ringers 125 mL/hr at 03/15/20 0921  . fluconazole (DIFLUCAN) IV 200 mg (03/15/20 1031)  . potassium PHOSPHATE IVPB (in  mmol)      PRN Meds: acetaminophen **OR** acetaminophen, haloperidol lactate, morphine injection, ondansetron **OR** ondansetron (ZOFRAN) IV, oxyCODONE, phenol  Physical Exam Vitals and nursing note reviewed.  Constitutional:      Comments: Awake, moaning, uncomfortable.  Cardiovascular:     Heart sounds: Normal heart sounds.  Pulmonary:     Effort: No tachypnea, accessory muscle usage or respiratory distress.  Skin:    General: Skin is warm and dry.  Neurological:     Comments: Baseline non-verbal with CP/MR. Awake and uncomfortable this morning. Persistent moaning.            Vital  Signs: BP 115/76 (BP Location: Left Arm)   Pulse (!) 120   Temp 98.6 F (37 C)   Resp 15   Ht 4\' 5"  (1.346 m)   Wt 47.6 kg   LMP  (LMP Unknown)   SpO2 96%   BMI 26.28 kg/m  SpO2: SpO2: 96 % O2 Device: O2 Device: Room Air O2 Flow Rate: O2 Flow Rate (L/min): 0 L/min  Intake/output summary:   Intake/Output Summary (Last 24 hours) at 03/15/2020 1404 Last data filed at 03/15/2020 1017 Gross per 24 hour  Intake 1308.02 ml  Output 1200 ml  Net 108.02 ml   LBM: Last BM Date: 03/15/20 Baseline Weight: Weight: 47.6 kg Most recent weight: Weight: 47.6 kg       Palliative Assessment/Data: PPS 30%    Flowsheet Rows     Most Recent Value  Intake Tab  Referral Department  Hospitalist  Unit at Time of Referral  Med/Surg Unit  Date Notified  03/10/20  Palliative Care Type  New Palliative care  Reason for referral  Clarify Goals of Care  Date of Admission  03/06/20  Date first seen by Palliative Care  03/13/20  # of days Palliative referral response time  3 Day(s)  # of days IP prior to Palliative referral  4  Clinical Assessment  Psychosocial & Spiritual Assessment  Palliative Care Outcomes      Patient Active Problem List   Diagnosis Date Noted  . Cerebral palsy ([place])   . Palliative care by specialist   . Goals of care, counseling/discussion   . Iron deficiency anemia due to  chronic blood loss 03/11/2020  . Hypophosphatemia 03/11/2020  . Esophagitis 03/11/2020  . Esophageal candidiasis ([place]) 03/11/2020  . Hypotension 03/11/2020  . Sinus tachycardia 03/11/2020  . Hypomagnesemia 03/11/2020  . Intractable nausea and vomiting 03/06/2020  . Scoliosis 11/23/2019  . Bowel and bladder incontinence 11/23/2019  . 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 Anxiety 02/04/2018  . Insomnia 02/04/2018  . 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 GERD (gastroesophageal reflux disease) 02/04/2018  . 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 Seasonal allergies 02/04/2018  . Chronic interstitial cystitis 02/04/2018    Palliative Care Assessment & Plan   Patient Profile: 54 y.o. female  with past medical history of cerebral palsy, GERD, anxiety admitted on 03/06/2020 with intractable nausea and vomiting. GI following. EGD performed and found to have esophagitis with bleeding complicated by esophageal candidiasis. Medium sized hiatal hernia. On IV PPI and IV Diflucan. Outpatient GI recommended with repeat EGD in 2 months. Course of hospitalization complicated by poor oral intake, aspiration risk, and persistent tachycardia. Palliative medicine consultation for goals of care.   Assessment: Hx of cerebral palsy Intractable nausea/vomiting Esophageal candidiasis Esophagitis  Hiatal hernia Hypotension improving Persistent sinus tachycardia improving  Recommendations/Plan:  Further GOC discussion with mother/legal guardian at bedside Loretta Savage) on 03/14/20.  Mother does desire full code/full scope treatment. She would wish for resuscitation to be attempted and short-term intubation/mechanical ventilation if necessary. Mother would not wish for prolonged interventions (mechanical ventilation/trach) and seems to be leaning against feeding tube placement if it came to this.  Encouraged ongoing consideration of her goals and wishes for [place]. Encouraged she read Hard Choices booklet.   Continue current plan of care and medical management. Pending CT abdomen and lab work.   Outpatient GI follow-up.  Ongoing palliative support inpatient.  Goals of Care and Additional Recommendations:  Limitations on Scope of Treatment: Full Scope Treatment  Code Status: FULL   Code Status Orders  (From admission, onward)         Start     Ordered   03/06/20 2200  Full code  Continuous     03/06/20 2207        Code Status History    This patient has a current code status but no historical code status.   Advance Care Planning Activity    Advance Directive Documentation     Most Recent Value  Type of Advance Directive  Healthcare Power of Attorney  Pre-existing out of facility DNR order (yellow form or pink MOST form)  --  "MOST" Form in Place?  --       Prognosis:   Unable to determine  Discharge Planning:  To Be Determined  Care plan was discussed with mother Loretta Savage), RN, Dr. Posey Pronto, Asencion Gowda PMT NP   Thank you for allowing the Palliative Medicine Team to assist in the care of this patient.   Time In: 1125 Time Out: 1145 Total Time 20 Prolonged Time Billed  no       Greater than 50% of this time was spent counseling and coordinating care related to the above assessment and plan.   Ihor Dow, DNP, FNP-C Palliative Medicine Team  Phone: [phone] Fax: [phone]  Please contact Palliative Medicine Team phone at [phone] for questions and concerns.

## 2020-03-15 NOTE — TOC Progression Note (Signed)
Transition of Care [facility]) - Progression Note    Patient Details  Name: Loretta Savage MRN: [id number] Date of Birth: [birth year]  Transition of [facility]) CM/SW Contact  Burman Bruington, Gardiner Rhyme, LCSW Phone Number: 03/15/2020, 10:13 AM  Clinical Narrative:   Pt's diet has been changed to pureed nectar thick liquids, hopefully she will be able to swallow and not aspirate. Mom is on board with this and hopes daughter will take more po in. She has not been eating well since coming into the hospital.    Expected Discharge Plan: Home/Self Care Barriers to Discharge: Continued Medical Work up  Expected Discharge Plan and Services Expected Discharge Plan: Home/Self Care In-house Referral: Clinical Social Work     Living arrangements for the past 2 months: Single Family Home                                       Social Determinants of Health (SDOH) Interventions    Readmission Risk Interventions No flowsheet data found.

## 2020-03-16 LAB — URINALYSIS, COMPLETE (UACMP) WITH MICROSCOPIC
Bacteria, UA: NONE SEEN
Bilirubin Urine: NEGATIVE
Glucose, UA: NEGATIVE mg/dL
Hgb urine dipstick: NEGATIVE
Ketones, ur: NEGATIVE mg/dL
Nitrite: NEGATIVE
Protein, ur: NEGATIVE mg/dL
Specific Gravity, Urine: 1.011 (ref 1.005–1.030)
Squamous Epithelial / HPF: NONE SEEN (ref 0–5)
WBC, UA: >50 WBC/hpf — ABNORMAL HIGH (ref 0–5)
pH: 8 (ref 5.0–8.0)

## 2020-03-16 LAB — CULTURE, BLOOD (ROUTINE X 2): Culture: NO GROWTH

## 2020-03-16 LAB — PHOSPHORUS: Phosphorus: 3.5 mg/dL (ref 2.5–4.6)

## 2020-03-16 LAB — MAGNESIUM: Magnesium: 1.9 mg/dL (ref 1.7–2.4)

## 2020-03-16 LAB — LACTIC ACID, PLASMA: Lactic Acid, Venous: 1.5 mmol/L (ref 0.5–1.9)

## 2020-03-16 LAB — BRAIN NATRIURETIC PEPTIDE: B Natriuretic Peptide: 36.9 pg/mL (ref 0.0–100.0)

## 2020-03-16 MED ORDER — SODIUM CHLORIDE 0.9% FLUSH
10.0000 mL | Freq: Two times a day (BID) | INTRAVENOUS | Status: DC
  Administered 2020-03-16 – 2020-03-23 (×9): 10 mL

## 2020-03-16 MED ORDER — LORAZEPAM 2 MG/ML IJ SOLN
0.2500 mg | Freq: Once | INTRAMUSCULAR | Status: AC
  Administered 2020-03-16: 0.25 mg via INTRAVENOUS
  Filled 2020-03-16: qty 1

## 2020-03-16 MED ORDER — CHLORHEXIDINE GLUCONATE CLOTH 2 % EX PADS
6.0000 | MEDICATED_PAD | Freq: Every day | CUTANEOUS | Status: DC
  Administered 2020-03-16 – 2020-03-23 (×7): 6 via TOPICAL

## 2020-03-16 MED ORDER — SODIUM CHLORIDE 0.9% FLUSH: 10.0000 mL | INTRAVENOUS | Status: DC | PRN

## 2020-03-16 MED ORDER — SODIUM CHLORIDE 0.9 % IV BOLUS
500.0000 mL | Freq: Once | INTRAVENOUS | Status: AC
  Administered 2020-03-16: 500 mL via INTRAVENOUS

## 2020-03-16 MED ORDER — OLANZAPINE 5 MG PO TBDP
2.5000 mg | ORAL_TABLET | Freq: Every day | ORAL | Status: DC
  Administered 2020-03-16 – 2020-03-22 (×5): 2.5 mg via ORAL
  Filled 2020-03-16 (×8): qty 0.5

## 2020-03-16 MED ORDER — ALTEPLASE 2 MG IJ SOLR: 2.0000 mg | Freq: Once | INTRAMUSCULAR | Status: DC

## 2020-03-16 NOTE — Progress Notes (Signed)
[facility] Progress Note  Patient: Loretta Savage    [id number]  DOA: 03/06/2020     Date of Service: the patient was seen and examined on 03/16/2020  Chief Complaint  Patient presents with  . Emesis   Brief hospital course: Past medical history of cerebral palsy, GERD, anxiety, nonverbal at baseline.  Presented with intractable nausea and vomiting found to have candidal esophagitis confirmed on EGD.  Currently being treated with IV fluconazole.  Continues to have poor oral intake Currently further plan is supportive care.  Assessment and Plan: 1.  Candidal esophagitis. Intractable nausea and vomiting. Poor oral intake SP EGD. Positive considering budding yeast. Treated with IV Diflucan for now.  Patient unable to tolerate anything p.o. for now consistently. Continuing IV PPI as well. On discharge patient will need oral Prilosec 40 mg twice a day for 2 months with repeat EGD in 2 months. Return to GI office in 1 month with Dr. Alice Reichert. Unable to tolerate Carafate as unable to cooperate with care.  2.  History of anxiety. Agitation. Acute encephalopathy Agitation and encephalopathy likely from delirium and pain Patient also has history of anxiety and uses Xanax. Currently concern is patient receiving too many psychotropic medication making her somnolent leading to poor p.o. intake. Difficult to balance patient's symptoms with ongoing care requirement. IV as needed Haldol, scheduled Zyprexa for now. Pain control with IV morphine. Monitor response.  3.  Persistent sinus tachycardia with hypotension Patient was hypotensive likely from Cardura. Currently discontinued. Repeat CT abdomen pelvis to look for etiologies was negative for any acute abnormality on 03/15/2020. Chest x-ray on 03/16/2020 was also negative. Minimally elevated lactic acid normalizing very rapidly. D-dimer negative Procalcitonin negative suggesting no evidence of significant pneumonia. Blood  pressure is improved after discontinuation of the Cardura. We will reduce IV fluid rate. Symptomatic support.  4.  History of bladder retention Patient was on Cardura for that. Currently catheter is discontinued due to hypotension.  Monitor.  5.  Recurrent UTI Patient was on prophylaxis methenamine. Currently on hold. Monitor  6.  Constipation. Bowel regimen initiated. Dulcolax suppository based on the CT scan.  7.  Dysphagia At risk due to combination of deconditioning as well as somnolence. Speech therapy consult appreciated. Currently on dysphagia 1 diet with nectar thick liquid. Minimize medications.  8.  Goals of care discussion. Discussed with mother in detail regarding patient's poor prognosis given multiple comorbidities including dysphagia and esophagitis. At present recommendation is to focus more on comfort.  We will monitor.  Diet: Dysphagia 1 diet DVT Prophylaxis: Subcutaneous Lovenox   Advance goals of care discussion: Full code  Family Communication: family was present at bedside, at the time of interview.  Opportunity was given to ask question and all questions were answered satisfactorily.   Disposition:  Status is: Inpatient  Remains inpatient appropriate because:IV treatments appropriate due to intensity of illness or inability to take PO   Dispo: The patient is from: Home              Anticipated d/c is to: Home              Anticipated d/c date is: 3 days              Patient currently is not medically stable to d/c.         Subjective: Continues to have agitation.  On and off sleepiness.  No nausea no vomiting.  Still constipated.  Physical Exam: General:  alert not oriented  to time, place, and person.  Appear in mild distress, affect emotionally labile ENT: Oral Mucosa Clear, dry  Neck: difficult to assess  JVD,  Cardiovascular: S1 and S2 Present, no Murmur,  Respiratory: increased respiratory effort, Bilateral Air entry equal and  Decreased, no Crackles, no wheezes Abdomen: Bowel Sound present, Soft and difficult to assess  tenderness,  Skin: no rash Extremities: no Pedal edema, no calf tenderness Neurologic: Limited exam due to lack of cooperation of the patient Gait not checked due to patient safety concerns  Vitals:   03/15/20 2017 03/15/20 2125 03/16/20 0006 03/16/20 0230  BP: 132/68 122/74 98/65 139/76  Pulse: (!) 138 (!) 143 (!) 133 (!) 134  Resp: 20 18 18 18   Temp: 98.8 F (37.1 C) 99.7 F (37.6 C) 99.1 F (37.3 C) 98.9 F (37.2 C)  TempSrc: Oral Oral  Oral  SpO2: 97% 100% 95% 98%  Weight:      Height:        Intake/Output Summary (Last 24 hours) at 03/16/2020 0749 Last data filed at 03/16/2020 0500 Gross per 24 hour  Intake 2156.71 ml  Output 1200 ml  Net 956.71 ml   Filed Weights   03/06/20 1417  Weight: 47.6 kg    Data Reviewed: I have personally reviewed and interpreted daily labs, tele strips, imagings as discussed above. I reviewed all nursing notes, pharmacy notes, vitals, pertinent old records I have discussed plan of care as described above with RN and patient/family.  CBC: Recent Labs  Lab 03/11/20 0347 03/11/20 1233 03/12/20 0747 03/13/20 0334 03/14/20 0846 03/15/20 1208 03/15/20 2219  WBC 6.4   < > 4.7 5.6 6.6 5.0 8.1  NEUTROABS 3.8  --   --   --   --  2.3 6.4  HGB 9.8*   < > 10.3* 8.8* 9.2* 9.8* 10.3*  HCT 32.0*   < > 32.4* 29.5* 30.0* 31.5* 34.2*  MCV 95.8   < > 92.8 98.0 96.8 95.7 97.2  PLT 260   < > 177 223 218 223 274   < > = values in this interval not displayed.   Basic Metabolic Panel: Recent Labs  Lab 03/10/20 1551 03/10/20 1551 03/11/20 0347 03/11/20 0347 03/12/20 0747 03/13/20 0334 03/14/20 0846 03/15/20 1208 03/15/20 2219 03/16/20 0703  NA 139   < > 142   < > 143 144 138 139 140  --   K 4.1   < > 4.5   < > 4.6 4.1 4.2 3.4* 3.9  --   CL 105   < > 111   < > 109 107 104 106 105  --   CO2 27   < > 26   < > 29 29 29 26 25   --   GLUCOSE 129*   <  > 115*   < > 93 116* 98 117* 147*  --   BUN <5*   < > <5*   < > <5* 6 <5* 5* 9  --   CREATININE 0.43*   < > 0.31*   < > <0.30* 0.40* 0.32* 0.41* 0.64  --   CALCIUM 8.4*   < > 8.4*   < > 8.1* 9.0 8.7* 8.5* 8.9  --   MG  --   --  1.6*  --  1.9  --   --  1.7 1.8 1.9  PHOS 4.3  --  2.2*  --  2.5  --   --  2.2* 3.5  --    < > =  values in this interval not displayed.    Studies: DG Chest 1 View  Result Date: 03/15/2020 CLINICAL DATA:  54 year old female with tachycardia. EXAM: CHEST  1 VIEW COMPARISON:  Portable chest 03/13/2020 and earlier. FINDINGS: Portable AP semi upright view at 2142 hours. Chronic scoliosis with lower thoracic and lumbar posterior spinal rods. Stable borderline to mild cardiomegaly. Other mediastinal contours are within normal limits. Visualized tracheal air column is within normal limits. Allowing for portable technique the lungs are clear. No pneumothorax. Paucity of bowel gas in the upper abdomen. No acute osseous abnormality identified. IMPRESSION: No acute cardiopulmonary abnormality. Electronically Signed   By: Genevie Ann M.D.   On: 03/15/2020 21:51   CT ABDOMEN PELVIS W CONTRAST  Result Date: 03/15/2020 CLINICAL DATA:  Abdominal pain, intractable nausea and vomiting, candidal esophagitis EXAM: CT ABDOMEN AND PELVIS WITH CONTRAST TECHNIQUE: Multidetector CT imaging of the abdomen and pelvis was performed using the standard protocol following bolus administration of intravenous contrast. CONTRAST:  25mL OMNIPAQUE IOHEXOL 300 MG/ML  SOLN COMPARISON:  03/02/2020 FINDINGS: Lower chest: No acute abnormality. Small hiatal hernia, relatively reduced on current examination. Unchanged scarring or atelectasis of the left lung base. Hepatobiliary: No solid liver abnormality is seen. No gallstones, gallbladder wall thickening, or biliary dilatation. Pancreas: Unremarkable. No pancreatic ductal dilatation or surrounding inflammatory changes. Spleen: Normal in size without significant  abnormality. Adrenals/Urinary Tract: Adrenal glands are unremarkable. Kidneys are normal, without renal calculi, solid lesion, or hydronephrosis. Bladder is unremarkable. Stomach/Bowel: Stomach is within normal limits. Appendix appears normal. No evidence of bowel wall thickening, distention, or inflammatory changes. Dense stool balls in the rectum. Vascular/Lymphatic: No significant vascular findings are present. No enlarged abdominal or pelvic lymph nodes. Reproductive: No mass or other significant abnormality. Other: No abdominal wall hernia or abnormality. No abdominopelvic ascites. Musculoskeletal: No acute or significant osseous findings. Extensive posterior thoracolumbar fusion. IMPRESSION: 1. No acute CT findings of the abdomen or pelvis to explain pain or vomiting. 2. Small hiatal hernia, relatively reduced on current examination. Electronically Signed   By: Eddie Candle M.D.   On: 03/15/2020 13:44   [place] EKG SITE RITE  Result Date: 03/15/2020 If Site Rite image not attached, placement could not be confirmed due to current cardiac rhythm.   Scheduled Meds: . bisacodyl  10 mg Rectal BID  . enoxaparin (LOVENOX) injection  40 mg Subcutaneous Q24H  . feeding supplement (NEPRO CARB STEADY)  237 mL Oral TID WC  . fluticasone  2 spray Each Nare Daily  . OLANZapine zydis  5 mg Oral QHS  . pantoprazole (PROTONIX) IV  40 mg Intravenous Q12H  . polyethylene glycol  17 g Oral BID  . senna-docusate  2 tablet Oral BID  . sucralfate  1 g Oral TID WC & HS   Continuous Infusions: . dextrose 5% lactated ringers 125 mL/hr at 03/16/20 0500  . fluconazole (DIFLUCAN) IV Stopped (03/15/20 1130)   PRN Meds: acetaminophen **OR** acetaminophen, haloperidol lactate, morphine injection, ondansetron **OR** ondansetron (ZOFRAN) IV, oxyCODONE, phenol  Time spent: 35 minutes  Author: Berle Mull, MD [facility] 03/16/2020 7:49 AM  To reach On-call, see care teams to locate the attending and reach out to  them via [email or web address]. If 7PM-7AM, please contact night-coverage If you still have difficulty reaching the attending provider, please page the [facility] (Director on Call) for [facility] on amion for assistance.

## 2020-03-16 NOTE — Care Management Important Message (Signed)
Important Message  Patient Details  Name: Loretta Savage MRN: [id number] Date of Birth: 07/17/1966   Medicare Important Message Given:  Yes  Legal guardian, [name] was in room and reviewed Important Message from Medicare with her.   [name] 03/16/2020, 11:17 AM

## 2020-03-16 NOTE — Progress Notes (Signed)
Peripherally Inserted Central Catheter Placement  The IV Nurse has discussed with the patient and/or persons authorized to consent for the patient, the purpose of this procedure and the potential benefits and risks involved with this procedure.  The benefits include less needle sticks, lab draws from the catheter, and the patient may be discharged home with the catheter. Risks include, but not limited to, infection, bleeding, blood clot (thrombus formation), and puncture of an artery; nerve damage and irregular heartbeat and possibility to perform a PICC exchange if needed/ordered by physician.  Alternatives to this procedure were also discussed.  Bard Power PICC patient education guide, fact sheet on infection prevention and patient information card has been provided to patient /or left at bedside.  Consent signed by mother  PICC Placement Documentation  PICC Single Lumen [id number] PICC Left Basilic 36 cm 1 cm (Active)  Indication for Insertion or Continuance of Line Limited venous access - need for IV therapy >5 days (PICC only);Poor Vasculature-patient has had multiple peripheral attempts or PIVs lasting less than 24 hours 03/16/20 1749  Exposed Catheter (cm) 1 cm 03/16/20 1749  Site Assessment Clean;Dry;Intact 03/16/20 1749  Line Status Flushed;Saline locked;Blood return noted 03/16/20 1749  Dressing Type Transparent 03/16/20 1749  Dressing Status Clean;Dry;Intact;Antimicrobial disc in place 03/16/20 1749  Safety Lock Not Applicable Q000111Q [id number]  Line Care Connections checked and tightened 03/16/20 1749  Line Adjustment (NICU/IV Team Only) No 03/16/20 1749  Dressing Intervention New dressing 03/16/20 1749  Dressing Change Due 03/23/20 03/16/20 1749       Gordan Payment 03/16/2020, 5:50 PM

## 2020-03-16 NOTE — Progress Notes (Signed)
Daily Progress Note   Patient Name: Loretta Savage       Date: 03/16/2020 DOB: 12-16-1965  Age: 54 y.o. MRN#: [id number] Attending Physician: Lavina Hamman, MD Primary Care Physician: Olin Hauser, DO Admit Date: 03/06/2020  Reason for Consultation/Follow-up: Establishing goals of care  Subjective: Patient is resting in bed with mittens in place. Mother is at bedside. We discussed her daughter's status. She states she has been considering a feeding tube if one is offered. She states given her daughter's mental status, likelihood of self removal, and potential inability to remove the tube in the future, she would not want one placed. She is realistic about her daughter, and is mindful of her QOL.      Length of Stay: 9  Current Medications: Scheduled Meds:  . bisacodyl  10 mg Rectal BID  . enoxaparin (LOVENOX) injection  40 mg Subcutaneous Q24H  . feeding supplement (NEPRO CARB STEADY)  237 mL Oral TID WC  . fluticasone  2 spray Each Nare Daily  . OLANZapine zydis  5 mg Oral QHS  . pantoprazole (PROTONIX) IV  40 mg Intravenous Q12H  . polyethylene glycol  17 g Oral BID  . senna-docusate  2 tablet Oral BID  . sucralfate  1 g Oral TID WC & HS    Continuous Infusions: . dextrose 5% lactated ringers 50 mL/hr at 03/16/20 1137  . fluconazole (DIFLUCAN) IV Stopped (03/16/20 1348)    PRN Meds: acetaminophen **OR** acetaminophen, haloperidol lactate, morphine injection, ondansetron **OR** ondansetron (ZOFRAN) IV, oxyCODONE, phenol  Physical Exam Vitals and nursing note reviewed.  Constitutional:      Comments: Eyes open.   Pulmonary:     Effort: Pulmonary effort is normal. No tachypnea, accessory muscle usage or respiratory distress.  Skin:    General: Skin is warm and  dry.  Neurological:     Comments: Baseline non-verbal with CP/MR.             Vital Signs: BP 105/64 (BP Location: Left Arm)   Pulse (!) 110   Temp 98.5 F (36.9 C) (Oral)   Resp 18   Ht 4\' 5"  (1.346 m)   Wt 47.6 kg   LMP  (LMP Unknown)   SpO2 99%   BMI 26.28 kg/m  SpO2: SpO2: 99 % O2 Device: O2 Device:  Room Air O2 Flow Rate: O2 Flow Rate (L/min): 0 L/min  Intake/output summary:   Intake/Output Summary (Last 24 hours) at 03/16/2020 1438 Last data filed at 03/16/2020 0500 Gross per 24 hour  Intake 2156.71 ml  Output 1200 ml  Net 956.71 ml   LBM: Last BM Date: 03/15/20 Baseline Weight: Weight: 47.6 kg Most recent weight: Weight: 47.6 kg       Palliative Assessment/Data: PPS 30%    Flowsheet Rows     Most Recent Value  Intake Tab  Referral Department  Hospitalist  Unit at Time of Referral  Med/Surg Unit  Date Notified  03/10/20  Palliative Care Type  New Palliative care  Reason for referral  Clarify Goals of Care  Date of Admission  03/06/20  Date first seen by Palliative Care  03/13/20  # of days Palliative referral response time  3 Day(s)  # of days IP prior to Palliative referral  4  Clinical Assessment  Psychosocial & Spiritual Assessment  Palliative Care Outcomes      Patient Active Problem List   Diagnosis Date Noted  . Cerebral palsy ([place])   . Palliative care by specialist   . Goals of care, counseling/discussion   . Iron deficiency anemia due to chronic blood loss 03/11/2020  . Hypophosphatemia 03/11/2020  . Esophagitis 03/11/2020  . Esophageal candidiasis ([place]) 03/11/2020  . Hypotension 03/11/2020  . Sinus tachycardia 03/11/2020  . Hypomagnesemia 03/11/2020  . Intractable nausea and vomiting 03/06/2020  . Scoliosis 11/23/2019  . Bowel and bladder incontinence 11/23/2019  . 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 Anxiety 02/04/2018  . Insomnia 02/04/2018  . 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 GERD (gastroesophageal reflux disease) 02/04/2018  . 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 Seasonal allergies 02/04/2018  . Chronic interstitial cystitis 02/04/2018    Palliative Care Assessment & Plan   Patient Profile: 55 y.o. female  with past medical history of cerebral palsy, GERD, anxiety admitted on 03/06/2020 with intractable nausea and vomiting. GI following. EGD performed and found to have esophagitis with bleeding complicated by esophageal candidiasis. Medium sized hiatal hernia. On IV PPI and IV Diflucan. Outpatient GI recommended with repeat EGD in 2 months. Course of hospitalization complicated by poor oral intake, aspiration risk, and persistent tachycardia. Palliative medicine consultation for goals of care.   Recommendations/Plan:  Further GOC discussion with mother/legal guardian at bedside Lattie Corns) on 03/16/20. She would not want a feeding tube for her daughter if one were offered. She wants to continue current course of care at this time to see how she does, and is mindful of her daughter's QOL.       5/18:  Mother does desire full code/full scope treatment. She would wish for resuscitation to be attempted and short-term intubation/mechanical ventilation if necessary. Mother would not wish for prolonged interventions (mechanical ventilation/trach) and seems to be leaning against feeding tube placement if it came to this. Encouraged ongoing consideration of her goals and wishes for [place]. Encouraged she read [facility] Choices booklet.   Ongoing palliative support inpatient.  Goals of Care and Additional Recommendations:  Limitations on Scope of Treatment: Full Scope Treatment  Code Status: FULL   Code Status Orders  (From admission, onward)         Start     Ordered   03/06/20 2200  Full code  Continuous     03/06/20 2207  Code Status History    This patient has a current code status but no  historical code status.   Advance Care Planning Activity    Advance Directive Documentation     Most Recent Value  Type of Advance Directive  Healthcare Power of Attorney  Pre-existing out of facility DNR order (yellow form or pink MOST form)  --  "MOST" Form in Place?  --       Prognosis:   Unable to determine  Discharge Planning:  To Be Determined   Thank you for allowing the Palliative Medicine Team to assist in the care of this patient. Greater than 50% of this time was spent counseling and coordinating care related to the above assessment and plan.    Total Time 35 Prolonged Time Billed  no         Please contact Palliative Medicine Team phone at [phone] for questions and concerns.

## 2020-03-16 NOTE — Progress Notes (Signed)
   03/16/20 0006  Assess: MEWS Score  Temp 99.1 F (37.3 C)  BP 98/65  Pulse Rate (!) 133  Resp 18  SpO2 95 %  O2 Device Room Air  Assess: MEWS Score  MEWS Temp 0  MEWS Systolic 1  MEWS Pulse 3  MEWS RR 0  MEWS LOC 0  MEWS Score 4  MEWS Score Color Red  Assess: if the MEWS score is Yellow or Red  Were vital signs taken at a resting state? Yes  Focused Assessment Documented focused assessment  Early Detection of Sepsis Score *See Row Information* Medium  Treat  MEWS Interventions Other (Comment) (notified brenda Morriosn NP)  Take Vital Signs  Increase Vital Sign Frequency  Red: Q 1hr X 4 then Q 4hr X 4, if remains red, continue Q 4hrs  Escalate  MEWS: Escalate Red: discuss with charge nurse/RN and provider, consider discussing with RRT  Notify: Charge Nurse/RN  Name of Charge Nurse/RN Notified Wilder Glade   Date Charge Nurse/RN Notified 03/16/20  Time Charge Nurse/RN Notified 0031  Notify: Provider  Provider Name/Title Sharion Settler   Date Provider Notified 03/16/20  Time Provider Notified 0031  Notification Type  (secure chat)  Notification Reason Change in status  Response No new orders

## 2020-03-16 NOTE — Progress Notes (Signed)
Cross cover brief note Nurse reported worsening tachycardia and low grade fever with respiratory rate 24 . Mews score 4/5. Marland Kitchen  Infection source ruled out with negative chest xray and urinalysis without indication of infection. Lactic acid 3.0 but likely metabolic given her ongoing low grade fever. Recent blood cultures done 48 hours prior and therefore not repeated given stable blood pressure. Staph contaminant noted. Tachycardia unchanged with acetaminophen treatment for fever, and pain.  Patient noted to have spontaneous intermittent crying during care/assessment but no stress, pain identified.  Patients heart rate did improve back to around what her baseline had been, approx 120 after treated with low dose ativan.

## 2020-03-16 NOTE — Progress Notes (Signed)
Spoke with Dedra RN re PICC order.  States will need assistance to place if PICC still needed.  Dedra RN to speak with MD re need for PICC today.  States PIV working well at this time and requests to hold off on PICC placement until later time.

## 2020-03-17 ENCOUNTER — Telehealth: Payer: Self-pay | Admitting: Gastroenterology

## 2020-03-17 LAB — COMPREHENSIVE METABOLIC PANEL
ALT: 19 U/L (ref 0–44)
AST: 27 U/L (ref 15–41)
Albumin: 3 g/dL — ABNORMAL LOW (ref 3.5–5.0)
Alkaline Phosphatase: 56 U/L (ref 38–126)
Anion gap: 7 (ref 5–15)
BUN: 5 mg/dL — ABNORMAL LOW (ref 6–20)
CO2: 28 mmol/L (ref 22–32)
Calcium: 8.8 mg/dL — ABNORMAL LOW (ref 8.9–10.3)
Chloride: 107 mmol/L (ref 98–111)
Creatinine, Ser: 0.42 mg/dL — ABNORMAL LOW (ref 0.44–1.00)
GFR calc Af Amer: >60 mL/min (ref >60)
GFR calc non Af Amer: >60 mL/min (ref >60)
Glucose, Bld: 121 mg/dL — ABNORMAL HIGH (ref 70–99)
Potassium: 3.6 mmol/L (ref 3.5–5.1)
Sodium: 142 mmol/L (ref 135–145)
Total Bilirubin: 0.5 mg/dL (ref 0.3–1.2)
Total Protein: 6 g/dL — ABNORMAL LOW (ref 6.5–8.1)

## 2020-03-17 LAB — MAGNESIUM: Magnesium: 1.8 mg/dL (ref 1.7–2.4)

## 2020-03-17 LAB — PHOSPHORUS: Phosphorus: 2.2 mg/dL — ABNORMAL LOW (ref 2.5–4.6)

## 2020-03-17 MED ORDER — MAGIC MOUTHWASH W/LIDOCAINE: 10.0000 mL | Freq: Four times a day (QID) | ORAL | Status: DC

## 2020-03-17 MED ORDER — MORPHINE SULFATE (PF) 2 MG/ML IV SOLN
2.0000 mg | INTRAVENOUS | Status: DC | PRN
  Administered 2020-03-17 – 2020-03-23 (×8): 2 mg via INTRAVENOUS
  Filled 2020-03-17 (×8): qty 1

## 2020-03-17 MED ORDER — LORAZEPAM 2 MG/ML IJ SOLN
0.5000 mg | INTRAMUSCULAR | Status: DC | PRN
  Administered 2020-03-22 – 2020-03-23 (×3): 0.5 mg via INTRAVENOUS
  Filled 2020-03-17 (×3): qty 1

## 2020-03-17 MED ORDER — MAGIC MOUTHWASH
10.0000 mL | Freq: Four times a day (QID) | ORAL | Status: DC
  Administered 2020-03-18: 5 mL via ORAL
  Administered 2020-03-19 – 2020-03-22 (×6): 10 mL via ORAL
  Filled 2020-03-17 (×27): qty 10

## 2020-03-17 MED ORDER — LIDOCAINE VISCOUS HCL 2 % MT SOLN
10.0000 mL | Freq: Four times a day (QID) | OROMUCOSAL | Status: DC
  Administered 2020-03-17 – 2020-03-20 (×3): 10 mL via OROMUCOSAL
  Filled 2020-03-17 (×2): qty 15

## 2020-03-17 MED ORDER — MORPHINE SULFATE (CONCENTRATE) 10 MG/0.5ML PO SOLN
5.0000 mg | ORAL | Status: DC | PRN
  Administered 2020-03-20: 5 mg via ORAL
  Filled 2020-03-17: qty 0.5

## 2020-03-17 NOTE — Progress Notes (Signed)
[facility] Progress Note  Patient: Loretta Savage    [id number]  DOA: 03/06/2020     Date of Service: the patient was seen and examined on 03/17/2020  Chief Complaint  Patient presents with  . Emesis   Brief hospital course: Past medical history of cerebral palsy, GERD, anxiety, nonverbal at baseline.  Presented with intractable nausea and vomiting found to have candidal esophagitis confirmed on EGD.  Currently being treated with IV fluconazole.  Continues to have poor oral intake Currently further plan is supportive care.  Assessment and Plan: 1.  Candidal esophagitis. Intractable nausea and vomiting. Poor oral intake SP EGD. Positive considering budding yeast. Treated with IV Diflucan for now.  Patient unable to tolerate anything p.o. for now consistently. Continuing IV PPI as well. Currently day 11 of the treatment without any improvement in oral intake and with persistent tachycardia and pain leading to agitation. At present given ongoing symptoms it is unlikely that the patient will have any meaningful improvements in her oral intake going forward. During this time while we are waiting for improvement patient will remain at risk for recurrent infections including pneumonia and UTI and pressure ulcers. Given patient's poor nutritional status as well as dysphagia suggestion is to transition to hospice. Family is agreeable with the transition. We will consult [facility] for hospice facility.  The patient has improvement and the family desires aggressive care On discharge patient will need oral Prilosec 40 mg twice a day for 2 months with repeat EGD in 2 months. Return to GI office in 1 month with Dr. Alice Reichert. Unable to tolerate Carafate Add Magic mouthwash  2.  History of anxiety. Agitation. Acute encephalopathy Agitation and encephalopathy likely from delirium and pain Patient also has history of anxiety and uses Xanax. Currently concern is patient receiving too many  psychotropic medication making her somnolent leading to poor p.o. intake. Difficult to balance patient's symptoms with ongoing care requirement. IV as needed Haldol, scheduled Zyprexa for now. Pain control with IV morphine. Monitor response.  3.  Persistent sinus tachycardia with hypotension Patient was hypotensive likely from Cardura. Currently discontinued. Repeat CT abdomen pelvis to look for etiologies was negative for any acute abnormality on 03/15/2020. Chest x-ray on 03/16/2020 was also negative. Minimally elevated lactic acid normalizing very rapidly. D-dimer negative Procalcitonin negative suggesting no evidence of significant pneumonia. Blood pressure is improved after discontinuation of the Cardura. We will reduce IV fluid rate. Symptomatic support.  4.  History of bladder retention Patient was on Cardura for that. Currently it is discontinued due to hypotension.  Monitor.  5.  Recurrent UTI Patient was on prophylaxis methenamine. Currently on hold. Monitor  6.  Constipation. Bowel regimen initiated. Dulcolax suppository based on the CT scan.  7.  Dysphagia At risk due to combination of deconditioning as well as somnolence. Speech therapy consult appreciated. Currently on dysphagia 1 diet with nectar thick liquid. Minimize medications. We will liberalize the diet given the patient's prognosis now on comfort care.  8.  Goals of care discussion. Discussed with mother in detail regarding patient's poor prognosis given multiple comorbidities including dysphagia and esophagitis. At present recommendation is to focus more on comfort.  We will monitor.  Diet: Dysphagia 1 diet DVT Prophylaxis: Subcutaneous Lovenox   Advance goals of care discussion: Full code  Family Communication: family was present at bedside, at the time of interview.  Opportunity was given to ask question and all questions were answered satisfactorily.   Disposition:  Status is:  Inpatient  Remains inpatient appropriate because:IV treatments appropriate due to intensity of illness or inability to take PO   Dispo: The patient is from: Home              Anticipated d/c is to: Home              Anticipated d/c date is: 3 days              Patient currently is not medically stable to d/c.  Subjective: Excessive sleepiness.  No nausea no vomiting.  No fever no chills.  Physical Exam: General: Lethargic, nonverbal at baseline, not oriented to time, place, and person.  Appear in mild distress, affect emotionally labile ENT: Oral Mucosa Clear, dry  Neck: difficult to assess  JVD,  Cardiovascular: S1 and S2 Present, no Murmur,  Respiratory: increased respiratory effort, Bilateral Air entry equal and Decreased, no Crackles, no wheezes Abdomen: Bowel Sound present, Soft and difficult to assess  tenderness,  Skin: no rash Extremities: no Pedal edema, no calf tenderness Neurologic: Limited exam due to lack of cooperation of the patient Gait not checked due to patient safety concerns  Vitals:   03/17/20 0600 03/17/20 0737 03/17/20 1138 03/17/20 1716  BP:  107/74 116/73 131/77  Pulse:  (!) 115 (!) 112 (!) 126  Resp: 16 18 15 15   Temp:  99.4 F (37.4 C) 98.9 F (37.2 C) 99.2 F (37.3 C)  TempSrc:   Oral   SpO2:  99% 100% 98%  Weight:      Height:        Intake/Output Summary (Last 24 hours) at 03/17/2020 1837 Last data filed at 03/17/2020 1535 Gross per 24 hour  Intake 1160.68 ml  Output 225 ml  Net 935.68 ml   Filed Weights   03/06/20 1417  Weight: 47.6 kg    Data Reviewed: I have personally reviewed and interpreted daily labs, tele strips, imagings as discussed above. I reviewed all nursing notes, pharmacy notes, vitals, pertinent old records I have discussed plan of care as described above with RN and patient/family.  CBC: Recent Labs  Lab 03/11/20 0347 03/11/20 1233 03/12/20 0747 03/13/20 0334 03/14/20 0846 03/15/20 1208 03/15/20 2219  WBC  6.4   < > 4.7 5.6 6.6 5.0 8.1  NEUTROABS 3.8  --   --   --   --  2.3 6.4  HGB 9.8*   < > 10.3* 8.8* 9.2* 9.8* 10.3*  HCT 32.0*   < > 32.4* 29.5* 30.0* 31.5* 34.2*  MCV 95.8   < > 92.8 98.0 96.8 95.7 97.2  PLT 260   < > 177 223 218 223 274   < > = values in this interval not displayed.   Basic Metabolic Panel: Recent Labs  Lab 03/11/20 0347 03/11/20 0347 03/12/20 0747 03/12/20 0747 03/13/20 0334 03/14/20 0846 03/15/20 1208 03/15/20 2219 03/16/20 0703 03/17/20 1347  NA 142   < > 143   < > 144 138 139 140  --  142  K 4.5   < > 4.6   < > 4.1 4.2 3.4* 3.9  --  3.6  CL 111   < > 109   < > 107 104 106 105  --  107  CO2 26   < > 29   < > 29 29 26 25   --  28  GLUCOSE 115*   < > 93   < > 116* 98 117* 147*  --  121*  BUN <5*   < > <  5*   < > 6 <5* 5* 9  --  5*  CREATININE 0.31*   < > <0.30*   < > 0.40* 0.32* 0.41* 0.64  --  0.42*  CALCIUM 8.4*   < > 8.1*   < > 9.0 8.7* 8.5* 8.9  --  8.8*  MG 1.6*   < > 1.9  --   --   --  1.7 1.8 1.9 1.8  PHOS 2.2*  --  2.5  --   --   --  2.2* 3.5  --  2.2*   < > = values in this interval not displayed.    Studies: No results found.  Scheduled Meds: . bisacodyl  10 mg Rectal BID  . Chlorhexidine Gluconate Cloth  6 each Topical Daily  . enoxaparin (LOVENOX) injection  40 mg Subcutaneous Q24H  . feeding supplement (NEPRO CARB STEADY)  237 mL Oral TID WC  . fluticasone  2 spray Each Nare Daily  . OLANZapine zydis  2.5 mg Oral QHS  . pantoprazole (PROTONIX) IV  40 mg Intravenous Q12H  . senna-docusate  2 tablet Oral BID  . sodium chloride flush  10-40 mL Intracatheter Q12H  . sucralfate  1 g Oral TID WC & HS   Continuous Infusions: . dextrose 5% lactated ringers 50 mL/hr at 03/17/20 0846  . fluconazole (DIFLUCAN) IV Stopped (03/17/20 1200)   PRN Meds: acetaminophen **OR** acetaminophen, haloperidol lactate, morphine injection, morphine CONCENTRATE, ondansetron **OR** ondansetron (ZOFRAN) IV, phenol, sodium chloride flush  Time spent: 35  minutes  Author: Berle Mull, MD [facility] 03/17/2020 6:37 PM  To reach On-call, see care teams to locate the attending and reach out to them via [email or web address]. If 7PM-7AM, please contact night-coverage If you still have difficulty reaching the attending provider, please page the [facility] (Director on Call) for [facility] on amion for assistance.

## 2020-03-17 NOTE — Telephone Encounter (Signed)
Copied from [place] [phone]. Topic: General - Call Back - No Documentation >> Mar 17, 2020 12:08 PM Erick Blinks wrote: Reason for CRM: Pt's mother called Sunday Spillers and is requesting a call back from the nurses  Best contact: [phone]   Spoke to the patient's Mother Sunday Spillers about patient who is in the hospital wants a phone call from the provider regarding her health status.

## 2020-03-17 NOTE — Progress Notes (Addendum)
Daily Progress Note   Patient Name: Loretta Savage       Date: 03/17/2020 DOB: 03-Aug-1966  Age: 54 y.o. MRN#: [id number] Attending Physician: Lavina Hamman, MD Primary Care Physician: Olin Hauser, DO Admit Date: 03/06/2020  Reason for Consultation/Follow-up: Establishing goals of care  Subjective: Patient is resting in bed with mittens in place. Mother is at bedside. We discussed her daughter's status. She insightfully states that when her daughter is awake, she moans and yells in pain. When she's given medication to relieve the pain, she sleeps.  She states she understands her daughter will likely not make it out of this hospitalization, and feels it is time to stop life prolonging care. She discusses wanting hospice facility and comfort care, but upon understanding the IV fluid would be stopped with comfort care, she became very tearful, and stated "so that would be it", and stated she needs to speak with her family first.    If she decides on hospice facility or hospice in the home, please have SW/CM to place referral to hospice company.    ADDENDUM: Called back to bedside to talk with patient's mother and mother's niece. Status discussed as requested by mother. Questions answered about continuing care and hospice both. They would like to talk with other family members. Patient awoke during conversation and was moaning and grimacing temporarily.  Patient is hospice facility or home with hospice appropriate.     Length of Stay: 10  Current Medications: Scheduled Meds:  . bisacodyl  10 mg Rectal BID  . Chlorhexidine Gluconate Cloth  6 each Topical Daily  . enoxaparin (LOVENOX) injection  40 mg Subcutaneous Q24H  . feeding supplement (NEPRO CARB STEADY)  237 mL Oral TID WC    . fluticasone  2 spray Each Nare Daily  . OLANZapine zydis  2.5 mg Oral QHS  . pantoprazole (PROTONIX) IV  40 mg Intravenous Q12H  . senna-docusate  2 tablet Oral BID  . sodium chloride flush  10-40 mL Intracatheter Q12H  . sucralfate  1 g Oral TID WC & HS    Continuous Infusions: . dextrose 5% lactated ringers 50 mL/hr at 03/17/20 0846  . fluconazole (DIFLUCAN) IV 200 mg (03/17/20 1105)    PRN Meds: acetaminophen **OR** acetaminophen, haloperidol lactate, morphine injection, morphine CONCENTRATE, ondansetron **OR** ondansetron (ZOFRAN)  IV, phenol, sodium chloride flush  Physical Exam Vitals and nursing note reviewed.  Constitutional:      Comments: Blinking eyes.  Pulmonary:     Effort: Pulmonary effort is normal. No tachypnea, accessory muscle usage or respiratory distress.  Skin:    General: Skin is warm and dry.  Neurological:     Comments: Baseline non-verbal with CP/MR.             Vital Signs: BP 107/74 (BP Location: Right Arm)   Pulse (!) 115   Temp 99.4 F (37.4 C)   Resp 18   Ht 4\' 5"  (1.346 m)   Wt 47.6 kg   LMP  (LMP Unknown)   SpO2 99%   BMI 26.28 kg/m  SpO2: SpO2: 99 % O2 Device: O2 Device: Room Air O2 Flow Rate: O2 Flow Rate (L/min): 0 L/min  Intake/output summary:   Intake/Output Summary (Last 24 hours) at 03/17/2020 1134 Last data filed at 03/17/2020 0435 Gross per 24 hour  Intake 1367.65 ml  Output 225 ml  Net 1142.65 ml   LBM: Last BM Date: 03/15/20 Baseline Weight: Weight: 47.6 kg Most recent weight: Weight: 47.6 kg       Palliative Assessment/Data: PPS 30%    Flowsheet Rows     Most Recent Value  Intake Tab  Referral Department  Hospitalist  Unit at Time of Referral  Med/Surg Unit  Date Notified  03/10/20  Palliative Care Type  New Palliative care  Reason for referral  Clarify Goals of Care  Date of Admission  03/06/20  Date first seen by Palliative Care  03/13/20  # of days Palliative referral response time  3 Day(s)  # of  days IP prior to Palliative referral  4  Clinical Assessment  Psychosocial & Spiritual Assessment  Palliative Care Outcomes      Patient Active Problem List   Diagnosis Date Noted  . Cerebral palsy ([place])   . Palliative care by specialist   . Goals of care, counseling/discussion   . Iron deficiency anemia due to chronic blood loss 03/11/2020  . Hypophosphatemia 03/11/2020  . Esophagitis 03/11/2020  . Esophageal candidiasis ([place]) 03/11/2020  . Hypotension 03/11/2020  . Sinus tachycardia 03/11/2020  . Hypomagnesemia 03/11/2020  . Intractable nausea and vomiting 03/06/2020  . Scoliosis 11/23/2019  . Bowel and bladder incontinence 11/23/2019  . 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 Anxiety 02/04/2018  . Insomnia 02/04/2018  . 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 Spastic quadriplegic cerebral palsy ([place]) 02/04/2018  . GERD (gastroesophageal reflux disease) 02/04/2018  . 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 Seasonal allergies 02/04/2018  . Chronic interstitial cystitis 02/04/2018    Palliative Care Assessment & Plan   Patient Profile: 54 y.o. female  with past medical history of cerebral palsy, GERD, anxiety admitted on 03/06/2020 with intractable nausea and vomiting. GI following. EGD performed and found to have esophagitis with bleeding complicated by esophageal candidiasis. Medium sized hiatal hernia. On IV PPI and IV Diflucan. Outpatient GI recommended with repeat EGD in 2 months. Course of hospitalization complicated by poor oral intake, aspiration risk, and persistent tachycardia. Palliative medicine consultation for goals of care.   Recommendations/Plan:  Mother wants to talk with family about shifting to comfort care and going to hospice facility . Patient is appropriate for hospice facility if mother chooses.    If she decides on hospice facility or hospice in the home, please have SW/CM to place referral to hospice  company.  Code Status: FULL   Code Status Orders  (From admission, onward)         Start     Ordered   03/06/20 2200  Full code  Continuous     03/06/20 2207        Code Status History    This patient has a current code status but no historical code status.   Advance Care Planning Activity    Advance Directive Documentation     Most Recent Value  Type of Advance Directive  Healthcare Power of Attorney  Pre-existing out of facility DNR order (yellow form or pink MOST form)  --  "MOST" Form in Place?  --       Prognosis:  < 2 weeks without IV fluids and feeding tube. Did not eat or drink yesterday. Bites and sips the previous day.   Discharge Planning:  To Be Determined   Thank you for allowing the Palliative Medicine Team to assist in the care of this patient. Greater than 50% of this time was spent counseling and coordinating care related to the above assessment and plan.    Total Time 10:40-11:20= 40 min 3:10- 3:45= 35 min 75 min Prolonged Time Billed  yes         Please contact Palliative Medicine Team phone at [phone] for questions and concerns.

## 2020-03-17 NOTE — TOC Progression Note (Signed)
Transition of Care [facility]) - Progression Note    Patient Details  Name: Loretta Savage MRN: [id number] Date of Birth: Oct 15, 1966  Transition of Care [facility]) CM/SW Contact  Doyl Bitting, Gardiner Rhyme, LCSW Phone Number: 03/17/2020, 3:49 PM  Clinical Narrative: Met with Mom and niece who is here to discuss with [name] options. Mom reports she was in earlier to discuss possibility of going to hospice home. Mom wants to talk with other family members and decide. She is aware of her poor po intake and decline. Pt seems to not be groaning as much and hopefully in less pain. Have messaged Crystal to come back and talk with niece. Continue to work on best discharge plan for pt.     Expected Discharge Plan: Home/Self Care Barriers to Discharge: Continued Medical Work up  Expected Discharge Plan and Services Expected Discharge Plan: Home/Self Care In-house Referral: Clinical Social Work     Living arrangements for the past 2 months: Single Family Home                                       Social Determinants of Health (SDOH) Interventions    Readmission Risk Interventions No flowsheet data found.

## 2020-03-17 NOTE — Telephone Encounter (Signed)
Patient's mother called from patient's Hospital room [phone] and stated that her daughter isn't doing good and isn't eating or drinking fluids. (Patient is still currently in the Hospital)..Message was sent to the Provider.

## 2020-03-17 NOTE — Telephone Encounter (Signed)
Called patient's mother, Loretta Savage, and spoke with her for a little while about Loretta Savage's hospitalization and her current course. Provided empathetic listening and talked through what has happened so far and what the current recommendations are. We discussed difficult decision making and the quality of life concerns at hand. Ultimately, I advised her that I am here to help support and listen and will continue to follow along with her course in hospital. She should continue to work with hospital team and palliative team to determine the most appropriate course for Loretta Savage if she continues to poorly respond to treatment and does not take in any PO. Her prognosis remains poor, and the discussion at this time seems to be on hospice house or home with hospice.  Nobie Putnam, DO [facility] 03/17/2020, 6:04 PM

## 2020-03-18 LAB — CULTURE, BLOOD (ROUTINE X 2)
Culture: NO GROWTH
Culture: NO GROWTH
Special Requests: ADEQUATE
Special Requests: ADEQUATE

## 2020-03-18 NOTE — Plan of Care (Signed)
  Problem: Clinical Measurements: Goal: Will remain free from infection Outcome: Progressing Goal: Cardiovascular complication will be avoided Outcome: Progressing   

## 2020-03-18 NOTE — Progress Notes (Signed)
[facility] Progress Note  Patient: Loretta Savage    [id number]  DOA: 03/06/2020     Date of Service: the patient was seen and examined on 03/18/2020  Chief Complaint  Patient presents with  . Emesis   Brief hospital course: Past medical history of cerebral palsy, GERD, anxiety, nonverbal at baseline.  Presented with intractable nausea and vomiting found to have candidal esophagitis confirmed on EGD.  Currently being treated with IV fluconazole.  Continues to have poor oral intake Currently further plan is supportive care.  Assessment and Plan: 1.  Candidal esophagitis. Intractable nausea and vomiting. Poor oral intake SP EGD. Positive considering budding yeast. Treated with IV Diflucan for now.  Patient unable to tolerate anything p.o. for now consistently. Continuing IV PPI as well. Currently day 11 of the treatment without any improvement in oral intake and with persistent tachycardia and pain leading to agitation. At present given ongoing symptoms it is unlikely that the patient will have any meaningful improvements in her oral intake going forward. During this time while we are waiting for improvement patient will remain at risk for recurrent infections including pneumonia and UTI and pressure ulcers. Given patient's poor nutritional status as well as dysphagia suggestion is to transition to hospice. Family is agreeable with the transition. We will consult [facility] for hospice facility.  The patient has improvement and the family desires aggressive care On discharge patient will need oral Prilosec 40 mg twice a day for 2 months with repeat EGD in 2 months. Return to GI office in 1 month with Dr. Alice Reichert. Unable to tolerate Carafate Add Magic mouthwash  2.  History of anxiety. Agitation. Acute encephalopathy Agitation and encephalopathy likely from delirium and pain Patient also has history of anxiety and uses Xanax. Currently concern is patient receiving too many  psychotropic medication making her somnolent leading to poor p.o. intake. Difficult to balance patient's symptoms with ongoing care requirement. IV as needed Haldol, scheduled Zyprexa for now. Pain control with IV morphine. Monitor response.  3.  Persistent sinus tachycardia with hypotension Patient was hypotensive likely from Cardura. Currently discontinued. Repeat CT abdomen pelvis to look for etiologies was negative for any acute abnormality on 03/15/2020. Chest x-ray on 03/16/2020 was also negative. Minimally elevated lactic acid normalizing very rapidly. D-dimer negative Procalcitonin negative suggesting no evidence of significant pneumonia. Blood pressure is improved after discontinuation of the Cardura. We will reduce IV fluid rate. Symptomatic support.  4.  History of bladder retention Patient was on Cardura for that. Currently it is discontinued due to hypotension.  Monitor.  5.  Recurrent UTI Patient was on prophylaxis methenamine. Currently on hold. Monitor  6.  Constipation. Bowel regimen initiated. Dulcolax suppository based on the CT scan.  7.  Dysphagia At risk due to combination of deconditioning as well as somnolence. Speech therapy consult appreciated. Currently on dysphagia 1 diet with nectar thick liquid. Minimize medications. We will liberalize the diet given the patient's prognosis now on comfort care.  8.  Goals of care discussion. Discussed with mother in detail regarding patient's poor prognosis given multiple comorbidities including dysphagia and esophagitis. At present recommendation is to focus more on comfort.   Had a repeat family meeting later in the afternoon of 03/17/2020.  Currently the goal is to transition to residential hospice with comfort care. Family would like to complete the course of the antibiotics which is supposed to be finished on 03/23/2020  Diet: Dysphagia 1 diet DVT Prophylaxis: Subcutaneous Lovenox   Advance  goals of care  discussion: Full code  Family Communication: family was present at bedside, at the time of interview.  Opportunity was given to ask question and all questions were answered satisfactorily.   Disposition:  Status is: Inpatient  Remains inpatient appropriate because:IV treatments appropriate due to intensity of illness or inability to take PO   Dispo: The patient is from: Home              Anticipated d/c is to: Home              Anticipated d/c date is: 3 days              Patient currently is not medically stable to d/c.  Subjective: Excessive sleepiness.  No nausea no vomiting.  No fever no chills.  Physical Exam: General: Lethargic, nonverbal at baseline, not oriented to time, place, and person.  Appear in mild distress, affect emotionally labile ENT: Oral Mucosa Clear, dry  Neck: difficult to assess  JVD,  Cardiovascular: S1 and S2 Present, no Murmur,  Respiratory: increased respiratory effort, Bilateral Air entry equal and Decreased, no Crackles, no wheezes Abdomen: Bowel Sound present, Soft and difficult to assess  tenderness,  Skin: no rash Extremities: no Pedal edema, no calf tenderness Neurologic: Limited exam due to lack of cooperation of the patient Gait not checked due to patient safety concerns  Vitals:   03/17/20 1138 03/17/20 1716 03/17/20 2206 03/18/20 0754  BP: 116/73 131/77 (!) 137/55 116/66  Pulse: (!) 112 (!) 126 75 (!) 124  Resp: 15 15  18   Temp: 98.9 F (37.2 C) 99.2 F (37.3 C)  98.9 F (37.2 C)  TempSrc: Oral   Oral  SpO2: 100% 98%  96%  Weight:      Height:        Intake/Output Summary (Last 24 hours) at 03/18/2020 1601 Last data filed at 03/18/2020 1543 Gross per 24 hour  Intake 1315.66 ml  Output 350 ml  Net 965.66 ml   Filed Weights   03/06/20 1417  Weight: 47.6 kg    Data Reviewed: I have personally reviewed and interpreted daily labs, tele strips, imagings as discussed above. I reviewed all nursing notes, pharmacy notes, vitals,  pertinent old records I have discussed plan of care as described above with RN and patient/family.  CBC: Recent Labs  Lab 03/12/20 0747 03/13/20 0334 03/14/20 0846 03/15/20 1208 03/15/20 2219  WBC 4.7 5.6 6.6 5.0 8.1  NEUTROABS  --   --   --  2.3 6.4  HGB 10.3* 8.8* 9.2* 9.8* 10.3*  HCT 32.4* 29.5* 30.0* 31.5* 34.2*  MCV 92.8 98.0 96.8 95.7 97.2  PLT 177 223 218 223 [id number]   Basic Metabolic Panel: Recent Labs  Lab 03/12/20 0747 03/12/20 0747 03/13/20 0334 03/14/20 0846 03/15/20 1208 03/15/20 2219 03/16/20 0703 03/17/20 1347  NA 143   < > 144 138 139 140  --  142  K 4.6   < > 4.1 4.2 3.4* 3.9  --  3.6  CL 109   < > 107 104 106 105  --  107  CO2 29   < > 29 29 26 25   --  28  GLUCOSE 93   < > 116* 98 117* 147*  --  121*  BUN <5*   < > 6 <5* 5* 9  --  5*  CREATININE <0.30*   < > 0.40* 0.32* 0.41* 0.64  --  0.42*  CALCIUM 8.1*   < > 9.0  8.7* 8.5* 8.9  --  8.8*  MG 1.9  --   --   --  1.7 1.8 1.9 1.8  PHOS 2.5  --   --   --  2.2* 3.5  --  2.2*   < > = values in this interval not displayed.    Studies: No results found.  Scheduled Meds: . bisacodyl  10 mg Rectal BID  . Chlorhexidine Gluconate Cloth  6 each Topical Daily  . feeding supplement (NEPRO CARB STEADY)  237 mL Oral TID WC  . fluticasone  2 spray Each Nare Daily  . magic mouthwash  10 mL Oral QID   And  . lidocaine  10 mL Mouth/Throat QID  . OLANZapine zydis  2.5 mg Oral QHS  . pantoprazole (PROTONIX) IV  40 mg Intravenous Q12H  . senna-docusate  2 tablet Oral BID  . sodium chloride flush  10-40 mL Intracatheter Q12H  . sucralfate  1 g Oral TID WC & HS   Continuous Infusions: . dextrose 5% lactated ringers 50 mL/hr at 03/18/20 0624  . fluconazole (DIFLUCAN) IV 200 mg (03/18/20 1039)   PRN Meds: acetaminophen **OR** acetaminophen, haloperidol lactate, LORazepam, morphine injection, morphine CONCENTRATE, ondansetron **OR** ondansetron (ZOFRAN) IV, phenol, sodium chloride flush  Time spent: 35  minutes  Author: Berle Mull, MD [facility] 03/18/2020 4:01 PM  To reach On-call, see care teams to locate the attending and reach out to them via [email or web address]. If 7PM-7AM, please contact night-coverage If you still have difficulty reaching the attending provider, please page the [facility] (Director on Call) for [facility] on amion for assistance.

## 2020-03-19 ENCOUNTER — Inpatient Hospital Stay: Payer: Medicare Other

## 2020-03-19 LAB — CBC WITH DIFFERENTIAL/PLATELET
Abs Immature Granulocytes: 0.03 10*3/uL (ref 0.00–0.07)
Basophils Absolute: 0 10*3/uL (ref 0.0–0.1)
Basophils Relative: 0 %
Eosinophils Absolute: 0.2 10*3/uL (ref 0.0–0.5)
Eosinophils Relative: 4 %
HCT: 29.6 % — ABNORMAL LOW (ref 36.0–46.0)
Hemoglobin: 9.1 g/dL — ABNORMAL LOW (ref 12.0–15.0)
Immature Granulocytes: 1 %
Lymphocytes Relative: 32 %
Lymphs Abs: 1.5 10*3/uL (ref 0.7–4.0)
MCH: 30.1 pg (ref 26.0–34.0)
MCHC: 30.7 g/dL (ref 30.0–36.0)
MCV: 98 fL (ref 80.0–100.0)
Monocytes Absolute: 0.4 10*3/uL (ref 0.1–1.0)
Monocytes Relative: 8 %
Neutro Abs: 2.6 10*3/uL (ref 1.7–7.7)
Neutrophils Relative %: 55 %
Platelets: 222 10*3/uL (ref 150–400)
RBC: 3.02 MIL/uL — ABNORMAL LOW (ref 3.87–5.11)
RDW: 16.5 % — ABNORMAL HIGH (ref 11.5–15.5)
WBC: 4.7 10*3/uL (ref 4.0–10.5)
nRBC: 0 % (ref 0.0–0.2)

## 2020-03-19 LAB — COMPREHENSIVE METABOLIC PANEL
ALT: 16 U/L (ref 0–44)
AST: 24 U/L (ref 15–41)
Albumin: 2.6 g/dL — ABNORMAL LOW (ref 3.5–5.0)
Alkaline Phosphatase: 54 U/L (ref 38–126)
Anion gap: 4 — ABNORMAL LOW (ref 5–15)
BUN: 7 mg/dL (ref 6–20)
CO2: 29 mmol/L (ref 22–32)
Calcium: 8.3 mg/dL — ABNORMAL LOW (ref 8.9–10.3)
Chloride: 108 mmol/L (ref 98–111)
Creatinine, Ser: <0.3 mg/dL — ABNORMAL LOW (ref 0.44–1.00)
Glucose, Bld: 106 mg/dL — ABNORMAL HIGH (ref 70–99)
Potassium: 3.3 mmol/L — ABNORMAL LOW (ref 3.5–5.1)
Sodium: 141 mmol/L (ref 135–145)
Total Bilirubin: 0.4 mg/dL (ref 0.3–1.2)
Total Protein: 5.5 g/dL — ABNORMAL LOW (ref 6.5–8.1)

## 2020-03-19 MED ORDER — NEPRO/CARBSTEADY PO LIQD
237.0000 mL | Freq: Three times a day (TID) | ORAL | Status: DC
  Administered 2020-03-19 – 2020-03-23 (×11): 237 mL via ORAL

## 2020-03-19 NOTE — Progress Notes (Signed)
   03/19/20 1938  Assess: MEWS Score  Temp 99.2 F (37.3 C)  BP 112/78  Pulse Rate (!) 118  Resp 18  Level of Consciousness Alert  SpO2 97 %  O2 Device Room Air  Assess: MEWS Score  MEWS Temp 0  MEWS Systolic 0  MEWS Pulse 2  MEWS RR 0  MEWS LOC 0  MEWS Score 2  MEWS Score Color Yellow  Assess: if the MEWS score is Yellow or Red  Were vital signs taken at a resting state? Yes  Focused Assessment Documented focused assessment  Early Detection of Sepsis Score *See Row Information* Low  Treat  MEWS Interventions Administered prn meds/treatments  Take Vital Signs  Increase Vital Sign Frequency  Yellow: Q 2hr X 2 then Q 4hr X 2, if remains yellow, continue Q 4hrs  Escalate  MEWS: Escalate Yellow: discuss with charge nurse/RN and consider discussing with provider and RRT  Notify: Charge Nurse/RN  Name of Charge Nurse/RN Notified Stanton Kidney  Date Charge Nurse/RN Notified 03/19/20  Time Charge Nurse/RN Notified 1944

## 2020-03-19 NOTE — Plan of Care (Signed)
  Problem: Clinical Measurements: Goal: Ability to maintain clinical measurements within normal limits will improve Outcome: Progressing Goal: Will remain free from infection Outcome: Progressing Goal: Cardiovascular complication will be avoided Outcome: Progressing   Problem: Nutrition: Goal: Adequate nutrition will be maintained Outcome: Progressing   Problem: Elimination: Goal: Will not experience complications related to urinary retention Outcome: Progressing   Problem: Pain Managment: Goal: General experience of comfort will improve Outcome: Progressing

## 2020-03-19 NOTE — TOC Progression Note (Signed)
Transition of Care [facility]) - Progression Note    Patient Details  Name: Loretta Savage MRN: [id number] Date of Birth: 04-Apr-1966  Transition of Care [facility]) CM/SW Contact  Boris Sharper, LCSW Phone Number: 03/19/2020, 4:10 PM  Clinical Narrative:    CSW tries to call pt's legal guardian to follow up on decision about hospice. No answer, unable to leave message, phone just kept ringing. CSW called pt's room and [name]. Loretta Savage stated that the pt is now eating and does not want the pt to go at this time. Loretta Savage stated that she was torn in between the two and would like to think about it. CSW notified her that someone would follow up with her tomorrow.    Expected Discharge Plan: Home/Self Care Barriers to Discharge: Continued Medical Work up  Expected Discharge Plan and Services Expected Discharge Plan: Home/Self Care In-house Referral: Clinical Social Work     Living arrangements for the past 2 months: Single Family Home                                       Social Determinants of Health (SDOH) Interventions    Readmission Risk Interventions No flowsheet data found.

## 2020-03-19 NOTE — TOC Progression Note (Signed)
Transition of Care [facility]) - Progression Note    Patient Details  Name: Loretta Savage MRN: [id number] Date of Birth: 1966/03/31  Transition of Care [facility]) CM/SW Contact  Boris Sharper, LCSW Phone Number: 03/19/2020, 4:07 PM  Clinical Narrative:    CSW consulted by MD about Hospice referral for pt. CSW tried to contact pt's family/ legal guardian in regards to their decision about hospice. No answer, will continue to follow   Expected Discharge Plan: Home/Self Care Barriers to Discharge: Continued Medical Work up  Expected Discharge Plan and Services Expected Discharge Plan: Home/Self Care In-house Referral: Clinical Social Work     Living arrangements for the past 2 months: Single Family Home                                       Social Determinants of Health (SDOH) Interventions    Readmission Risk Interventions No flowsheet data found.

## 2020-03-19 NOTE — Progress Notes (Signed)
[facility] Progress Note  Patient: Loretta Savage    [id number]  DOA: 03/06/2020     Date of Service: the patient was seen and examined on 03/19/2020  Chief Complaint  Patient presents with  . Emesis   Brief hospital course: Past medical history of cerebral palsy, GERD, anxiety, nonverbal at baseline.  Presented with intractable nausea and vomiting found to have candidal esophagitis confirmed on EGD.  Currently being treated with IV fluconazole.  Continues to have poor oral intake Currently further plan is supportive care.  Assessment and Plan: 1.  Candidal esophagitis. Intractable nausea and vomiting. Poor oral intake SP EGD. Positive considering budding yeast. Treated with IV Diflucan for now.  Patient unable to tolerate anything p.o. for now consistently. Continuing IV PPI as well. Currently day 11 of the treatment without any improvement in oral intake and with persistent tachycardia and pain leading to agitation. At present given ongoing symptoms it is unlikely that the patient will have any meaningful improvements in her oral intake going forward. During this time while we are waiting for improvement patient will remain at risk for recurrent infections including pneumonia and UTI and pressure ulcers. Given patient's poor nutritional status as well as dysphagia suggestion is to transition to hospice. Family initially agreeable for transition to hospice. On 03/19/2020 family reported the patient is drinking 4 Nepro shakes and suggest there is improvement in her oral intake. She also had a bowel movement reportedly. Given this information family wants to hold off on transition and wants to monitor. We will check CBC and CMP  If the patient has improvement and the family desires aggressive care On discharge patient will need oral Prilosec 40 mg twice a day for 2 months with repeat EGD in 2 months. Return to GI office in 1 month with Dr. Alice Reichert. Unable to tolerate  Carafate Add Magic mouthwash  2.  History of anxiety. Agitation. Acute encephalopathy Agitation and encephalopathy likely from delirium and pain Patient also has history of anxiety and uses Xanax. Currently concern is patient receiving too many psychotropic medication making her somnolent leading to poor p.o. intake. Difficult to balance patient's symptoms with ongoing care requirement. IV as needed Haldol, scheduled Zyprexa for now. Pain control with IV morphine. Monitor response.  3.  Persistent sinus tachycardia with hypotension Patient was hypotensive likely from Cardura. Currently discontinued. Repeat CT abdomen pelvis to look for etiologies was negative for any acute abnormality on 03/15/2020. Chest x-ray on 03/16/2020 was also negative. Minimally elevated lactic acid normalizing very rapidly. D-dimer negative Procalcitonin negative suggesting no evidence of significant pneumonia. Blood pressure is improved after discontinuation of the Cardura. We will reduce IV fluid rate. Symptomatic support.  4.  History of bladder retention Patient was on Cardura for that. Currently it is discontinued due to hypotension.  Monitor.  5.  Recurrent UTI Patient was on prophylaxis methenamine. Currently on hold. Monitor  6.  Constipation. Bowel regimen initiated. Dulcolax suppository based on the CT scan.  7.  Dysphagia At risk due to combination of deconditioning as well as somnolence. Speech therapy consult appreciated. Currently on dysphagia 1 diet with nectar thick liquid. Minimize medications. We will liberalize the diet given the patient's prognosis now on comfort care.  8.  Goals of care discussion. Discussed with mother in detail regarding patient's poor prognosis given multiple comorbidities including dysphagia and esophagitis. At present recommendation is to focus more on comfort.   Had a repeat family meeting later in the afternoon of 03/17/2020.  Currently the goal is to  transition to residential hospice with comfort care. Family would like to complete the course of the antibiotics which is supposed to be finished on 03/23/2020 Currently mother wants to hold off on transition to hospice as reportedly patient has drinking more Nepro. We will monitor response.  9.  Right upper extremity edema. We will check with for Doppler DVT.  Diet: Dysphagia 1 diet DVT Prophylaxis: Subcutaneous Lovenox   Advance goals of care discussion: Full code  Family Communication: family was present at bedside, at the time of interview.  Opportunity was given to ask question and all questions were answered satisfactorily.   Disposition:  Status is: Inpatient  Remains inpatient appropriate because:IV treatments appropriate due to intensity of illness or inability to take PO   Dispo: The patient is from: Home              Anticipated d/c is to: Home              Anticipated d/c date is: 3 days              Patient currently is not medically stable to d/c.  Subjective: Patient still remains sleepy and drowsy.  Reportedly has not had anything to eat since morning when I saw her around noontime. But mother reports that the patient had 4 Nepro shakes yesterday.  Physical Exam: General: drowsy lethargic, nonverbal at baseline, not oriented to time, place, and person.  Appear in mild distress, affect emotionally labile ENT: Oral Mucosa Clear, dry  Neck: difficult to assess  JVD,  Cardiovascular: S1 and S2 Present, no Murmur,  Respiratory: increased respiratory effort, Bilateral Air entry equal and Decreased, no Crackles, no wheezes Abdomen: Bowel Sound present, Soft and difficult to assess  tenderness,  Skin: no rash Extremities: no Pedal edema, no calf tenderness, right upper extremity edema Neurologic: Limited exam due to lack of cooperation of the patient Gait not checked due to patient safety concerns  Vitals:   03/18/20 2000 03/18/20 2355 03/19/20 0900 03/19/20 1558    BP: (!) 102/57 (!) 115/55 110/65 139/77  Pulse: (!) 111 (!) 125 (!) 123 (!) 121  Resp: 18 18 18 14   Temp: 98.6 F (37 C) 99.3 F (37.4 C) 98.9 F (37.2 C) 99.3 F (37.4 C)  TempSrc: Oral Oral Oral Oral  SpO2: 98% 95% 96% 98%  Weight: 47.6 kg     Height: 4\' 5"  (1.346 m)       Intake/Output Summary (Last 24 hours) at 03/19/2020 1901 Last data filed at 03/19/2020 1700 Gross per 24 hour  Intake 2024.93 ml  Output 800 ml  Net 1224.93 ml   Filed Weights   03/06/20 1417 03/18/20 2000  Weight: 47.6 kg 47.6 kg    Data Reviewed: I have personally reviewed and interpreted daily labs, tele strips, imagings as discussed above. I reviewed all nursing notes, pharmacy notes, vitals, pertinent old records I have discussed plan of care as described above with RN and patient/family.  CBC: Recent Labs  Lab 03/13/20 0334 03/14/20 0846 03/15/20 1208 03/15/20 2219 03/19/20 1139  WBC 5.6 6.6 5.0 8.1 4.7  NEUTROABS  --   --  2.3 6.4 2.6  HGB 8.8* 9.2* 9.8* 10.3* 9.1*  HCT 29.5* 30.0* 31.5* 34.2* 29.6*  MCV 98.0 96.8 95.7 97.2 98.0  PLT 223 218 223 274 [id number]   Basic Metabolic Panel: Recent Labs  Lab 03/14/20 0846 03/15/20 1208 03/15/20 2219 03/16/20 0703 03/17/20 1347 03/19/20 1139  NA  138 139 140  --  142 141  K 4.2 3.4* 3.9  --  3.6 3.3*  CL 104 106 105  --  107 108  CO2 29 26 25   --  28 29  GLUCOSE 98 117* 147*  --  121* 106*  BUN <5* 5* 9  --  5* 7  CREATININE 0.32* 0.41* 0.64  --  0.42* <0.30*  CALCIUM 8.7* 8.5* 8.9  --  8.8* 8.3*  MG  --  1.7 1.8 1.9 1.8  --   PHOS  --  2.2* 3.5  --  2.2*  --     Studies: US Venous Img Upper Uni Right(DVT)  Result Date: 03/19/2020 CLINICAL DATA:  54 year old with right upper extremity edema. EXAM: RIGHT UPPER EXTREMITY VENOUS DOPPLER ULTRASOUND TECHNIQUE: Gray-scale sonography with graded compression, as well as color Doppler and duplex ultrasound were performed to evaluate the upper extremity deep venous system from the level of the  subclavian vein and including the jugular, axillary, basilic, radial, ulnar and upper cephalic vein. Spectral Doppler was utilized to evaluate flow at rest and with distal augmentation maneuvers. COMPARISON:  None. FINDINGS: Contralateral Subclavian Vein: Color Doppler flow in the left subclavian vein. Echogenic tubular structure within the vein and reportedly the patient has a PICC line on the left side. Internal Jugular Vein: No evidence of thrombus. Normal compressibility, color Doppler flow and phasicity. Subclavian Vein: No evidence of thrombus. Normal compressibility, respiratory phasicity and response to augmentation. Axillary Vein: No evidence of thrombus. Normal compressibility, respiratory phasicity and response to augmentation. Cephalic Vein: No evidence of thrombus. Normal compressibility, respiratory phasicity and response to augmentation. Basilic Vein: No evidence of thrombus. Normal compressibility, respiratory phasicity and response to augmentation. Brachial Veins: No evidence of thrombus. Normal compressibility, respiratory phasicity and response to augmentation. Radial Veins: No evidence of thrombus. Normal compressibility and color Doppler flow. Ulnar Veins: No evidence of thrombus. Normal compressibility and color Doppler flow. Other Findings:  None visualized. IMPRESSION: No evidence of DVT within the right upper extremity. Electronically Signed   By: Markus Daft M.D.   On: 03/19/2020 18:38    Scheduled Meds: . bisacodyl  10 mg Rectal BID  . Chlorhexidine Gluconate Cloth  6 each Topical Daily  . feeding supplement (NEPRO CARB STEADY)  237 mL Oral TID WC & HS  . fluticasone  2 spray Each Nare Daily  . magic mouthwash  10 mL Oral QID   And  . lidocaine  10 mL Mouth/Throat QID  . OLANZapine zydis  2.5 mg Oral QHS  . pantoprazole (PROTONIX) IV  40 mg Intravenous Q12H  . senna-docusate  2 tablet Oral BID  . sodium chloride flush  10-40 mL Intracatheter Q12H  . sucralfate  1 g Oral TID WC  & HS   Continuous Infusions: . dextrose 5% lactated ringers 50 mL/hr at 03/18/20 0624  . fluconazole (DIFLUCAN) IV 200 mg (03/19/20 0933)   PRN Meds: acetaminophen **OR** acetaminophen, haloperidol lactate, LORazepam, morphine injection, morphine CONCENTRATE, ondansetron **OR** ondansetron (ZOFRAN) IV, phenol, sodium chloride flush  Time spent: 35 minutes  Author: Berle Mull, MD [facility] 03/19/2020 7:01 PM  To reach On-call, see care teams to locate the attending and reach out to them via [email or web address]. If 7PM-7AM, please contact night-coverage If you still have difficulty reaching the attending provider, please page the [facility] (Director on Call) for [facility] on amion for assistance.

## 2020-03-19 NOTE — Plan of Care (Signed)
  Problem: Nutrition: Goal: Adequate nutrition will be maintained Outcome: Progressing   Problem: Elimination: Goal: Will not experience complications related to urinary retention Outcome: Progressing   Problem: Pain Managment: Goal: General experience of comfort will improve Outcome: Progressing   

## 2020-03-20 LAB — BASIC METABOLIC PANEL
Anion gap: 7 (ref 5–15)
BUN: 5 mg/dL — ABNORMAL LOW (ref 6–20)
CO2: 28 mmol/L (ref 22–32)
Calcium: 8.4 mg/dL — ABNORMAL LOW (ref 8.9–10.3)
Chloride: 105 mmol/L (ref 98–111)
Creatinine, Ser: 0.37 mg/dL — ABNORMAL LOW (ref 0.44–1.00)
GFR calc Af Amer: >60 mL/min (ref >60)
GFR calc non Af Amer: >60 mL/min (ref >60)
Glucose, Bld: 108 mg/dL — ABNORMAL HIGH (ref 70–99)
Potassium: 3.6 mmol/L (ref 3.5–5.1)
Sodium: 140 mmol/L (ref 135–145)

## 2020-03-20 LAB — MAGNESIUM: Magnesium: 1.8 mg/dL (ref 1.7–2.4)

## 2020-03-20 MED ORDER — PANTOPRAZOLE SODIUM 40 MG PO PACK
40.0000 mg | PACK | Freq: Two times a day (BID) | ORAL | Status: DC
  Administered 2020-03-22 (×2): 40 mg via ORAL
  Filled 2020-03-20 (×8): qty 20

## 2020-03-20 MED ORDER — LORAZEPAM 2 MG/ML IJ SOLN: 1.0000 mg | INTRAMUSCULAR | Status: DC | PRN

## 2020-03-20 MED ORDER — LORAZEPAM 2 MG/ML IJ SOLN
1.0000 mg | Freq: Once | INTRAMUSCULAR | Status: AC
  Administered 2020-03-20: 1 mg via INTRAVENOUS

## 2020-03-20 MED ORDER — FLUCONAZOLE 40 MG/ML PO SUSR
200.0000 mg | Freq: Every day | ORAL | Status: DC
  Filled 2020-03-20 (×4): qty 5

## 2020-03-20 MED ORDER — LORAZEPAM 2 MG/ML IJ SOLN
INTRAMUSCULAR | Status: AC
  Filled 2020-03-20: qty 1

## 2020-03-20 NOTE — TOC Progression Note (Addendum)
Transition of Care [facility]) - Progression Note    Patient Details  Name: Loretta Savage MRN: [id number] Date of Birth: 1965-11-30  [facility]) CM/SW Contact  Warden Buffa, Gardiner Rhyme, LCSW Phone Number: 03/20/2020, 8:56 AM  Clinical Narrative:   Met with Mom who reports pt is eating better and had a BM. She feels she is doing better and wants to still be aggressive with her care. She is concerned she has not been getting her mobic since she has been here and told the night RN about this. Will continue to work on best discharge plan for pt.  11:17 am Mom's brother is here and has questions and wants best for pt, but feels she is too much care for his sister. Discussed cleaning house out now while pt is here in the hospital. He wants to know if pt can go to hospice home and then in a few weeks go home. Discussed once admitted to hospice the pt is usually actively dying and if stays longer will need to explore other options. He wonders how much pt is actually eating here. Wanted to see MD have messaged him.  Expected Discharge Plan: Home/Self Care Barriers to Discharge: Continued Medical Work up  Expected Discharge Plan and Services Expected Discharge Plan: Home/Self Care In-house Referral: Clinical Social Work     Living arrangements for the past 2 months: Single Family Home                                       Social Determinants of Health (SDOH) Interventions    Readmission Risk Interventions No flowsheet data found.

## 2020-03-20 NOTE — Progress Notes (Signed)
[facility] Progress Note  Patient: Loretta Savage    [id number]  DOA: 03/06/2020     Date of Service: the patient was seen and examined on 03/20/2020  Chief Complaint  Patient presents with  . Emesis   Brief hospital course: Past medical history of cerebral palsy, GERD, anxiety, nonverbal at baseline.  Presented with intractable nausea and vomiting found to have candidal esophagitis confirmed on EGD.  Currently being treated with IV fluconazole.  Continues to have poor oral intake Currently further plan is supportive care arrange for outpatient hospice in a residential facility  Assessment and Plan: 1.  Candidal esophagitis. Intractable nausea and vomiting. Poor oral intake SP EGD. Positive considering budding yeast. Treated with IV Diflucan for now.  Patient unable to tolerate anything p.o. for now consistently. Continuing IV PPI as well. Currently day 11 of the treatment without any improvement in oral intake and with persistent tachycardia and pain leading to agitation. At present given ongoing symptoms it is unlikely that the patient will have any meaningful improvements in her oral intake going forward. During this time while we are waiting for improvement patient will remain at risk for recurrent infections including pneumonia and UTI and pressure ulcers. Given patient's poor nutritional status as well as dysphagia suggestion is to transition to hospice. Family initially agreeable for transition to hospice. On 03/19/2020 family reported the patient is drinking 4 Nepro shakes and suggest there is improvement in her oral intake.  2.  History of anxiety. Agitation. Acute encephalopathy Agitation and encephalopathy likely from delirium and pain Patient also has history of anxiety and uses Xanax. Currently concern is patient receiving too many psychotropic medication making her somnolent leading to poor p.o. intake. Difficult to balance patient's symptoms with ongoing  care requirement. IV as needed Haldol, scheduled Zyprexa for now. Pain control with IV morphine. Monitor response.  3.  Persistent sinus tachycardia with hypotension Patient was hypotensive likely from Cardura. Repeat CT abdomen pelvis to look for etiologies was negative for any acute abnormality on 03/15/2020. Chest x-ray on 03/16/2020 was also negative. Minimally elevated lactic acid normalizing very rapidly. D-dimer negative Procalcitonin negative suggesting no evidence of significant pneumonia. Blood pressure is improved after discontinuation of the Cardura. We will reduce IV fluid rate. Symptomatic support.  4.  History of bladder retention Patient was on Cardura for that. Currently it is discontinued due to hypotension.  Monitor.  5.  Recurrent UTI Patient was on prophylaxis methenamine. Currently on hold. Monitor  6.  Constipation. Bowel regimen initiated. Dulcolax suppository based on the CT scan.  7.  Dysphagia At risk due to combination of deconditioning as well as somnolence. Speech therapy consult appreciated. Currently on dysphagia 1 diet with nectar thick liquid. Minimize medications. We will liberalize the diet given the patient's prognosis now on comfort care.  8.  Goals of care discussion. Discussed with mother in detail regarding patient's poor prognosis given multiple comorbidities including dysphagia and esophagitis. At present recommendation is to focus more on comfort.   Had a repeat family meeting later in the afternoon of 03/17/2020.  Currently the goal is to transition to residential hospice with comfort care. Family would like to complete the course of the antibiotics which is supposed to be finished on 03/23/2020 Based on the discussion on 03/20/2020.  Mother would be okay with transitioning to comfort care and willing to residential hospice on discharge.  9.  Right upper extremity edema. We will check with for Doppler DVT.  10.  Suspected  partial  seizures. Patient started having tremors of her left upper extremity. During this patient is not awake and not able to follow any commands. Suspect that she may have some partial seizures. Ordered EEG. IV Ativan. We will monitor.  Diet: Dysphagia 1 diet DVT Prophylaxis: On comfort care.  Advance goals of care discussion: DNR/DNI on comfort care.  Family Communication: family was present at bedside, at the time of interview.  Opportunity was given to ask question and all questions were answered satisfactorily.   Disposition:  Status is: Inpatient  Remains inpatient appropriate because: Arrange for residential hospice   Dispo: The patient is from: Home              Anticipated d/c is to: Residential hospice              Anticipated d/c date is: 1              Patient currently is not medically stable to d/c.  Subjective: Remains sleepy and drowsy.  No nausea no vomiting.  No fever no chills.  Started to having tremors of her left upper extremity.  Physical Exam: General: drowsy lethargic, nonverbal at baseline, not oriented to time, place, and person.  Appear in mild distress, affect emotionally labile ENT: Oral Mucosa Clear, dry  Neck: difficult to assess  JVD,  Cardiovascular: S1 and S2 Present, no Murmur,  Respiratory: increased respiratory effort, Bilateral Air entry equal and Decreased, no Crackles, no wheezes Abdomen: Bowel Sound present, Soft and difficult to assess  tenderness,  Skin: no rash Extremities: no Pedal edema, no calf tenderness, right upper extremity edema Neurologic: Limited exam due to lack of cooperation of the patient Gait not checked due to patient safety concerns Left upper extremity tremors resolved after IV Ativan.  Vitals:   03/19/20 2337 03/20/20 0350 03/20/20 0800 03/20/20 1214  BP: 100/89 115/80 123/67 111/76  Pulse: (!) 124 (!) 109 97 (!) 110  Resp: 19 18 16 16   Temp: 99.1 F (37.3 C) 99.4 F (37.4 C) 99.7 F (37.6 C) 98.8 F (37.1 C)   TempSrc:    Oral  SpO2: 98% 98% 95% 95%  Weight:      Height:        Intake/Output Summary (Last 24 hours) at 03/20/2020 1946 Last data filed at 03/20/2020 0919 Gross per 24 hour  Intake --  Output 850 ml  Net -850 ml   Filed Weights   03/06/20 1417 03/18/20 2000 03/19/20 1938  Weight: 47.6 kg 47.6 kg 47.6 kg    Data Reviewed: I have personally reviewed and interpreted daily labs, tele strips, imagings as discussed above. I reviewed all nursing notes, pharmacy notes, vitals, pertinent old records I have discussed plan of care as described above with RN and patient/family.  CBC: Recent Labs  Lab 03/14/20 0846 03/15/20 1208 03/15/20 2219 03/19/20 1139  WBC 6.6 5.0 8.1 4.7  NEUTROABS  --  2.3 6.4 2.6  HGB 9.2* 9.8* 10.3* 9.1*  HCT 30.0* 31.5* 34.2* 29.6*  MCV 96.8 95.7 97.2 98.0  PLT 218 223 274 [id number]   Basic Metabolic Panel: Recent Labs  Lab 03/15/20 1208 03/15/20 2219 03/16/20 0703 03/17/20 1347 03/19/20 1139 03/20/20 1039  NA 139 140  --  142 141 140  K 3.4* 3.9  --  3.6 3.3* 3.6  CL 106 105  --  107 108 105  CO2 26 25  --  28 29 28   GLUCOSE 117* 147*  --  121* 106* 108*  BUN 5* 9  --  5* 7 5*  CREATININE 0.41* 0.64  --  0.42* <0.30* 0.37*  CALCIUM 8.5* 8.9  --  8.8* 8.3* 8.4*  MG 1.7 1.8 1.9 1.8  --  1.8  PHOS 2.2* 3.5  --  2.2*  --   --     Studies: No results found.  Scheduled Meds: . bisacodyl  10 mg Rectal BID  . Chlorhexidine Gluconate Cloth  6 each Topical Daily  . feeding supplement (NEPRO CARB STEADY)  237 mL Oral TID WC & HS  . fluconazole  200 mg Oral Daily  . fluticasone  2 spray Each Nare Daily  . magic mouthwash  10 mL Oral QID   And  . lidocaine  10 mL Mouth/Throat QID  . LORazepam      . OLANZapine zydis  2.5 mg Oral QHS  . pantoprazole sodium  40 mg Oral BID  . sodium chloride flush  10-40 mL Intracatheter Q12H  . sucralfate  1 g Oral TID WC & HS   Continuous Infusions: . dextrose 5% lactated ringers 50 mL/hr at 03/18/20 0624    PRN Meds: acetaminophen **OR** acetaminophen, haloperidol lactate, LORazepam, LORazepam, morphine injection, morphine CONCENTRATE, ondansetron **OR** ondansetron (ZOFRAN) IV, phenol, sodium chloride flush  Time spent: 35 minutes  Author: Berle Mull, MD [facility] 03/20/2020 7:46 PM  To reach On-call, see care teams to locate the attending and reach out to them via [email or web address]. If 7PM-7AM, please contact night-coverage If you still have difficulty reaching the attending provider, please page the [facility] (Director on Call) for [facility] on amion for assistance.

## 2020-03-20 NOTE — Care Management Important Message (Signed)
Important Message  Patient Details  Name: Loretta Savage MRN: [id number] Date of Birth: Nov 09, 1965   Medicare Important Message Given:  Yes     Shelbie Ammons, RN 03/20/2020, 1:27 PM

## 2020-03-20 NOTE — Progress Notes (Signed)
[facility]) Hospital Liaison RN note:  Received request from Dr. Berle Mull for family interest in Hospice home. Chart reviewed.  Will follow up tomorrow with family and patient regarding referral after palliative team has seen.  Please call for any hospice related questions or concerns.  Thank you. Zandra Abts, RN [facility] Liaison  [phone]

## 2020-03-20 NOTE — TOC Progression Note (Signed)
Transition of Care (TOC) - Progression Note    Patient Details  Name: Loretta Savage MRN: [id number] Date of Birth: 05/18/1966  Transition of Care (TOC) CM/SW Contact  ,  G, LCSW Phone Number: 03/20/2020, 1:47 PM  Clinical Narrative:  Met with Mom who does confirm pt is comfort care and she is agreeable to have her go to hospice home-will contact [facility] to have tracy talk with her and work on transfer once medically ready. Mom reports having a EEG for seizures. Continue to work on and provide support to Mom this is very difficult for her.       Expected Discharge Plan: Home/Self Care Barriers to Discharge: Continued Medical Work up  Expected Discharge Plan and Services Expected Discharge Plan: Home/Self Care In-house Referral: Clinical Social Work     Living arrangements for the past 2 months: Single Family Home                                       Social Determinants of Health (SDOH) Interventions    Readmission Risk Interventions No flowsheet data found.  

## 2020-03-20 NOTE — Progress Notes (Signed)
SLP Cancellation Note  Patient Details Name: Loretta Savage MRN: [id number] DOB: [birth year]   Cancelled treatment:       Reason Eval/Treat Not Completed: (chart reviewed). Per CM/SW notes, Mom confirmed that pt is comfort care, and she is agreeable to have pt go to hospice home when medically ready. Pt remains on a dysphagia diet and is only taking bites/sips w/ Mom or staff. Pt would benefit from continuing a Dysphagia level 1 (puree) w/ Nectar liquids w/ aspiration precautions for safer oral intake when she does take po's in light of pt's Cognitive decline and extended illness, weakness.  NSG/MD to reconsult ST services if any new needs arise while admitted.    Orinda Kenner, MS, CCC-SLP Trannie Bardales 03/20/2020, 4:29 PM

## 2020-03-21 ENCOUNTER — Other Ambulatory Visit: Payer: Medicare Other

## 2020-03-21 LAB — SARS CORONAVIRUS 2 BY RT PCR (HOSPITAL ORDER, PERFORMED IN ~~LOC~~ HOSPITAL LAB): SARS Coronavirus 2: POSITIVE — AB

## 2020-03-21 NOTE — TOC Progression Note (Signed)
Transition of Care [facility]) - Progression Note    Patient Details  Name: Loretta Savage MRN: [id number] Date of Birth: May 17, 1966  Transition of Care [facility]) CM/SW Contact  Takara Sermons, Gardiner Rhyme, LCSW Phone Number: 03/21/2020, 8:48 AM  Clinical Narrative:  [facility] have asked for COVID test. Have contacted [facility] to order a rapid she will place the order.      Expected Discharge Plan: Home/Self Care Barriers to Discharge: Continued Medical Work up  Expected Discharge Plan and Services Expected Discharge Plan: Home/Self Care In-house Referral: Clinical Social Work     Living arrangements for the past 2 months: Single Family Home                                       Social Determinants of Health (SDOH) Interventions    Readmission Risk Interventions No flowsheet data found.

## 2020-03-21 NOTE — Progress Notes (Addendum)
[facility] Progress Note  Patient: Loretta Savage    [id number]  DOA: 03/06/2020     Date of Service: the patient was seen and examined on 03/21/2020  Chief Complaint  Patient presents with  . Emesis   Brief hospital course: Past medical history of cerebral palsy, GERD, anxiety, nonverbal at baseline.  Presented with intractable nausea and vomiting found to have candidal esophagitis confirmed on EGD.  Currently being treated with fluconazole.  Continues to have poor oral intake Currently further plan is supportive care arrange for outpatient hospice in a residential facility  Assessment and Plan: 1.  Candidal esophagitis. Intractable nausea and vomiting. Poor oral intake SP EGD. Positive considering budding yeast. Treated with IV Diflucan for now. Continuing IV PPI as well. without any improvement in oral intake and with persistent tachycardia and pain leading to agitation. At present given ongoing symptoms it is unlikely that the patient will have any meaningful improvements in her oral intake going forward. During this time while we are waiting for improvement patient will remain at risk for recurrent infections including aspiration pneumonia and UTI and pressure ulcers. Given patient's poor nutritional status as well as dysphagia suggestion is to transition to hospice. Mother currently agreeable for transition to hospice.  2.  History of anxiety. Agitation. Acute encephalopathy Agitation and encephalopathy likely from delirium and pain Patient also has history of anxiety and uses Xanax. Currently concern is patient receiving too many psychotropic medication making her somnolent leading to poor p.o. intake. Difficult to balance patient's symptoms with ongoing care requirement. IV as needed Haldol, scheduled Zyprexa for now. Pain control with IV morphine. Monitor response.  3.  Persistent sinus tachycardia with hypotension Patient was hypotensive likely from  Cardura. Repeat CT abdomen pelvis to look for etiologies was negative for any acute abnormality on 03/15/2020. Chest x-ray on 03/16/2020 was also negative. Minimally elevated lactic acid normalizing very rapidly. D-dimer negative Procalcitonin negative suggesting no evidence of significant pneumonia. Blood pressure is improved after discontinuation of the Cardura. Symptomatic support.  4.  History of bladder retention Patient was on Cardura for that. Currently it is discontinued due to hypotension.  Monitor.  5.  Recurrent UTI Patient was on prophylaxis methenamine. Currently on hold. Monitor  6.  Constipation. Bowel regimen initiated. Dulcolax suppository based on the CT scan.  7.  Dysphagia At risk due to combination of deconditioning as well as somnolence. Speech therapy consult appreciated. Currently on dysphagia 1 diet with nectar thick liquid. Minimize medications.  8.  Goals of care discussion. Discussed with mother in detail regarding patient's poor prognosis given multiple comorbidities including dysphagia and esophagitis. At present recommendation is to focus more on comfort.   Had a repeat family meeting later in the afternoon of 03/17/2020.  Currently the goal is to transition to residential hospice with comfort care. Family would like to complete the course of the antibiotics which is supposed to be finished on 03/23/2020 Based on the discussion on 03/20/2020.  Mother would be okay with transitioning to comfort care and willing to residential hospice on discharge.  9.  Right upper extremity edema.  Doppler negative for DVT.  10.  Suspected partial seizures. Patient started having tremors of her left upper extremity on 03/20/2020 with unresponsiveness During this patient is not awake and not able to follow any commands. Suspect that she may have some partial seizures. Responded to IV Ativan.  Continue as needed Ativan We will monitor.  11.  Positive SARS coronavirus  RT-PCR. Patient  had positive RT-PCR on April 2021. RT-PCR performed for hospice transition was also positive on 03/21/2020. Currently does not have any symptoms of Covid does not have fever chest x-ray is clear therefore I do not think that the patient requires an isolation or has active Covid.  Diet: Dysphagia 1 diet DVT Prophylaxis: On comfort care.  Advance goals of care discussion: DNR/DNI on comfort care.  Family Communication: family was present at bedside, at the time of interview.  Opportunity was given to ask question and all questions were answered satisfactorily.   Disposition:  Status is: Inpatient  Remains inpatient appropriate because: Arrange for residential hospice   Dispo: The patient is from: Home              Anticipated d/c is to: Residential hospice              Anticipated d/c date is: 1              Patient currently is not medically stable to d/c.  Subjective: Remains comfortable.  No nausea no vomiting.  Physical Exam: General: drowsy lethargic, nonverbal at baseline, not oriented to time, place, and person.  Appear in mild distress, affect emotionally labile ENT: Oral Mucosa Clear, dry  Neck: difficult to assess  JVD,  Cardiovascular: S1 and S2 Present, no Murmur,  Respiratory: increased respiratory effort, Bilateral Air entry equal and Decreased, no Crackles, no wheezes Abdomen: Bowel Sound present, Soft and difficult to assess  tenderness,  Skin: no rash Extremities: no Pedal edema, no calf tenderness, right upper extremity edema Neurologic: Limited exam due to lack of cooperation of the patient Gait not checked due to patient safety concerns  Vitals:   03/20/20 0800 03/20/20 1214 03/21/20 0543 03/21/20 0753  BP: 123/67 111/76 (!) 101/59 99/61  Pulse: 97 (!) 110 91 (!) 115  Resp: 16 16 15 20   Temp: 99.7 F (37.6 C) 98.8 F (37.1 C) 98.7 F (37.1 C) 98.8 F (37.1 C)  TempSrc:  Oral  Oral  SpO2: 95% 95% 99% 98%  Weight:      Height:         Intake/Output Summary (Last 24 hours) at 03/21/2020 1942 Last data filed at 03/21/2020 1028 Gross per 24 hour  Intake 1582.92 ml  Output --  Net 1582.92 ml   Filed Weights   03/06/20 1417 03/18/20 2000 03/19/20 1938  Weight: 47.6 kg 47.6 kg 47.6 kg    Data Reviewed: I have personally reviewed and interpreted daily labs, tele strips, imagings as discussed above. I reviewed all nursing notes, pharmacy notes, vitals, pertinent old records I have discussed plan of care as described above with RN and patient/family.  CBC: Recent Labs  Lab 03/15/20 1208 03/15/20 2219 03/19/20 1139  WBC 5.0 8.1 4.7  NEUTROABS 2.3 6.4 2.6  HGB 9.8* 10.3* 9.1*  HCT 31.5* 34.2* 29.6*  MCV 95.7 97.2 98.0  PLT 223 274 [id number]   Basic Metabolic Panel: Recent Labs  Lab 03/15/20 1208 03/15/20 2219 03/16/20 0703 03/17/20 1347 03/19/20 1139 03/20/20 1039  NA 139 140  --  142 141 140  K 3.4* 3.9  --  3.6 3.3* 3.6  CL 106 105  --  107 108 105  CO2 26 25  --  28 29 28   GLUCOSE 117* 147*  --  121* 106* 108*  BUN 5* 9  --  5* 7 5*  CREATININE 0.41* 0.64  --  0.42* <0.30* 0.37*  CALCIUM 8.5* 8.9  --  8.8*  8.3* 8.4*  MG 1.7 1.8 1.9 1.8  --  1.8  PHOS 2.2* 3.5  --  2.2*  --   --     Studies: No results found.  Scheduled Meds: . bisacodyl  10 mg Rectal BID  . Chlorhexidine Gluconate Cloth  6 each Topical Daily  . feeding supplement (NEPRO CARB STEADY)  237 mL Oral TID WC & HS  . fluconazole  200 mg Oral Daily  . fluticasone  2 spray Each Nare Daily  . magic mouthwash  10 mL Oral QID   And  . lidocaine  10 mL Mouth/Throat QID  . OLANZapine zydis  2.5 mg Oral QHS  . pantoprazole sodium  40 mg Oral BID  . sodium chloride flush  10-40 mL Intracatheter Q12H  . sucralfate  1 g Oral TID WC & HS   Continuous Infusions:  PRN Meds: acetaminophen **OR** acetaminophen, haloperidol lactate, LORazepam, LORazepam, morphine injection, morphine CONCENTRATE, ondansetron **OR** ondansetron (ZOFRAN) IV,  phenol, sodium chloride flush  Time spent: 35 minutes  Author: Berle Mull, MD [facility] 03/21/2020 7:42 PM  To reach On-call, see care teams to locate the attending and reach out to them via [email or web address]. If 7PM-7AM, please contact night-coverage If you still have difficulty reaching the attending provider, please page the [facility] (Director on Call) for [facility] on amion for assistance.

## 2020-03-21 NOTE — Plan of Care (Addendum)
PMT note:  Per staff and hospice, there is confusion if patient is now comfort care or proceeding with aggressive care. Patient is resting in bed, no family at bedside. Attempted x2 to reach mother, unable to leave a VM at the number provided. Automated message of memory is full at the end of a series of rings. Will continue attempts.

## 2020-03-22 DIAGNOSIS — G800 Spastic quadriplegic cerebral palsy: Secondary | ICD-10-CM

## 2020-03-22 NOTE — Progress Notes (Signed)
[facility] hospital liaison note: New referral for [facility] hospice home received from Palliative NP Crystal Griffin, TOC Becky Dupree is aware.  Patient information has been sent to referral and patient has been approved for admission by hospice physician Dr. Betsy Kosek. There is currently no bed available today.  Writer met in the room with patient's mother [name] to initiate education regarding hospice services, philosophy, team approach to care and current visitation pl;oicy with understanding voiced. Questions answered, emotional support given. Aoi appeared to be very uncomfortable at the beginning of the visit, staff RN Wanette notified and patient did receive PRN pain medication which was effective. [name] is aware there is not currently a bed available today.  Writer will continue to follow and update family and hospital care team. Thank you for the opportunity to be involved in the care of this patient and her family. Karen Robertson BSN, RN, CHPN Hospital Liaison [facility] [phone] 

## 2020-03-22 NOTE — Progress Notes (Signed)
Daily Progress Note   Patient Name: Loretta Savage       Date: 03/22/2020 DOB: Mar 21, 1966  Age: 54 y.o. MRN#: [id number] Attending Physician: Allie Bossier, MD Primary Care Physician: Olin Hauser, DO Admit Date: 03/06/2020  Reason for Consultation/Follow-up: Establishing goals of care  Subjective: Patient is resting in bed. Mother is at bedside. She states her daughter is drinking Nepro, but is only drinking about 2 oz of water per day. She states she understands her daughter needs comfort based care and made arrangements with the doctor for that over the weekend; she does not want her daughter to suffer. She states "I had always hoped Loretta Savage would just have a heart attack", something quick and final where she would not have to make decisions. She states she would like to move her daughter to the hospice facility. She is aware life prolonging measures including IV fluids will not be provided with comfort base care, and is at peace with this.   Length of Stay: 15  Current Medications: Scheduled Meds:  . bisacodyl  10 mg Rectal BID  . Chlorhexidine Gluconate Cloth  6 each Topical Daily  . feeding supplement (NEPRO CARB STEADY)  237 mL Oral TID WC & HS  . fluconazole  200 mg Oral Daily  . fluticasone  2 spray Each Nare Daily  . magic mouthwash  10 mL Oral QID   And  . lidocaine  10 mL Mouth/Throat QID  . OLANZapine zydis  2.5 mg Oral QHS  . pantoprazole sodium  40 mg Oral BID  . sodium chloride flush  10-40 mL Intracatheter Q12H  . sucralfate  1 g Oral TID WC & HS    Continuous Infusions:   PRN Meds: acetaminophen **OR** acetaminophen, haloperidol lactate, LORazepam, LORazepam, morphine injection, morphine CONCENTRATE, ondansetron **OR** ondansetron (ZOFRAN) IV, phenol,  sodium chloride flush  Physical Exam Vitals and nursing note reviewed.  Constitutional:      Comments: Blinking eyes. Moaning.   Pulmonary:     Effort: Pulmonary effort is normal. No tachypnea, accessory muscle usage or respiratory distress.  Skin:    General: Skin is warm and dry.  Neurological:     Comments: Baseline non-verbal with CP/MR.             Vital Signs: BP 110/66 (BP Location: Right  Arm)   Pulse (!) 101   Temp 98.7 F (37.1 C) (Oral)   Resp 18   Ht 4\' 5"  (1.346 m)   Wt 47.6 kg   LMP  (LMP Unknown)   SpO2 97%   BMI 26.28 kg/m  SpO2: SpO2: 97 % O2 Device: O2 Device: Room Air O2 Flow Rate: O2 Flow Rate (L/min): 0 L/min  Intake/output summary:   Intake/Output Summary (Last 24 hours) at 03/22/2020 1103 Last data filed at 03/22/2020 0542 Gross per 24 hour  Intake --  Output 650 ml  Net -650 ml   LBM: Last BM Date: 03/21/20 Baseline Weight: Weight: 47.6 kg Most recent weight: Weight: 47.6 kg       Palliative Assessment/Data: PPS 30%    Flowsheet Rows     Most Recent Value  Intake Tab  Referral Department  Hospitalist  Unit at Time of Referral  Med/Surg Unit  Date Notified  03/10/20  Palliative Care Type  New Palliative care  Reason for referral  Clarify Goals of Care  Date of Admission  03/06/20  Date first seen by Palliative Care  03/13/20  # of days Palliative referral response time  3 Day(s)  # of days IP prior to Palliative referral  4  Clinical Assessment  Psychosocial & Spiritual Assessment  Palliative Care Outcomes      Patient Active Problem List   Diagnosis Date Noted  . Cerebral palsy ([place])   . Palliative care by specialist   . Goals of care, counseling/discussion   . Iron deficiency anemia due to chronic blood loss 03/11/2020  . Hypophosphatemia 03/11/2020  . Esophagitis 03/11/2020  . Esophageal candidiasis ([place]) 03/11/2020  . Hypotension 03/11/2020  . Sinus tachycardia 03/11/2020  . Hypomagnesemia 03/11/2020  . Intractable  nausea and vomiting 03/06/2020  . Scoliosis 11/23/2019  . Bowel and bladder incontinence 11/23/2019  . 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 Anxiety 02/04/2018  . Insomnia 02/04/2018  . 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 GERD (gastroesophageal reflux disease) 02/04/2018  . 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 Seasonal allergies 02/04/2018  . Chronic interstitial cystitis 02/04/2018    Palliative Care Assessment & Plan   Patient Profile: 54 y.o. female  with past medical history of cerebral palsy, GERD, anxiety admitted on 03/06/2020 with intractable nausea and vomiting. GI following. EGD performed and found to have esophagitis with bleeding complicated by esophageal candidiasis. Medium sized hiatal hernia. On IV PPI and IV Diflucan. Outpatient GI recommended with repeat EGD in 2 months. Course of hospitalization complicated by poor oral intake, aspiration risk, and persistent tachycardia. Palliative medicine consultation for goals of care.   Recommendations/Plan: Plans to transfer to hospice facility. Comfort based care.       Code Status: FULL   Code Status Orders  (From admission, onward)         Start     Ordered   03/06/20 2200  Full code  Continuous     03/06/20 2207        Code Status History    This patient has a current code status but no historical code status.   Advance Care Planning Activity    Advance Directive Documentation     Most Recent Value  Type of Advance Directive  Healthcare Power of Attorney  Pre-existing out of facility DNR order (yellow form or pink MOST form)  --  "MOST" Form in Place?  --  Prognosis: < 2 weeks without IV fluids and feeding tube. Drinking Nepro but no water. Refusing oral meds.   Discharge Planning:  To Be Determined   Thank you for allowing the Palliative Medicine Team to assist in the care of this  patient. Greater than 50% of this time was spent counseling and coordinating care related to the above assessment and plan.    Total Time 35 min Prolonged Time Billed  no         Please contact Palliative Medicine Team phone at [phone] for questions and concerns.

## 2020-03-22 NOTE — TOC Progression Note (Signed)
Transition of Care [facility]) - Progression Note    Patient Details  Name: Loretta Savage MRN: [id number] Date of Birth: Jun 29, 1966  [facility] CM/SW Contact  Wilfred Siverson, Gardiner Rhyme, LCSW Phone Number: 03/22/2020, 10:22 AM  Clinical Narrative:   Working on getting pt to the hospice home. According to [name] there are no beds today. [name] talking with Mom this am, confirming plan. Will keep all updated when bed available.    Expected Discharge Plan: Home/Self Care Barriers to Discharge: Continued Medical Work up  Expected Discharge Plan and Services Expected Discharge Plan: Home/Self Care In-house Referral: Clinical Social Work     Living arrangements for the past 2 months: Single Family Home                                       Social Determinants of Health (SDOH) Interventions    Readmission Risk Interventions No flowsheet data found.

## 2020-03-22 NOTE — Progress Notes (Signed)
PROGRESS NOTE    Loretta Savage  [id number] DOB: 1966-07-10 DOA: 03/06/2020 PCP: Olin Hauser, DO     Brief Narrative:  54 yo WF Past medical history of cerebral palsy, GERD, anxiety, nonverbal at baseline.  Presented with intractable nausea and vomiting found to have candidal esophagitis confirmed on EGD.  Currently being treated with fluconazole.  Continues to have poor oral intake Currently further plan is supportive care arrange for outpatient hospice in a residential facility   Subjective: Eyes open, does not answer questions.  Nonverbal does not follow commands   Assessment & Plan:   Active Problems:   Intractable nausea and vomiting   Iron deficiency anemia due to chronic blood loss   Hypophosphatemia   Esophagitis   Esophageal candidiasis (HCC)   Hypotension   Sinus tachycardia   Hypomagnesemia   Cerebral palsy ([place])   Palliative care by specialist   Goals of care, counseling/discussion   1.  Candidal esophagitis. Intractable nausea and vomiting. Poor oral intake SP EGD. Positive considering budding yeast. Treated with IV Diflucan for now. Continuing IV PPI as well. without any improvement in oral intake and with persistent tachycardia and pain leading to agitation. At present given ongoing symptoms it is unlikely that the patient will have any meaningful improvements in her oral intake going forward. During this time while we are waiting for improvement patient will remain at risk for recurrent infections including aspiration pneumonia and UTI and pressure ulcers. Given patient's poor nutritional status as well as dysphagia suggestion is to transition to hospice. Mother currently agreeable for transition to hospice.  2.  History of anxiety. Agitation. Acute encephalopathy Agitation and encephalopathy likely from delirium and pain Patient also has history of anxiety and uses Xanax. Currently concern is patient receiving too many psychotropic  medication making her somnolent leading to poor p.o. intake. Difficult to balance patient's symptoms with ongoing care requirement. IV as needed Haldol, scheduled Zyprexa for now. Pain control with IV morphine. Monitor response.  3.  Persistent sinus tachycardia with hypotension Patient was hypotensive likely from Cardura. Repeat CT abdomen pelvis to look for etiologies was negative for any acute abnormality on 03/15/2020. Chest x-ray on 03/16/2020 was also negative. Minimally elevated lactic acid normalizing very rapidly. D-dimer negative Procalcitonin negative suggesting no evidence of significant pneumonia. Blood pressure is improved after discontinuation of the Cardura. Symptomatic support.  4.  History of bladder retention Patient was on Cardura for that. Currently it is discontinued due to hypotension.  Monitor.  5.  Recurrent UTI Patient was on prophylaxis methenamine. Currently on hold. Monitor  6.  Constipation. Bowel regimen initiated. Dulcolax suppository based on the CT scan.  7.  Dysphagia At risk due to combination of deconditioning as well as somnolence. Speech therapy consult appreciated. Currently on dysphagia 1 diet with nectar thick liquid. Minimize medications.  8.  Goals of care discussion. Discussed with mother in detail regarding patient's poor prognosis given multiple comorbidities including dysphagia and esophagitis. At present recommendation is to focus more on comfort.   Had a repeat family meeting later in the afternoon of 03/17/2020.  Currently the goal is to transition to residential hospice with comfort care. Family would like to complete the course of the antibiotics which is supposed to be finished on 03/23/2020 Based on the discussion on 03/20/2020.  Mother would be okay with transitioning to comfort care and willing to residential hospice on discharge.  9.  Right upper extremity edema.  Doppler negative for DVT.  10.  Suspected  partial seizures. Patient started having tremors of her left upper extremity on 03/20/2020 with unresponsiveness During this patient is not awake and not able to follow any commands. Suspect that she may have some partial seizures. Responded to IV Ativan.  Continue as needed Ativan We will monitor.  11.  Positive SARS coronavirus RT-PCR. Patient had positive RT-PCR on April 2021. RT-PCR performed for hospice transition was also positive on 03/21/2020. Currently does not have any symptoms of Covid does not have fever chest x-ray is clear therefore I do not think that the patient requires an isolation or has active Covid.   Goals of care; 5/26 COMFORT CARE   DVT prophylaxis:  Code Status: Comfort care Family Communication: Mother at bedside Disposition Plan:  Status is: Inpatient    Dispo: The patient is from: Home              Anticipated d/c is to: Residential hospice              Anticipated d/c date is: Unknown              Patient currently unstable      Consultants:  Palliative care  Procedures/Significant Events:    I have personally reviewed and interpreted all radiology studies and my findings are as above.  VENTILATOR SETTINGS:    Cultures   Antimicrobials:    Devices    LINES / TUBES:      Continuous Infusions:   Objective: Vitals:   03/21/20 0753 03/21/20 2352 03/22/20 0725 03/22/20 1724  BP: 99/61 114/76 110/66 121/82  Pulse: (!) 115 (!) 120 (!) 101 (!) 105  Resp: 20 16 18    Temp: 98.8 F (37.1 C) 100.2 F (37.9 C) 98.7 F (37.1 C) 99.2 F (37.3 C)  TempSrc: Oral  Oral Oral  SpO2: 98% 98% 97% 99%  Weight:      Height:        Intake/Output Summary (Last 24 hours) at 03/22/2020 2010 Last data filed at 03/22/2020 1414 Gross per 24 hour  Intake 120 ml  Output 650 ml  Net -530 ml   Filed Weights   03/06/20 1417 03/18/20 2000 03/19/20 1938  Weight: 47.6 kg 47.6 kg 47.6 kg    Examination:  General: No acute respiratory  distress Eyes: negative scleral hemorrhage, negative anisocoria, negative icterus ENT: Negative Runny nose, negative gingival bleeding, Neck:  Negative scars, masses, torticollis, lymphadenopathy, JVD Lungs: Clear to auscultation bilaterally without wheezes or crackles Cardiovascular: Regular rate and rhythm without murmur gallop or rub normal S1 and S2 Abdomen: negative abdominal pain, nondistended, positive soft, bowel sounds, no rebound, no ascites, no appreciable mass Extremities: No significant cyanosis, clubbing, or edema bilateral lower extremities Skin: Negative rashes, lesions, ulcers Psychiatric: Unable to evaluate Central nervous system: Unable to evaluate .     Data Reviewed: Care during the described time interval was provided by me .  I have reviewed this patient's available data, including medical history, events of note, physical examination, and all test results as part of my evaluation.  CBC: Recent Labs  Lab 03/15/20 2219 03/19/20 1139  WBC 8.1 4.7  NEUTROABS 6.4 2.6  HGB 10.3* 9.1*  HCT 34.2* 29.6*  MCV 97.2 98.0  PLT 274 [id number]   Basic Metabolic Panel: Recent Labs  Lab 03/15/20 2219 03/16/20 0703 03/17/20 1347 03/19/20 1139 03/20/20 1039  NA 140  --  142 141 140  K 3.9  --  3.6 3.3* 3.6  CL 105  --  107 108 105  CO2 25  --  28 29 28   GLUCOSE 147*  --  121* 106* 108*  BUN 9  --  5* 7 5*  CREATININE 0.64  --  0.42* <0.30* 0.37*  CALCIUM 8.9  --  8.8* 8.3* 8.4*  MG 1.8 1.9 1.8  --  1.8  PHOS 3.5  --  2.2*  --   --    GFR: Estimated Creatinine Clearance: 47.7 mL/min (A) (by C-G formula based on SCr of 0.37 mg/dL (L)). Liver Function Tests: Recent Labs  Lab 03/17/20 1347 03/19/20 1139  AST 27 24  ALT 19 16  ALKPHOS 56 54  BILITOT 0.5 0.4  PROT 6.0* 5.5*  ALBUMIN 3.0* 2.6*   No results for input(s): LIPASE, AMYLASE in the last 168 hours. No results for input(s): AMMONIA in the last 168 hours. Coagulation Profile: No results for input(s):  INR, PROTIME in the last 168 hours. Cardiac Enzymes: No results for input(s): CKTOTAL, CKMB, CKMBINDEX, TROPONINI in the last 168 hours. BNP (last 3 results) No results for input(s): PROBNP in the last 8760 hours. HbA1C: No results for input(s): HGBA1C in the last 72 hours. CBG: Recent Labs  Lab 03/15/20 2129  GLUCAP 140*   Lipid Profile: No results for input(s): CHOL, HDL, LDLCALC, TRIG, CHOLHDL, LDLDIRECT in the last 72 hours. Thyroid Function Tests: No results for input(s): TSH, T4TOTAL, FREET4, T3FREE, THYROIDAB in the last 72 hours. Anemia Panel: No results for input(s): VITAMINB12, FOLATE, FERRITIN, TIBC, IRON, RETICCTPCT in the last 72 hours. Sepsis Labs: Recent Labs  Lab 03/15/20 2219 03/16/20 0210  PROCALCITON <0.10  --   LATICACIDVEN 3.0* 1.5    Recent Results (from the past 240 hour(s))  CULTURE, BLOOD (ROUTINE X 2) w Reflex to ID Panel     Status: None   Collection Time: 03/13/20  9:40 AM   Specimen: BLOOD  Result Value Ref Range Status   Specimen Description BLOOD BLOOD LEFT ARM  Final   Special Requests   Final    BOTTLES DRAWN AEROBIC AND ANAEROBIC Blood Culture adequate volume   Culture   Final    NO GROWTH 5 DAYS Performed at [facility], [address] [place], [place] [address]    Report Status 03/18/2020 FINAL  Final  CULTURE, BLOOD (ROUTINE X 2) w Reflex to ID Panel     Status: None   Collection Time: 03/13/20  9:43 AM   Specimen: BLOOD  Result Value Ref Range Status   Specimen Description BLOOD BLOOD RIGHT ARM  Final   Special Requests   Final    BOTTLES DRAWN AEROBIC AND ANAEROBIC Blood Culture adequate volume   Culture   Final    NO GROWTH 5 DAYS Performed at [facility], [address] [place], [place] [address]    Report Status 03/18/2020 FINAL  Final  SARS Coronavirus 2 by RT PCR (hospital order, performed in [place] hospital lab) Nasopharyngeal Nasopharyngeal Swab     Status: Abnormal   Collection Time:  03/21/20  1:36 PM   Specimen: Nasopharyngeal Swab  Result Value Ref Range Status   SARS Coronavirus 2 POSITIVE (A) NEGATIVE Final    Comment: RESULT CALLED TO, READ BACK BY AND VERIFIED WITH: WYNETTE MILES RN AT [id number] ON 03/21/20 SNG (NOTE) SARS-CoV-2 target nucleic acids are DETECTED SARS-CoV-2 RNA is generally detectable in upper respiratory specimens  during the acute phase of infection.  Positive results are indicative  of the presence of the identified virus,  but do not rule out bacterial infection or co-infection with other pathogens not detected by the test.  Clinical correlation with patient history and  other diagnostic information is necessary to determine patient infection status.  The expected result is negative. Fact Sheet for Patients:   StrictlyIdeas.no  Fact Sheet for Healthcare Providers:   BankingDealers.co.za   This test is not yet approved or cleared by the [place] FDA and  has been authorized for detection and/or diagnosis of SARS-CoV-2 by FDA under an Emergency Use Authorization (EUA).  This EUA will remain in effect (meaning this test  can be used) for the duration of  the COVID-19 declaration under Section 564(b)(1) of the Act, 21 U.S.C. section 360-bbb-3(b)(1), unless the authorization is terminated or revoked sooner. Performed at [facility], [address], [place], [place] [address]          Radiology Studies: No results found.      Scheduled Meds: . bisacodyl  10 mg Rectal BID  . Chlorhexidine Gluconate Cloth  6 each Topical Daily  . feeding supplement (NEPRO CARB STEADY)  237 mL Oral TID WC & HS  . fluconazole  200 mg Oral Daily  . fluticasone  2 spray Each Nare Daily  . magic mouthwash  10 mL Oral QID   And  . lidocaine  10 mL Mouth/Throat QID  . OLANZapine zydis  2.5 mg Oral QHS  . pantoprazole sodium  40 mg Oral BID  . sodium chloride flush  10-40 mL Intracatheter Q12H    . sucralfate  1 g Oral TID WC & HS   Continuous Infusions:   LOS: 15 days    Time spent:40 min    Jeffory Snelgrove, Geraldo Docker, MD [facility] Pager [phone]  If 7PM-7AM, please contact night-coverage [email or web address] Password [email or web address] 03/22/2020, 8:10 PM

## 2020-03-23 DIAGNOSIS — I950 Idiopathic hypotension: Secondary | ICD-10-CM

## 2020-03-23 MED ORDER — LORAZEPAM 2 MG/ML IJ SOLN: 0.5000 mg | INTRAMUSCULAR | 0 refills | Status: AC | PRN

## 2020-03-23 MED ORDER — MORPHINE SULFATE (PF) 2 MG/ML IV SOLN: 2.0000 mg | INTRAVENOUS | 0 refills | Status: AC | PRN

## 2020-03-23 MED ORDER — BISACODYL 10 MG RE SUPP: 10.0000 mg | Freq: Two times a day (BID) | RECTAL | 0 refills | Status: AC

## 2020-03-23 MED ORDER — SUCRALFATE 1 GM/10ML PO SUSP: 1.0000 g | Freq: Three times a day (TID) | ORAL | 0 refills | Status: AC

## 2020-03-23 MED ORDER — FLUCONAZOLE 40 MG/ML PO SUSR: 200.0000 mg | Freq: Every day | ORAL | 0 refills | Status: AC

## 2020-03-23 MED ORDER — HALOPERIDOL LACTATE 5 MG/ML IJ SOLN: 2.0000 mg | Freq: Four times a day (QID) | INTRAMUSCULAR | 0 refills | Status: AC | PRN

## 2020-03-23 MED ORDER — PANTOPRAZOLE SODIUM 40 MG PO PACK: 40.0000 mg | PACK | Freq: Two times a day (BID) | ORAL | 0 refills | Status: AC

## 2020-03-23 MED ORDER — ACETAMINOPHEN 650 MG RE SUPP: 650.0000 mg | Freq: Four times a day (QID) | RECTAL | 0 refills | Status: AC | PRN

## 2020-03-23 MED ORDER — LORAZEPAM 2 MG/ML IJ SOLN: 1.0000 mg | INTRAMUSCULAR | 0 refills | Status: AC | PRN

## 2020-03-23 MED ORDER — MORPHINE SULFATE (CONCENTRATE) 10 MG/0.5ML PO SOLN: 5.0000 mg | ORAL | 0 refills | Status: AC | PRN

## 2020-03-23 MED ORDER — FLUTICASONE PROPIONATE 50 MCG/ACT NA SUSP: 2.0000 | Freq: Every day | NASAL | 0 refills | Status: AC

## 2020-03-23 MED ORDER — OLANZAPINE 5 MG PO TBDP: 2.5000 mg | ORAL_TABLET | Freq: Every day | ORAL | 0 refills | Status: AC

## 2020-03-23 NOTE — TOC Transition Note (Signed)
Transition of Care [facility]) - CM/SW Discharge Note   Patient Details  Name: Loretta Savage MRN: [id number] Date of Birth: 22-Aug-1966  Transition of [facility] CM/SW Contact:  Elease Hashimoto, LCSW Phone Number: 03/23/2020, 9:50 AM   Clinical Narrative:   Met with Mom who is here and is aware of hospice bed for today. She reports she has to go to [facility] to get a COVID test so she can go to her MD appointment tomorrow. She then will go to the hospice home and sign the papers for pt's admission. Awaiting DC paperwork for transfer and then will call EMS for transport. Bedside RN aware of plan. MD has completed paperwork and when bedside RN ready will call EMS    Final next level of care: [place] Barriers to Discharge: Barriers Resolved   Patient Goals and CMS Choice Patient states their goals for this hospitalization and ongoing recovery are:: Mom answers for pt since she is non-speaker. Mom states: " I want to know what is causing this vomiting." CMS Medicare.gov Compare Post Acute Care list provided to:: Legal Guardian Choice offered to / list presented to : Parent  Discharge Placement                Patient to be transferred to facility by: EMS Name of family member notified: [name]-Mom Patient and family notified of of transfer: 03/23/20  Discharge Plan and Services In-house Referral: Clinical Social Work                                   Social Determinants of Health ([place]) Interventions     Readmission Risk Interventions No flowsheet data found.

## 2020-03-23 NOTE — Discharge Summary (Signed)
Physician Discharge Summary  Loretta Savage [id number] DOB: 19-Nov-1965 DOA: 03/06/2020  PCP: Olin Hauser, DO  Admit date: 03/06/2020 Discharge date: 03/23/2020  Time spent: 30 minutes  Recommendations for Outpatient Follow-up:   1. Candidal esophagitis. Intractable nausea and vomiting. Poor oral intake SP EGD. Positive considering budding yeast. Treated with IV Diflucan for now. Continuing IV PPI as well. without any improvement in oral intake and with persistent tachycardia and pain leading to agitation. Given patient's poor nutritional status as well as dysphagia suggestion is to transition to hospice. Mother currentlyagreeable for transition to hospice.  Mother felt appropriate to transition to hospice care  2. History of anxiety. Agitation. Acute encephalopathy Agitation and encephalopathy likely from delirium and pain Patient also has history of anxiety and uses Xanax. Difficult to balance patient's symptoms with ongoing care requirement. IV as needed Haldol, scheduled Zyprexa for now. Pain control with IV morphine. Monitor response.  3. Persistent sinus tachycardia with hypotension Patient was hypotensive likely from Cardura. Repeat CT abdomen pelvis to look for etiologies was negative for any acute abnormality on 03/15/2020. Chest x-ray on 03/16/2020 was also negative. Minimally elevated lactic acid normalizing very rapidly. D-dimer negative Procalcitonin negative suggesting no evidence of significant pneumonia. Blood pressure is improved after discontinuation of the Cardura. Symptomatic support.  4. History of bladder retention Patient was on Cardura for that. Currently it is discontinued due to hypotension. .  5. Recurrent UTI Patient was on prophylaxis methenamine. Patient now comfort care  6. Constipation. Bowel regimen initiated. Dulcolax suppository based on the CT scan.  7. Dysphagia At risk due to combination of  deconditioning as well as somnolence. Speech therapy consult appreciated. Currently on dysphagia 1 diet with nectar thick liquid. Comfort care  8. Goals of care discussion. Discussed with mother in detail regarding patient's poor prognosis given multiple comorbidities including dysphagia and esophagitis. At present recommendation is to focus more on comfort.  Had a repeat family meeting later in the afternoon of 03/17/2020. Currently the goal is to transition to residential hospice with comfort care. Family would like to complete the course of the antibiotics which is supposed to be finished on 03/23/2020 Based on the discussion on 03/20/2020.  Comfort care  9. Right upper extremity edema. Dopplernegative forDVT.  10. Suspected partial seizures. Patient started having tremors of her left upper extremityon 03/20/2020 with unresponsiveness During this patient is not awake and not able to follow any commands. Suspect that she may have some partial seizures. Responded toIV Ativan.Continue as needed Ativan Comfort care  11.Positive SARS coronavirus RT-PCR. Patient had positive RT-PCR on April 2021. RT-PCR performed for hospice transition was also positive on 03/21/2020. Currently does not have any symptoms of Covid does not have fever chest x-ray is clear therefore I do not think that the patient requires an isolation or has active Covid.   Discharge Diagnoses:  Active Problems:   Intractable nausea and vomiting   Iron deficiency anemia due to chronic blood loss   Hypophosphatemia   Esophagitis   Esophageal candidiasis (HCC)   Hypotension   Sinus tachycardia   Hypomagnesemia   Cerebral palsy ([place])   Palliative care by specialist   Goals of care, counseling/discussion   Discharge Condition: Guarded  Diet recommendation: Dysphagia 1, fluid nectar thick  Filed Weights   03/06/20 1417 03/18/20 2000 03/19/20 1938  Weight: 47.6 kg 47.6 kg 47.6 kg    History of  present illness:  54 yo WF Past medical history of cerebral palsy, GERD, anxiety,  nonverbal at baseline. Presented with intractable nausea and vomiting found to have candidal esophagitis confirmed on EGD. Currently being treated with fluconazole. Continues to have poor oral intake Currently further plan is supportive care arrange for outpatient hospice in a residential facility  Hospital Course:  See above   Antibiotics Anti-infectives (From admission, onward)   Start     Dose/Rate Stop   03/20/20 1400  fluconazole (DIFLUCAN) 40 MG/ML suspension 200 mg     200 mg 03/24/20 0959   03/09/20 1400  fluconazole (DIFLUCAN) IVPB 200 mg  Status:  Discontinued    Note to Pharmacy: We will switch to oral once patient will start taking oral medications   200 mg 100 mL/hr over 60 Minutes 03/20/20 1203   03/07/20 0800  methenamine (MANDELAMINE) tablet 1 g  Status:  Discontinued     1 g 03/15/20 1148       Discharge Exam: Vitals:   03/22/20 0725 03/22/20 1724 03/23/20 0004 03/23/20 0716  BP: 110/66 121/82 105/69 117/61  Pulse: (!) 101 (!) 105 (!) 102 (!) 103  Resp: 18  16 15   Temp: 98.7 F (37.1 C) 99.2 F (37.3 C) 99.5 F (37.5 C) 99.1 F (37.3 C)  TempSrc: Oral Oral Oral Oral  SpO2: 97% 99% 98% 99%  Weight:      Height:        General: No acute respiratory distress Eyes: negative scleral hemorrhage, negative anisocoria, negative icterus ENT: Negative Runny nose, negative gingival bleeding, Neck:  Negative scars, masses, torticollis, lymphadenopathy, JVD Lungs: Clear to auscultation bilaterally without wheezes or crackles Cardiovascular: Regular rate and rhythm without murmur gallop or rub normal S1 and S2  Discharge Instructions   Allergies as of 03/23/2020      Reactions   Neomycin-bacitracin Zn-polymyx    Other reaction(s): HIVES   Hydrocortisone Rash   Levofloxacin Rash   Other reaction(s): SWELLING Other reaction(s): SWELLING Other reaction(s): SWELLING Other  reaction(s): SWELLING   Penicillins Rash   Other reaction(s): HIVES Other reaction(s): HIVES Other reaction(s): HIVES Other reaction(s): HIVES      Medication List    STOP taking these medications   ALPRAZolam 0.5 MG tablet Commonly known as: XANAX   Colace Clear 50 MG capsule Generic drug: docusate sodium   Dexilant 30 MG capsule Generic drug: Dexlansoprazole   doxazosin 2 MG tablet Commonly known as: CARDURA   levocetirizine 5 MG tablet Commonly known as: XYZAL   lidocaine-prilocaine cream Commonly known as: EMLA   loratadine 10 MG tablet Commonly known as: CLARITIN   methenamine 1 g tablet Commonly known as: HIPREX   pantoprazole 20 MG tablet Commonly known as: Protonix Replaced by: pantoprazole sodium 40 mg/20 mL Pack   promethazine 12.5 MG suppository Commonly known as: PHENERGAN   sucralfate 1 g tablet Commonly known as: CARAFATE Replaced by: sucralfate 1 GM/10ML suspension   traZODone 50 MG tablet Commonly known as: DESYREL     TAKE these medications   acetaminophen 500 MG tablet Commonly known as: TYLENOL Take 1,000 mg by mouth every 6 (six) hours as needed. What changed: Another medication with the same name was added. Make sure you understand how and when to take each.   acetaminophen 650 MG suppository Commonly known as: TYLENOL Place 1 suppository (650 mg total) rectally every 6 (six) hours as needed for mild pain (or Fever >/= 101). What changed: You were already taking a medication with the same name, and this prescription was added. Make sure you understand how and when  to take each.   aspirin 81 MG EC tablet Take 81 mg by mouth daily.   bisacodyl 10 MG suppository Commonly known as: DULCOLAX Place 1 suppository (10 mg total) rectally 2 (two) times daily. What changed:   when to take this  reasons to take this   Calcium-Vitamin D 600-200 MG-UNIT tablet Take 1 tablet by mouth daily.   D2000 Ultra Strength 50 MCG (2000 UT)  Caps Generic drug: Cholecalciferol Take 1 capsule by mouth daily.   fluconazole 40 MG/ML suspension Commonly known as: DIFLUCAN Take 5 mLs (200 mg total) by mouth daily for 1 day.   fluticasone 50 MCG/ACT nasal spray Commonly known as: FLONASE Place 2 sprays into both nostrils daily. Start taking on: Mar 24, 2020 What changed: See the new instructions.   haloperidol lactate 5 MG/ML injection Commonly known as: HALDOL Inject 0.4 mLs (2 mg total) into the vein every 6 (six) hours as needed.   LORazepam 2 MG/ML injection Commonly known as: ATIVAN Inject 0.25 mLs (0.5 mg total) into the vein every 4 (four) hours as needed for anxiety.   LORazepam 2 MG/ML injection Commonly known as: ATIVAN Inject 0.5-1 mLs (1-2 mg total) into the vein every 4 (four) hours as needed for seizure.   meloxicam 15 MG tablet Commonly known as: MOBIC TAKE 1 TABLET BY MOUTH DAILY FOR ARTHRITIS   morphine 2 MG/ML injection Inject 1 mL (2 mg total) into the vein every 2 (two) hours as needed.   morphine CONCENTRATE 10 MG/0.5ML Soln concentrated solution Take 0.25 mLs (5 mg total) by mouth every 2 (two) hours as needed for moderate pain.   OLANZapine zydis 5 MG disintegrating tablet Commonly known as: ZYPREXA Take 0.5 tablets (2.5 mg total) by mouth at bedtime.   ondansetron 4 MG disintegrating tablet Commonly known as: Zofran ODT Take 1-2 tablets (4-8 mg total) by mouth every 8 (eight) hours as needed for nausea or vomiting. What changed: Another medication with the same name was removed. Continue taking this medication, and follow the directions you see here.   pantoprazole sodium 40 mg/20 mL Pack Commonly known as: PROTONIX Take 20 mLs (40 mg total) by mouth 2 (two) times daily. Replaces: pantoprazole 20 MG tablet   sucralfate 1 GM/10ML suspension Commonly known as: CARAFATE Take 10 mLs (1 g total) by mouth 4 (four) times daily -  with meals and at bedtime. Replaces: sucralfate 1 g tablet    traMADol 50 MG tablet Commonly known as: ULTRAM Take 50 mg by mouth daily as needed.      Allergies  Allergen Reactions  . Neomycin-Bacitracin Zn-Polymyx     Other reaction(s): HIVES   . Hydrocortisone Rash  . Levofloxacin Rash    Other reaction(s): SWELLING Other reaction(s): SWELLING Other reaction(s): SWELLING Other reaction(s): SWELLING   . Penicillins Rash    Other reaction(s): HIVES Other reaction(s): HIVES Other reaction(s): HIVES Other reaction(s): HIVES       The results of significant diagnostics from this hospitalization (including imaging, microbiology, ancillary and laboratory) are listed below for reference.    Significant Diagnostic Studies: DG Chest 1 View  Result Date: 03/15/2020 CLINICAL DATA:  54 year old female with tachycardia. EXAM: CHEST  1 VIEW COMPARISON:  Portable chest 03/13/2020 and earlier. FINDINGS: Portable AP semi upright view at 2142 hours. Chronic scoliosis with lower thoracic and lumbar posterior spinal rods. Stable borderline to mild cardiomegaly. Other mediastinal contours are within normal limits. Visualized tracheal air column is within normal limits. Allowing for portable technique  the lungs are clear. No pneumothorax. Paucity of bowel gas in the upper abdomen. No acute osseous abnormality identified. IMPRESSION: No acute cardiopulmonary abnormality. Electronically Signed   By: Genevie Ann M.D.   On: 03/15/2020 21:51   DG Chest 1 View  Result Date: 03/11/2020 CLINICAL DATA:  Sepsis EXAM: CHEST  1 VIEW COMPARISON:  This 03/02/2020 FINDINGS: The heart size and mediastinal contours are within normal limits. Both lungs are clear. Dextroscoliosis with incompletely visualized spinal rods. Gas distended colon is visible in the upper abdomen. IMPRESSION: No active cardiopulmonary disease. Electronically Signed   By: Ulyses Jarred M.D.   On: 03/11/2020 01:30   DG Chest 1 View  Result Date: 03/02/2020 CLINICAL DATA:  Cough, vomiting EXAM: CHEST  1  VIEW COMPARISON:  02/13/2020, 02/25/2020 FINDINGS: Single frontal view of the chest demonstrates stable spinal fusion rods. Continued right convex scoliosis. Cardiac silhouette is unchanged. No airspace disease, effusion, or pneumothorax. Chronic degenerative changes right shoulder. No acute fractures. IMPRESSION: 1. No acute intrathoracic process. Electronically Signed   By: Randa Ngo M.D.   On: 03/02/2020 17:25   DG Abdomen 1 View  Result Date: 03/06/2020 CLINICAL DATA:  Nausea and vomiting EXAM: ABDOMEN - 1 VIEW COMPARISON:  02/13/2020 FINDINGS: Postsurgical changes are noted in the thoracolumbar spine with stable scoliosis concave to the right at the thoracolumbar junction. Scattered large and small bowel gas is noted. No obstructive changes are seen. No free air is noted. No abnormal mass or abnormal calcifications are seen. IMPRESSION: No acute abnormality noted. Electronically Signed   By: Inez Catalina M.D.   On: 03/06/2020 19:20   CT ABDOMEN PELVIS W CONTRAST  Result Date: 03/15/2020 CLINICAL DATA:  Abdominal pain, intractable nausea and vomiting, candidal esophagitis EXAM: CT ABDOMEN AND PELVIS WITH CONTRAST TECHNIQUE: Multidetector CT imaging of the abdomen and pelvis was performed using the standard protocol following bolus administration of intravenous contrast. CONTRAST:  10mL OMNIPAQUE IOHEXOL 300 MG/ML  SOLN COMPARISON:  03/02/2020 FINDINGS: Lower chest: No acute abnormality. Small hiatal hernia, relatively reduced on current examination. Unchanged scarring or atelectasis of the left lung base. Hepatobiliary: No solid liver abnormality is seen. No gallstones, gallbladder wall thickening, or biliary dilatation. Pancreas: Unremarkable. No pancreatic ductal dilatation or surrounding inflammatory changes. Spleen: Normal in size without significant abnormality. Adrenals/Urinary Tract: Adrenal glands are unremarkable. Kidneys are normal, without renal calculi, solid lesion, or hydronephrosis.  Bladder is unremarkable. Stomach/Bowel: Stomach is within normal limits. Appendix appears normal. No evidence of bowel wall thickening, distention, or inflammatory changes. Dense stool balls in the rectum. Vascular/Lymphatic: No significant vascular findings are present. No enlarged abdominal or pelvic lymph nodes. Reproductive: No mass or other significant abnormality. Other: No abdominal wall hernia or abnormality. No abdominopelvic ascites. Musculoskeletal: No acute or significant osseous findings. Extensive posterior thoracolumbar fusion. IMPRESSION: 1. No acute CT findings of the abdomen or pelvis to explain pain or vomiting. 2. Small hiatal hernia, relatively reduced on current examination. Electronically Signed   By: Eddie Candle M.D.   On: 03/15/2020 13:44   CT ABDOMEN PELVIS W CONTRAST  Result Date: 03/02/2020 CLINICAL DATA:  Nausea and vomiting EXAM: CT ABDOMEN AND PELVIS WITH CONTRAST TECHNIQUE: Multidetector CT imaging of the abdomen and pelvis was performed using the standard protocol following bolus administration of intravenous contrast. CONTRAST:  56mL OMNIPAQUE IOHEXOL 300 MG/ML  SOLN COMPARISON:  CT 02/25/2020, 08/03/2014 FINDINGS: Lower chest: Lung bases demonstrate atelectasis at the left base. No consolidation or pleural effusion. Moderate hiatal hernia Hepatobiliary:  Central hepatic cysts. No calcified gallstone or biliary dilatation. Pancreas: Unremarkable. No pancreatic ductal dilatation or surrounding inflammatory changes. Spleen: Normal in size without focal abnormality. Adrenals/Urinary Tract: Adrenal glands are unremarkable. Kidneys are normal, without renal calculi, focal lesion, or hydronephrosis. Bladder is unremarkable. Stomach/Bowel: Stomach is within normal limits. Appendix not clearly identified but no right lower quadrant inflammatory process. No evidence of bowel wall thickening, distention, or inflammatory changes. Vascular/Lymphatic: Nonaneurysmal aorta. No suspicious  adenopathy. Reproductive: No adnexal mass. Other: Negative for free air or free fluid. Musculoskeletal: Scoliosis of the spine with thoracolumbar spinal rods. Bilateral acetabular dysplasia. No acute osseous abnormality. IMPRESSION: 1. No CT evidence for acute intra-abdominal or pelvic abnormality. 2. Moderate hiatal hernia. Electronically Signed   By: Donavan Foil M.D.   On: 03/02/2020 17:48   CT ABDOMEN PELVIS W CONTRAST  Result Date: 02/25/2020 CLINICAL DATA:  Nausea and vomiting with acute abdominal pain EXAM: CT ABDOMEN AND PELVIS WITH CONTRAST TECHNIQUE: Multidetector CT imaging of the abdomen and pelvis was performed using the standard protocol following bolus administration of intravenous contrast. CONTRAST:  10mL OMNIPAQUE IOHEXOL 300 MG/ML  SOLN COMPARISON:  08/03/2014 FINDINGS: Lower chest:  Moderate sliding hiatal hernia Hepatobiliary: Small cystic density in the central liver.No evidence of biliary obstruction or stone. Pancreas: Unremarkable. Spleen: Unremarkable. Adrenals/Urinary Tract: Negative adrenals. No hydronephrosis or stone. Unremarkable bladder. Stomach/Bowel: No obstruction. No appendicitis. Moderate desiccated stool, but nonobstructive. Vascular/Lymphatic: No acute vascular abnormality. No mass or adenopathy. Reproductive:No pathologic findings. Other: No ascites or pneumoperitoneum. Musculoskeletal: No acute abnormalities. Scoliosis and hip dysplasia. Marked muscular atrophy. No acute osseous finding. IMPRESSION: 1. No acute finding. Negative for bowel obstruction or visible inflammation. 2. Hiatal hernia. Electronically Signed   By: Monte Fantasia M.D.   On: 02/25/2020 04:14   US Venous Img Upper Uni Right(DVT)  Result Date: 03/19/2020 CLINICAL DATA:  54 year old with right upper extremity edema. EXAM: RIGHT UPPER EXTREMITY VENOUS DOPPLER ULTRASOUND TECHNIQUE: Gray-scale sonography with graded compression, as well as color Doppler and duplex ultrasound were performed to evaluate  the upper extremity deep venous system from the level of the subclavian vein and including the jugular, axillary, basilic, radial, ulnar and upper cephalic vein. Spectral Doppler was utilized to evaluate flow at rest and with distal augmentation maneuvers. COMPARISON:  None. FINDINGS: Contralateral Subclavian Vein: Color Doppler flow in the left subclavian vein. Echogenic tubular structure within the vein and reportedly the patient has a PICC line on the left side. Internal Jugular Vein: No evidence of thrombus. Normal compressibility, color Doppler flow and phasicity. Subclavian Vein: No evidence of thrombus. Normal compressibility, respiratory phasicity and response to augmentation. Axillary Vein: No evidence of thrombus. Normal compressibility, respiratory phasicity and response to augmentation. Cephalic Vein: No evidence of thrombus. Normal compressibility, respiratory phasicity and response to augmentation. Basilic Vein: No evidence of thrombus. Normal compressibility, respiratory phasicity and response to augmentation. Brachial Veins: No evidence of thrombus. Normal compressibility, respiratory phasicity and response to augmentation. Radial Veins: No evidence of thrombus. Normal compressibility and color Doppler flow. Ulnar Veins: No evidence of thrombus. Normal compressibility and color Doppler flow. Other Findings:  None visualized. IMPRESSION: No evidence of DVT within the right upper extremity. Electronically Signed   By: Markus Daft M.D.   On: 03/19/2020 18:38   DG Chest Port 1 View  Result Date: 03/13/2020 CLINICAL DATA:  Cough and fevers EXAM: PORTABLE CHEST 1 VIEW COMPARISON:  03/11/2020 FINDINGS: Cardiac shadow is enlarged but stable. The overall inspiratory effort has improved. No focal  confluent infiltrate is seen. Scoliosis with prior fixation rods is noted. IMPRESSION: Overall improved aeration.  No focal infiltrate is noted. Electronically Signed   By: Inez Catalina M.D.   On: 03/13/2020 08:31    DG Abd Portable 1V  Result Date: 03/12/2020 CLINICAL DATA:  Tachycardia, nausea. EXAM: PORTABLE ABDOMEN - 1 VIEW COMPARISON:  Plain film of the abdomen dated 03/06/2020. FINDINGS: Overall bowel gas pattern is nonobstructive. No evidence of soft tissue mass or abnormal fluid collection. No evidence of free intraperitoneal air. No evidence of renal or ureteral calculi. Fixation hardware seen traversing the scoliotic thoracolumbar spine, stable. IMPRESSION: No acute findings. Nonobstructive bowel gas pattern. Electronically Signed   By: Franki Cabot M.D.   On: 03/12/2020 08:43   [place] EKG SITE RITE  Result Date: 03/15/2020 If Site Rite image not attached, placement could not be confirmed due to current cardiac rhythm.  US Abdomen Limited RUQ  Result Date: 03/06/2020 CLINICAL DATA:  Vomiting for 1 month EXAM: ULTRASOUND ABDOMEN LIMITED RIGHT UPPER QUADRANT COMPARISON:  03/02/2020 FINDINGS: Gallbladder: No gallstones or wall thickening visualized. No sonographic Murphy sign noted by sonographer. Common bile duct: Diameter: 2 mm (assessment is however markedly limited by patient body habitus. Area felt to represent the common bile duct is measured and is anterior to portal venous structures. These areas are not well evaluated given limitations of the current exam. Liver: Small cysts in the liver are similar to recent CT evaluation. The liver is incompletely assessed due to overlying: Along the RIGHT hepatic lobe, bowel gas in the midline also limiting assessment of midline structures. Portal vein is patent on color Doppler imaging with normal direction of blood flow towards the liver. Again assessment of the porta hepatis is limited by body habitus. Other: No ascites. IMPRESSION: 1. Limited evaluation due to body habitus and overlying bowel gas as described. 2. Within the limitations of the current study no ultrasound evidence of acute cholecystitis. Electronically Signed   By: Zetta Bills M.D.   On:  03/06/2020 17:49    Microbiology: Recent Results (from the past 240 hour(s))  SARS Coronavirus 2 by RT PCR (hospital order, performed in [facility] hospital lab) Nasopharyngeal Nasopharyngeal Swab     Status: Abnormal   Collection Time: 03/21/20  1:36 PM   Specimen: Nasopharyngeal Swab  Result Value Ref Range Status   SARS Coronavirus 2 POSITIVE (A) NEGATIVE Final    Comment: RESULT CALLED TO, READ BACK BY AND VERIFIED WITH: WYNETTE MILES RN AT [id number] ON 03/21/20 SNG (NOTE) SARS-CoV-2 target nucleic acids are DETECTED SARS-CoV-2 RNA is generally detectable in upper respiratory specimens  during the acute phase of infection.  Positive results are indicative  of the presence of the identified virus, but do not rule out bacterial infection or co-infection with other pathogens not detected by the test.  Clinical correlation with patient history and  other diagnostic information is necessary to determine patient infection status.  The expected result is negative. Fact Sheet for Patients:   StrictlyIdeas.no  Fact Sheet for Healthcare Providers:   BankingDealers.co.za   This test is not yet approved or cleared by the [place] FDA and  has been authorized for detection and/or diagnosis of SARS-CoV-2 by FDA under an Emergency Use Authorization (EUA).  This EUA will remain in effect (meaning this test  can be used) for the duration of  the COVID-19 declaration under Section 564(b)(1) of the Act, 21 U.S.C. section 360-bbb-3(b)(1), unless the authorization is terminated or revoked sooner. Performed  at [facility], [address] [place], [place] [address]      Labs: Basic Metabolic Panel: Recent Labs  Lab 03/17/20 1347 03/19/20 1139 03/20/20 1039  NA 142 141 140  K 3.6 3.3* 3.6  CL 107 108 105  CO2 28 29 28   GLUCOSE 121* 106* 108*  BUN 5* 7 5*  CREATININE 0.42* <0.30* 0.37*  CALCIUM 8.8* 8.3* 8.4*  MG 1.8  --  1.8   PHOS 2.2*  --   --    Liver Function Tests: Recent Labs  Lab 03/17/20 1347 03/19/20 1139  AST 27 24  ALT 19 16  ALKPHOS 56 54  BILITOT 0.5 0.4  PROT 6.0* 5.5*  ALBUMIN 3.0* 2.6*   No results for input(s): LIPASE, AMYLASE in the last 168 hours. No results for input(s): AMMONIA in the last 168 hours. CBC: Recent Labs  Lab 03/19/20 1139  WBC 4.7  NEUTROABS 2.6  HGB 9.1*  HCT 29.6*  MCV 98.0  PLT 222   Cardiac Enzymes: No results for input(s): CKTOTAL, CKMB, CKMBINDEX, TROPONINI in the last 168 hours. BNP: BNP (last 3 results) Recent Labs    03/16/20 0703  BNP 36.9    ProBNP (last 3 results) No results for input(s): PROBNP in the last 8760 hours.  CBG: No results for input(s): GLUCAP in the last 168 hours.     Signed:  Dia Crawford, MD [facility] [phone] pager

## 2020-03-23 NOTE — Plan of Care (Signed)
  Problem: Clinical Measurements: Goal: Ability to maintain clinical measurements within normal limits will improve Outcome: Progressing Goal: Will remain free from infection Outcome: Progressing Goal: Cardiovascular complication will be avoided Outcome: Progressing   Problem: Nutrition: Goal: Adequate nutrition will be maintained Outcome: Progressing   Problem: Elimination: Goal: Will not experience complications related to urinary retention Outcome: Progressing   Problem: Pain Managment: Goal: General experience of comfort will improve Outcome: Progressing

## 2020-03-23 NOTE — TOC Progression Note (Signed)
Transition of Care [facility]) - Progression Note    Patient Details  Name: Loretta Savage MRN: [id number] Date of Birth: 1966/02/08  Transition of Care [facility]) CM/SW Contact  Azreal Stthomas, Gardiner Rhyme, LCSW Phone Number: 03/23/2020, 9:38 AM  Clinical Narrative:   Hospice has a bed for pt today. Will need MD to do DC summary and sign DNR. Mom is in the room and [facility] will speak with regarding paperwork to be completed. Will call EMS once paperwork completed and ready to transfer.    Expected Discharge Plan: Home/Self Care Barriers to Discharge: Continued Medical Work up  Expected Discharge Plan and Services Expected Discharge Plan: Home/Self Care In-house Referral: Clinical Social Work     Living arrangements for the past 2 months: Single Family Home                                       Social Determinants of Health (SDOH) Interventions    Readmission Risk Interventions No flowsheet data found.

## 2020-03-23 NOTE — Plan of Care (Signed)
  Problem: Clinical Measurements: Goal: Ability to maintain clinical measurements within normal limits will improve 03/23/2020 1052 by Claiborne Billings, RN Outcome: Adequate for Discharge 03/23/2020 0929 by Claiborne Billings, RN Outcome: Progressing Goal: Will remain free from infection 03/23/2020 1052 by Claiborne Billings, RN Outcome: Adequate for Discharge 03/23/2020 0929 by Claiborne Billings, RN Outcome: Progressing Goal: Cardiovascular complication will be avoided 03/23/2020 1052 by Claiborne Billings, RN Outcome: Adequate for Discharge 03/23/2020 0929 by Claiborne Billings, RN Outcome: Progressing

## 2020-03-23 NOTE — Progress Notes (Signed)
Patient to be discharged to hospice, AVS printed and in packet. Report has been called. Guardian aware.

## 2020-03-23 NOTE — Care Management Important Message (Signed)
Important Message  Patient Details  Name: Loretta Savage MRN: [id number] Date of Birth: December 18, 1965   Medicare Important Message Given:  No  Patient discharged prior to arrival to unit to deliver concurrent Medicare IM.   Dannette Barbara 03/23/2020, 3:27 PM

## 2020-04-27 DEATH — deceased

## 2020-05-10 ENCOUNTER — Other Ambulatory Visit: Payer: Self-pay | Admitting: Family Medicine

## 2020-05-10 DIAGNOSIS — N3010 Interstitial cystitis (chronic) without hematuria: Secondary | ICD-10-CM
